# Patient Record
Sex: Female | Born: 1980 | Race: Black or African American | Hispanic: No | Marital: Single | Smoking: Former smoker
Health system: Southern US, Community
[De-identification: ages and names within clinical notes are randomized; demographics above are authoritative.]

## PROBLEM LIST (undated history)

## (undated) DIAGNOSIS — M545 Low back pain, unspecified: Secondary | ICD-10-CM

## (undated) DIAGNOSIS — R600 Localized edema: Secondary | ICD-10-CM

## (undated) DIAGNOSIS — F32A Depression, unspecified: Secondary | ICD-10-CM

## (undated) DIAGNOSIS — F1990 Other psychoactive substance use, unspecified, uncomplicated: Secondary | ICD-10-CM

## (undated) DIAGNOSIS — B019 Varicella without complication: Secondary | ICD-10-CM

## (undated) DIAGNOSIS — F329 Major depressive disorder, single episode, unspecified: Secondary | ICD-10-CM

## (undated) DIAGNOSIS — R0600 Dyspnea, unspecified: Secondary | ICD-10-CM

## (undated) DIAGNOSIS — M1990 Unspecified osteoarthritis, unspecified site: Secondary | ICD-10-CM

## (undated) DIAGNOSIS — I10 Essential (primary) hypertension: Secondary | ICD-10-CM

## (undated) DIAGNOSIS — K219 Gastro-esophageal reflux disease without esophagitis: Secondary | ICD-10-CM

## (undated) HISTORY — DX: Localized edema: R60.0

## (undated) HISTORY — DX: Depression, unspecified: F32.A

## (undated) HISTORY — DX: Essential (primary) hypertension: I10

## (undated) HISTORY — DX: Low back pain: M54.5

## (undated) HISTORY — DX: Low back pain, unspecified: M54.50

## (undated) HISTORY — DX: Unspecified osteoarthritis, unspecified site: M19.90

## (undated) HISTORY — DX: Dyspnea, unspecified: R06.00

## (undated) HISTORY — DX: Gastro-esophageal reflux disease without esophagitis: K21.9

## (undated) HISTORY — DX: Other psychoactive substance use, unspecified, uncomplicated: F19.90

## (undated) HISTORY — DX: Varicella without complication: B01.9

## (undated) HISTORY — DX: Major depressive disorder, single episode, unspecified: F32.9

---

## 2008-02-15 ENCOUNTER — Other Ambulatory Visit: Admission: RE | Admit: 2008-02-15 | Discharge: 2008-02-15 | Payer: Self-pay | Admitting: Family Medicine

## 2012-05-25 ENCOUNTER — Ambulatory Visit (INDEPENDENT_AMBULATORY_CARE_PROVIDER_SITE_OTHER): Payer: Medicare HMO | Admitting: Family Medicine

## 2012-05-25 ENCOUNTER — Encounter: Payer: Self-pay | Admitting: Family Medicine

## 2012-05-25 ENCOUNTER — Encounter: Payer: Self-pay | Admitting: *Deleted

## 2012-05-25 VITALS — BP 120/78 | HR 87 | Temp 98.4°F | Ht 66.0 in | Wt 221.0 lb

## 2012-05-25 DIAGNOSIS — Z0000 Encounter for general adult medical examination without abnormal findings: Secondary | ICD-10-CM | POA: Insufficient documentation

## 2012-05-25 DIAGNOSIS — G5710 Meralgia paresthetica, unspecified lower limb: Secondary | ICD-10-CM | POA: Insufficient documentation

## 2012-05-25 DIAGNOSIS — N920 Excessive and frequent menstruation with regular cycle: Secondary | ICD-10-CM | POA: Insufficient documentation

## 2012-05-25 DIAGNOSIS — G5712 Meralgia paresthetica, left lower limb: Secondary | ICD-10-CM

## 2012-05-25 DIAGNOSIS — Z309 Encounter for contraceptive management, unspecified: Secondary | ICD-10-CM

## 2012-05-25 DIAGNOSIS — B351 Tinea unguium: Secondary | ICD-10-CM

## 2012-05-25 DIAGNOSIS — IMO0001 Reserved for inherently not codable concepts without codable children: Secondary | ICD-10-CM

## 2012-05-25 LAB — CBC WITH DIFFERENTIAL/PLATELET
Basophils Relative: 0.4 % (ref 0.0–3.0)
Eosinophils Relative: 0.5 % (ref 0.0–5.0)
Hemoglobin: 14.2 g/dL (ref 12.0–15.0)
MCV: 90.2 fl (ref 78.0–100.0)
Monocytes Absolute: 0.6 10*3/uL (ref 0.1–1.0)
Neutro Abs: 6.5 10*3/uL (ref 1.4–7.7)
Neutrophils Relative %: 62.6 % (ref 43.0–77.0)
RBC: 4.62 Mil/uL (ref 3.87–5.11)
WBC: 10.4 10*3/uL (ref 4.5–10.5)

## 2012-05-25 LAB — HEPATIC FUNCTION PANEL
ALT: 17 U/L (ref 0–35)
AST: 21 U/L (ref 0–37)
Albumin: 4.2 g/dL (ref 3.5–5.2)

## 2012-05-25 LAB — BASIC METABOLIC PANEL
BUN: 13 mg/dL (ref 6–23)
Chloride: 105 mEq/L (ref 96–112)
Creatinine, Ser: 0.8 mg/dL (ref 0.4–1.2)
GFR: 84.7 mL/min (ref >60.00)
Glucose, Bld: 82 mg/dL (ref 70–99)
Potassium: 3.8 mEq/L (ref 3.5–5.1)

## 2012-05-25 LAB — POCT URINE PREGNANCY: Preg Test, Ur: NEGATIVE

## 2012-05-25 LAB — TSH: TSH: 0.83 u[IU]/mL (ref 0.35–5.50)

## 2012-05-25 MED ORDER — MEDROXYPROGESTERONE ACETATE 150 MG/ML IM SUSP
150.0000 mg | Freq: Once | INTRAMUSCULAR | Status: AC
  Administered 2012-05-25: 150 mg via INTRAMUSCULAR

## 2012-05-25 NOTE — Patient Instructions (Addendum)
Schedule your pap and breast exam at your convenience You're due for your next Depo 6/01-06-24 Take Calcium and Vit D supplement daily (Caltrate) We'll notify you of your lab results and make any changes if needed Restart prenatal vitamins for energy Start using FungiNail as directed on the bottle The numbness of your thigh is called Meralgia Paraesthetica and is due to compression of that nerve on the hip bone Welcome!  We're glad to have you!

## 2012-05-25 NOTE — Progress Notes (Signed)
  Subjective:    Patient ID: Jody Bryan, female    DOB: 06/22/1980, 32 y.o.   MRN: [id number]  HPI New to establish.  Previous MD- none  Here today for CPE.  Menorrhagia- new since giving birth.  Pt reports passing clots.  Denies increased frequency of menses, severe cramping.  Toenail fungus- new.  Pt reports R great toe nail is yellowed, splitting, very brittle.  No other nails involved.  Meralgia paraesthetica- new.  L lateral thigh.  No radiation of numbness.  Intermittent.  Started while pregnant.  Has not improved.     Review of Systems Patient reports no vision/ hearing changes, adenopathy,fever, weight change,  persistant/recurrent hoarseness , swallowing issues, chest pain, palpitations, edema, persistant/recurrent cough, hemoptysis, dyspnea (rest/exertional/paroxysmal nocturnal), gastrointestinal bleeding (melena, rectal bleeding), abdominal pain, significant heartburn, bowel changes, GU symptoms (dysuria, hematuria, incontinence), Gyn symptoms (abnormal  bleeding, pain),  syncope, focal weakness, memory loss, numbness & tingling, skin/hair/nail changes, abnormal bruising or bleeding, anxiety, or depression.     Objective:   Physical Exam General Appearance:    Alert, cooperative, no distress, appears stated age  Head:    Normocephalic, without obvious abnormality, atraumatic  Eyes:    PERRL, conjunctiva/corneas clear, EOM's intact, fundi    benign, both eyes  Ears:    Normal TM's and external ear canals, both ears  Nose:   Nares normal, septum midline, mucosa normal, no drainage    or sinus tenderness  Throat:   Lips, mucosa, and tongue normal; teeth and gums normal  Neck:   Supple, symmetrical, trachea midline, no adenopathy;    Thyroid: no enlargement/tenderness/nodules  Back:     Symmetric, no curvature, ROM normal, no CVA tenderness  Lungs:     Clear to auscultation bilaterally, respirations unlabored  Chest Wall:    No tenderness or deformity   Heart:    Regular  rate and rhythm, S1 and S2 normal, no murmur, rub   or gallop  Breast Exam:    Deferred to GYN  Abdomen:     Soft, non-tender, bowel sounds active all four quadrants,    no masses, no organomegaly  Genitalia:    Deferred to GYN  Rectal:    Extremities:   Extremities normal, atraumatic, no cyanosis or edema  Pulses:   2+ and symmetric all extremities  Skin:   Skin color, texture, turgor normal, no rashes or lesions  Lymph nodes:   Cervical, supraclavicular, and axillary nodes normal  Neurologic:   CNII-XII intact, normal strength, sensation and reflexes    throughout          Assessment & Plan:

## 2012-05-25 NOTE — Assessment & Plan Note (Signed)
New.  Reviewed dx.  Pt to start tx w/ OTC funginail.  Reviewed supportive care and red flags that should prompt return.  Pt expressed understanding and is in agreement w/ plan.

## 2012-05-25 NOTE — Assessment & Plan Note (Signed)
New.  Upreg negative.  Start Depo as pt requests.  Reviewed need for Ca+Vit D supplementation.

## 2012-05-25 NOTE — Assessment & Plan Note (Signed)
Pt's PE WNL w/ exception of obesity.  Check labs.  Anticipatory guidance provided.  
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
## 2012-05-25 NOTE — Assessment & Plan Note (Signed)
New.  Reviewed that this is not uncommon following pregnancy.  Will start Depo and this should improve pt's sxs.  Will follow.

## 2012-05-29 LAB — VITAMIN D 1,25 DIHYDROXY
Vitamin D 1, 25 (OH)2 Total: 76 pg/mL — ABNORMAL HIGH (ref 18–72)
Vitamin D3 1, 25 (OH)2: 76 pg/mL

## 2012-07-27 ENCOUNTER — Telehealth: Payer: Self-pay | Admitting: Family Medicine

## 2012-07-27 NOTE — Telephone Encounter (Signed)
Appointment Scheduled:  07/28/2012 16:00:00  Appointment Scheduled Provider:  Loreen Freud R.  (pt needed appt for 5/29 and this was the only open slot (that wasn't same day))

## 2012-07-27 NOTE — Telephone Encounter (Signed)
Patient Information:  Caller Name: Alexander  Phone: [phone]  Patient: Jody, Bryan  Gender: Female  DOB: 11/26/1980  Age: 32 Years  PCP: Sheliah Hatch  Pregnant: No  Office Follow Up:  Does the office need to follow up with this patient?: No  Instructions For The Office: N/A   Symptoms  Reason For Call & Symptoms: pt reports she has a varicose vein that has burst in her left leg.  pt has pain when it happened  Reviewed Health History In EMR: Yes  Reviewed Medications In EMR: Yes  Reviewed Allergies In EMR: Yes  Reviewed Surgeries / Procedures: Yes  Date of Onset of Symptoms: 07/25/2012 OB / GYN:  LMP: Unknown  Guideline(s) Used:  Leg Pain  Disposition Per Guideline:   See Today or Tomorrow in Office  Reason For Disposition Reached:   Localized pain, redness or hard lump along vein  Advice Given:  Call Back If:  You become worse.  Patient Will Follow Care Advice:  YES  Appointment Scheduled:  07/28/2012 16:00:00 Appointment Scheduled Provider:  Loreen Freud R.  (pt needed appt for 5/29 and this was the only open slot (that wasn't same day))

## 2012-07-28 ENCOUNTER — Ambulatory Visit (HOSPITAL_BASED_OUTPATIENT_CLINIC_OR_DEPARTMENT_OTHER)
Admission: RE | Admit: 2012-07-28 | Discharge: 2012-07-28 | Disposition: A | Discharge status: Home / Self Care | Payer: Managed Care, Other (non HMO) | Source: Ambulatory Visit | Attending: Family Medicine | Admitting: Family Medicine

## 2012-07-28 ENCOUNTER — Encounter: Payer: Self-pay | Admitting: Family Medicine

## 2012-07-28 ENCOUNTER — Ambulatory Visit (INDEPENDENT_AMBULATORY_CARE_PROVIDER_SITE_OTHER): Payer: Managed Care, Other (non HMO) | Admitting: Family Medicine

## 2012-07-28 VITALS — BP 120/80 | HR 87 | Temp 98.9°F | Wt 228.8 lb

## 2012-07-28 DIAGNOSIS — M79609 Pain in unspecified limb: Secondary | ICD-10-CM | POA: Insufficient documentation

## 2012-07-28 DIAGNOSIS — M79662 Pain in left lower leg: Secondary | ICD-10-CM

## 2012-07-28 DIAGNOSIS — I8390 Asymptomatic varicose veins of unspecified lower extremity: Secondary | ICD-10-CM | POA: Insufficient documentation

## 2012-07-28 NOTE — Patient Instructions (Signed)
Varicose Veins  Varicose veins are veins that have become enlarged and twisted.  CAUSES  This condition is the result of valves in the veins not working properly. Valves in the veins help return blood from the leg to the heart. If these valves are damaged, blood flows backwards and backs up into the veins in the leg near the skin. This causes the veins to become larger. People who are on their feet a lot, who are pregnant, or who are overweight are more likely to develop varicose veins.  SYMPTOMS   · Bulging, twisted-appearing, bluish veins, most commonly found on the legs.  · Leg pain or a feeling of heaviness. These symptoms may be worse at the end of the day.  · Leg swelling.  · Skin color changes.  DIAGNOSIS   Varicose veins can usually be diagnosed with an exam of your legs by your caregiver. He or she may recommend an ultrasound of your leg veins.  TREATMENT   Most varicose veins can be treated at home. However, other treatments are available for people who have persistent symptoms or who want to treat the cosmetic appearance of the varicose veins. These include:  · Laser treatment of very small varicose veins.  · Medicine that is shot (injected) into the vein. This medicine hardens the walls of the vein and closes off the vein. This treatment is called sclerotherapy. Afterwards, you may need to wear clothing or bandages that apply pressure.  · Surgery.  HOME CARE INSTRUCTIONS   · Do not stand or sit in one position for long periods of time. Do not sit with your legs crossed. Rest with your legs raised during the day.  · Wear elastic stockings or support hose. Do not wear other tight, encircling garments around the legs, pelvis, or waist.  · Walk as much as possible to increase blood flow.  · Raise the foot of your bed at night with 2-inch blocks.  · If you get a cut in the skin over the vein and the vein bleeds, lie down with your leg raised and press on it with a clean cloth until the bleeding stops. Then  place a bandage (dressing) on the cut. See your caregiver if it continues to bleed or needs stitches.  SEEK MEDICAL CARE IF:   · The skin around your ankle starts to break down.  · You have pain, redness, tenderness, or hard swelling developing in your leg over a vein.  · You are uncomfortable due to leg pain.  Document Released: 11/26/2004 Document Revised: 05/11/2011 Document Reviewed: 04/14/2010  [employer]® Patient Information ©2014 [employer].

## 2012-07-28 NOTE — Assessment & Plan Note (Signed)
Check doppler Elevate leg Warm compress

## 2012-07-28 NOTE — Progress Notes (Signed)
  Subjective:    Patient ID: Jody Bryan, female    DOB: 1980/08/15, 32 y.o.   MRN: [id number]  HPI Pt here c/o pain in L calf and ruptured blood vesssel.  No cp, no sob.    Review of Systems    as above  Objective:   Physical Exam  BP 120/80  Pulse 87  Temp(Src) 98.9 F (37.2 C) (Oral)  Wt 228 lb 12.8 oz (103.783 kg)  BMI 36.95 kg/m2  SpO2 98% General appearance: alert, cooperative, appears stated age and no distress Lungs: clear to auscultation bilaterally Heart: S1, S2 normal Extremities: no edema,  + echymosis l calf with some tenderness, no errythema       Assessment & Plan:

## 2012-08-01 ENCOUNTER — Telehealth: Payer: Self-pay | Admitting: Family Medicine

## 2012-08-01 NOTE — Telephone Encounter (Signed)
Patient is calling because she states she is returning a call from someone in our office on Friday. She says that it could have been in regards to her [name] that we sent her for but she is unsure.

## 2012-08-02 NOTE — Telephone Encounter (Signed)
Spoke with the pt and informed her of recent US results.  Pt understood.//AB/CMA

## 2012-08-15 ENCOUNTER — Ambulatory Visit: Payer: Managed Care, Other (non HMO) | Admitting: Family Medicine

## 2012-08-15 DIAGNOSIS — Z0289 Encounter for other administrative examinations: Secondary | ICD-10-CM

## 2012-08-23 ENCOUNTER — Ambulatory Visit (INDEPENDENT_AMBULATORY_CARE_PROVIDER_SITE_OTHER): Payer: Managed Care, Other (non HMO)

## 2012-08-23 DIAGNOSIS — Z309 Encounter for contraceptive management, unspecified: Secondary | ICD-10-CM

## 2012-08-23 MED ORDER — METHYLPREDNISOLONE ACETATE 80 MG/ML IJ SUSP: 80.0000 mg | Freq: Once | INTRAMUSCULAR | Status: DC

## 2012-08-24 MED ORDER — MEDROXYPROGESTERONE ACETATE 150 MG/ML IM SUSP
150.0000 mg | Freq: Once | INTRAMUSCULAR | Status: AC
  Administered 2012-08-23: 150 mg via INTRAMUSCULAR

## 2012-08-24 NOTE — Addendum Note (Signed)
Addended by: Arnette Norris on: 08/24/2012 08:06 AM   Modules accepted: Orders

## 2012-09-05 ENCOUNTER — Other Ambulatory Visit (HOSPITAL_COMMUNITY)
Admission: RE | Admit: 2012-09-05 | Discharge: 2012-09-05 | Disposition: A | Discharge status: Home / Self Care | Payer: Managed Care, Other (non HMO) | Source: Ambulatory Visit | Attending: Family Medicine | Admitting: Family Medicine

## 2012-09-05 ENCOUNTER — Ambulatory Visit (INDEPENDENT_AMBULATORY_CARE_PROVIDER_SITE_OTHER): Payer: Managed Care, Other (non HMO) | Admitting: Family Medicine

## 2012-09-05 ENCOUNTER — Encounter: Payer: Self-pay | Admitting: Family Medicine

## 2012-09-05 VITALS — BP 110/80 | HR 88 | Temp 98.6°F | Ht 66.0 in | Wt 234.0 lb

## 2012-09-05 DIAGNOSIS — M25531 Pain in right wrist: Secondary | ICD-10-CM | POA: Insufficient documentation

## 2012-09-05 DIAGNOSIS — Z124 Encounter for screening for malignant neoplasm of cervix: Secondary | ICD-10-CM

## 2012-09-05 DIAGNOSIS — Z01419 Encounter for gynecological examination (general) (routine) without abnormal findings: Secondary | ICD-10-CM | POA: Insufficient documentation

## 2012-09-05 DIAGNOSIS — Z1151 Encounter for screening for human papillomavirus (HPV): Secondary | ICD-10-CM | POA: Insufficient documentation

## 2012-09-05 DIAGNOSIS — M25539 Pain in unspecified wrist: Secondary | ICD-10-CM

## 2012-09-05 NOTE — Assessment & Plan Note (Signed)
New.  Suspect overuse injury.  Recommended wrist brace during computer use, ice, NSAIDs.  Reviewed supportive care and red flags that should prompt return.  Pt expressed understanding and is in agreement w/ plan.

## 2012-09-05 NOTE — Patient Instructions (Addendum)
Schedule your complete physical in 1 year Start ibuprofen or aleve for your wrist pain Get a wrist brace over the counter to keep it in a neutral position while doing computer work ICE if needed Call with any questions or concerns Have a great summer!

## 2012-09-05 NOTE — Progress Notes (Signed)
  Subjective:    Patient ID: Jody Bryan, female    DOB: 27-Apr-1980, 32 y.o.   MRN: [id number]  HPI GYN exam- due today for breast exam and pap.  No concerns for sexually transmitted infections.  No breast pain, drainage, no lumps, skin changes.  No vaginal discharge.  R wrist pain- pt does a lot of computer work and uses the mouse w/ her R hand.  Now having pain along extensor surface of wrist, particularly w/ wrist flexion.  No swelling, no known injury.  Intermittent pain x1-2 months.  Improves w/ rest, immobilization.  Review of Systems For ROS see HPI     Objective:   Physical Exam  Vitals reviewed. Constitutional: She appears well-developed and well-nourished. No distress.  Pulmonary/Chest: Right breast exhibits no inverted nipple, no mass, no nipple discharge, no skin change and no tenderness. Left breast exhibits no inverted nipple, no mass, no nipple discharge, no skin change and no tenderness.  Genitourinary: There is no rash, tenderness or lesion on the right labia. There is no rash, tenderness or lesion on the left labia. Uterus is not deviated, not enlarged, not fixed and not tender. Cervix exhibits no motion tenderness, no discharge and no friability. Right adnexum displays no mass, no tenderness and no fullness. Left adnexum displays no mass, no tenderness and no fullness. No erythema, tenderness or bleeding around the vagina. No foreign body around the vagina. No signs of injury around the vagina. No vaginal discharge found.          Assessment & Plan:

## 2012-09-05 NOTE — Assessment & Plan Note (Signed)
Pt's GYN exam WNL.  Pap collected.

## 2012-09-08 ENCOUNTER — Telehealth: Payer: Self-pay

## 2012-09-08 NOTE — Telephone Encounter (Signed)
Normal PAP letter sent. RF/RN

## 2012-11-21 ENCOUNTER — Ambulatory Visit (INDEPENDENT_AMBULATORY_CARE_PROVIDER_SITE_OTHER): Payer: Managed Care, Other (non HMO)

## 2012-11-21 DIAGNOSIS — Z309 Encounter for contraceptive management, unspecified: Secondary | ICD-10-CM

## 2012-11-21 MED ORDER — MEDROXYPROGESTERONE ACETATE 150 MG/ML IM SUSP
150.0000 mg | Freq: Once | INTRAMUSCULAR | Status: AC
  Administered 2012-11-21: 150 mg via INTRAMUSCULAR

## 2013-02-15 ENCOUNTER — Ambulatory Visit (INDEPENDENT_AMBULATORY_CARE_PROVIDER_SITE_OTHER): Payer: Managed Care, Other (non HMO) | Admitting: *Deleted

## 2013-02-15 DIAGNOSIS — Z3009 Encounter for other general counseling and advice on contraception: Secondary | ICD-10-CM

## 2013-02-15 MED ORDER — MEDROXYPROGESTERONE ACETATE 150 MG/ML IM SUSP
150.0000 mg | Freq: Once | INTRAMUSCULAR | Status: AC
  Administered 2013-02-15: 150 mg via INTRAMUSCULAR

## 2013-05-03 ENCOUNTER — Ambulatory Visit (INDEPENDENT_AMBULATORY_CARE_PROVIDER_SITE_OTHER): Payer: Managed Care, Other (non HMO)

## 2013-05-03 DIAGNOSIS — Z309 Encounter for contraceptive management, unspecified: Secondary | ICD-10-CM

## 2013-05-03 MED ORDER — MEDROXYPROGESTERONE ACETATE 150 MG/ML IM SUSP
150.0000 mg | Freq: Once | INTRAMUSCULAR | Status: AC
  Administered 2013-05-03: 150 mg via INTRAMUSCULAR

## 2013-07-07 ENCOUNTER — Encounter (HOSPITAL_COMMUNITY): Payer: Self-pay | Admitting: Emergency Medicine

## 2013-07-07 ENCOUNTER — Emergency Department (HOSPITAL_COMMUNITY): Payer: Managed Care, Other (non HMO)

## 2013-07-07 ENCOUNTER — Emergency Department (HOSPITAL_COMMUNITY)
Admission: EM | Admit: 2013-07-07 | Discharge: 2013-07-07 | Disposition: A | Discharge status: Home / Self Care | Payer: Managed Care, Other (non HMO) | Attending: Emergency Medicine | Admitting: Emergency Medicine

## 2013-07-07 ENCOUNTER — Telehealth: Payer: Self-pay

## 2013-07-07 ENCOUNTER — Telehealth: Payer: Self-pay | Admitting: Family Medicine

## 2013-07-07 DIAGNOSIS — R519 Headache, unspecified: Secondary | ICD-10-CM

## 2013-07-07 DIAGNOSIS — Y9389 Activity, other specified: Secondary | ICD-10-CM | POA: Insufficient documentation

## 2013-07-07 DIAGNOSIS — S79929A Unspecified injury of unspecified thigh, initial encounter: Secondary | ICD-10-CM | POA: Diagnosis not present

## 2013-07-07 DIAGNOSIS — Z8619 Personal history of other infectious and parasitic diseases: Secondary | ICD-10-CM | POA: Diagnosis not present

## 2013-07-07 DIAGNOSIS — IMO0002 Reserved for concepts with insufficient information to code with codable children: Secondary | ICD-10-CM | POA: Diagnosis not present

## 2013-07-07 DIAGNOSIS — S298XXA Other specified injuries of thorax, initial encounter: Secondary | ICD-10-CM | POA: Insufficient documentation

## 2013-07-07 DIAGNOSIS — S79919A Unspecified injury of unspecified hip, initial encounter: Secondary | ICD-10-CM | POA: Insufficient documentation

## 2013-07-07 DIAGNOSIS — Y92410 Unspecified street and highway as the place of occurrence of the external cause: Secondary | ICD-10-CM | POA: Diagnosis not present

## 2013-07-07 DIAGNOSIS — R51 Headache: Secondary | ICD-10-CM

## 2013-07-07 DIAGNOSIS — S0990XA Unspecified injury of head, initial encounter: Secondary | ICD-10-CM | POA: Diagnosis present

## 2013-07-07 DIAGNOSIS — M542 Cervicalgia: Secondary | ICD-10-CM

## 2013-07-07 MED ORDER — OXYCODONE-ACETAMINOPHEN 5-325 MG PO TABS: 1.0000 | ORAL_TABLET | ORAL | Status: DC | PRN
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
## 2013-07-07 NOTE — Discharge Instructions (Signed)
Take the prescribed medication as directed.  You will likely continue to be sore for the next several days which is normal. Follow-up with your primary care physician if problems occur. Return to the ED for new or worsening symptoms-- trouble breathing, chest pain, racing heart, etc.
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
## 2013-07-07 NOTE — ED Provider Notes (Signed)
Medical screening examination/treatment/procedure(s) were performed by non-physician practitioner and as supervising physician I was immediately available for consultation/collaboration.   EKG Interpretation None        David H Yao, MD 07/07/13 1500 
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
## 2013-07-07 NOTE — Telephone Encounter (Signed)
Agree w/ advice to go to ER immediately!  Pt could have brain bleed causing nausea and headache
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
## 2013-07-17 ENCOUNTER — Encounter: Payer: Self-pay | Admitting: Family Medicine

## 2013-07-17 ENCOUNTER — Ambulatory Visit (INDEPENDENT_AMBULATORY_CARE_PROVIDER_SITE_OTHER): Payer: Managed Care, Other (non HMO) | Admitting: Family Medicine

## 2013-07-17 VITALS — BP 122/80 | HR 89 | Temp 98.2°F | Resp 16 | Wt 236.1 lb

## 2013-07-17 DIAGNOSIS — M62838 Other muscle spasm: Secondary | ICD-10-CM | POA: Insufficient documentation

## 2013-07-17 DIAGNOSIS — IMO0001 Reserved for inherently not codable concepts without codable children: Secondary | ICD-10-CM

## 2013-07-17 DIAGNOSIS — Z309 Encounter for contraceptive management, unspecified: Secondary | ICD-10-CM

## 2013-07-17 MED ORDER — CYCLOBENZAPRINE HCL 10 MG PO TABS: 10.0000 mg | ORAL_TABLET | Freq: Three times a day (TID) | ORAL | Status: DC | PRN

## 2013-07-17 MED ORDER — MEDROXYPROGESTERONE ACETATE 150 MG/ML IM SUSP
150.0000 mg | Freq: Once | INTRAMUSCULAR | Status: AC
  Administered 2013-07-17: 150 mg via INTRAMUSCULAR

## 2013-07-17 MED ORDER — MELOXICAM 15 MG PO TABS
15.0000 mg | ORAL_TABLET | Freq: Every day | ORAL | Status: DC
Start: 2013-07-17 — End: 2014-06-08

## 2013-07-17 NOTE — Assessment & Plan Note (Signed)
New to provider.  Reviewed xray and report from ER.  Pt has completed pain meds but did not take NSAIDs or muscle relaxers.  Start both.  Reviewed supportive care and red flags that should prompt return.  Pt expressed understanding and is in agreement w/ plan.

## 2013-07-17 NOTE — Assessment & Plan Note (Signed)
New.  NSAIDs and flexeril prn.  Heat.  Reviewed supportive care and red flags that should prompt return.  Pt expressed understanding and is in agreement w/ plan.

## 2013-07-17 NOTE — Progress Notes (Signed)
Pre visit review using our clinic review tool, if applicable. No additional management support is needed unless otherwise documented below in the visit note. 

## 2013-07-17 NOTE — Assessment & Plan Note (Signed)
Pt counseled on possibility of breakthrough bleeding w/ Depo dose 2 days early.  Pt aware of this and willing to accept this.  injxn given.
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
## 2013-07-17 NOTE — Patient Instructions (Signed)
Schedule nurse visit for your next Depo between 8/3-8/17 Start the Mobic daily- take w/ food- for inflammation Use the Flexeril at night prior to bed- will cause drowsiness (this is for muscle spasm) HEAT! Do some gentle stretching to avoid stiffness Call with any questions or concerns Hang in there!

## 2013-07-18 ENCOUNTER — Telehealth: Payer: Self-pay | Admitting: Family Medicine
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
## 2013-07-18 NOTE — Telephone Encounter (Signed)
Letter written and pt notified it was available at front desk for pick up.

## 2013-10-04 ENCOUNTER — Ambulatory Visit (INDEPENDENT_AMBULATORY_CARE_PROVIDER_SITE_OTHER): Payer: Managed Care, Other (non HMO)

## 2013-10-04 DIAGNOSIS — Z309 Encounter for contraceptive management, unspecified: Secondary | ICD-10-CM

## 2013-10-04 MED ORDER — MEDROXYPROGESTERONE ACETATE 150 MG/ML IM SUSP
150.0000 mg | Freq: Once | INTRAMUSCULAR | Status: AC
  Administered 2013-10-04: 150 mg via INTRAMUSCULAR

## 2014-01-10 ENCOUNTER — Ambulatory Visit (INDEPENDENT_AMBULATORY_CARE_PROVIDER_SITE_OTHER): Payer: Managed Care, Other (non HMO)

## 2014-01-10 ENCOUNTER — Ambulatory Visit: Payer: Managed Care, Other (non HMO)

## 2014-01-10 DIAGNOSIS — Z3042 Encounter for surveillance of injectable contraceptive: Secondary | ICD-10-CM

## 2014-01-10 DIAGNOSIS — Z3049 Encounter for surveillance of other contraceptives: Secondary | ICD-10-CM

## 2014-01-10 LAB — POCT URINE PREGNANCY: PREG TEST UR: NEGATIVE

## 2014-01-10 MED ORDER — MEDROXYPROGESTERONE ACETATE 150 MG/ML IM SUSP
150.0000 mg | Freq: Once | INTRAMUSCULAR | Status: AC
  Administered 2014-01-10: 150 mg via INTRAMUSCULAR

## 2014-04-18 ENCOUNTER — Ambulatory Visit (INDEPENDENT_AMBULATORY_CARE_PROVIDER_SITE_OTHER): Payer: Managed Care, Other (non HMO) | Admitting: *Deleted

## 2014-04-18 DIAGNOSIS — Z3042 Encounter for surveillance of injectable contraceptive: Secondary | ICD-10-CM

## 2014-04-18 LAB — POCT URINE PREGNANCY: PREG TEST UR: NEGATIVE

## 2014-04-18 MED ORDER — MEDROXYPROGESTERONE ACETATE 150 MG/ML IM SUSP
150.0000 mg | Freq: Once | INTRAMUSCULAR | Status: AC
Start: 2014-04-18 — End: 2014-04-18
  Administered 2014-04-18: 150 mg via INTRAMUSCULAR

## 2014-04-18 NOTE — Progress Notes (Signed)
Pre visit review using our clinic review tool, if applicable. No additional management support is needed unless otherwise documented below in the visit note.  Patient tolerated injection well.  

## 2014-05-17 ENCOUNTER — Ambulatory Visit: Payer: Managed Care, Other (non HMO)

## 2014-05-24 ENCOUNTER — Ambulatory Visit: Payer: Managed Care, Other (non HMO)

## 2014-05-31 ENCOUNTER — Ambulatory Visit: Payer: Managed Care, Other (non HMO)

## 2014-06-01 ENCOUNTER — Telehealth: Payer: Self-pay | Admitting: Family Medicine

## 2014-06-01 NOTE — Telephone Encounter (Signed)
Caller name: Tomika Relation to pt: self Call back number:[phone] Pharmacy:  Reason for call:   Patient is upset that she has to wait until early august to get in for a cpe. We have had to reschedule her June cpe. She wants to know if we can get her in any sooner. She also states that she would like to transfer over to Dr. Laury AxonLowne.

## 2014-06-01 NOTE — Telephone Encounter (Signed)
Switching is ok w/ me if approved by Dr Laury AxonLowne. If that's the case, she can cancel the physical w/ me and schedule w/ Dr Laury AxonLowne

## 2014-06-01 NOTE — Telephone Encounter (Signed)
Ok with me 

## 2014-06-04 NOTE — Telephone Encounter (Signed)
Left detailed message informing patient of this and to call and schedule °

## 2014-06-07 ENCOUNTER — Ambulatory Visit: Payer: Managed Care, Other (non HMO)

## 2014-06-08 ENCOUNTER — Other Ambulatory Visit (HOSPITAL_COMMUNITY)
Admission: RE | Admit: 2014-06-08 | Discharge: 2014-06-08 | Disposition: A | Discharge status: Home / Self Care | Payer: Managed Care, Other (non HMO) | Source: Ambulatory Visit | Attending: Family Medicine | Admitting: Family Medicine

## 2014-06-08 ENCOUNTER — Encounter: Payer: Self-pay | Admitting: Family Medicine

## 2014-06-08 ENCOUNTER — Ambulatory Visit (INDEPENDENT_AMBULATORY_CARE_PROVIDER_SITE_OTHER): Payer: Managed Care, Other (non HMO) | Admitting: Family Medicine

## 2014-06-08 VITALS — BP 122/80 | HR 77 | Temp 98.5°F | Ht 66.0 in | Wt 230.0 lb

## 2014-06-08 DIAGNOSIS — B353 Tinea pedis: Secondary | ICD-10-CM | POA: Diagnosis not present

## 2014-06-08 DIAGNOSIS — Z0000 Encounter for general adult medical examination without abnormal findings: Secondary | ICD-10-CM | POA: Diagnosis not present

## 2014-06-08 DIAGNOSIS — Z01419 Encounter for gynecological examination (general) (routine) without abnormal findings: Secondary | ICD-10-CM | POA: Diagnosis present

## 2014-06-08 LAB — HEPATIC FUNCTION PANEL
ALK PHOS: 69 U/L (ref 39–117)
ALT: 18 U/L (ref 0–35)
AST: 16 U/L (ref 0–37)
Albumin: 4.1 g/dL (ref 3.5–5.2)
BILIRUBIN DIRECT: 0.2 mg/dL (ref 0.0–0.3)
BILIRUBIN TOTAL: 0.8 mg/dL (ref 0.2–1.2)
Indirect Bilirubin: 0.6 mg/dL (ref 0.2–1.2)
Total Protein: 6.7 g/dL (ref 6.0–8.3)

## 2014-06-08 LAB — LIPID PANEL
CHOL/HDL RATIO: 3.5 ratio
Cholesterol: 150 mg/dL (ref 0–200)
HDL: 43 mg/dL — ABNORMAL LOW (ref >=46)
LDL Cholesterol: 97 mg/dL (ref 0–99)
Triglycerides: 49 mg/dL (ref <150)
VLDL: 10 mg/dL (ref 0–40)

## 2014-06-08 LAB — CBC WITH DIFFERENTIAL/PLATELET
BASOS PCT: 0 % (ref 0–1)
Basophils Absolute: 0 10*3/uL (ref 0.0–0.1)
EOS PCT: 1 % (ref 0–5)
Eosinophils Absolute: 0.1 10*3/uL (ref 0.0–0.7)
HCT: 40.4 % (ref 36.0–46.0)
Hemoglobin: 13.7 g/dL (ref 12.0–15.0)
Lymphocytes Relative: 35 % (ref 12–46)
Lymphs Abs: 4 10*3/uL (ref 0.7–4.0)
MCH: 30.1 pg (ref 26.0–34.0)
MCHC: 33.9 g/dL (ref 30.0–36.0)
MCV: 88.8 fL (ref 78.0–100.0)
MONO ABS: 0.7 10*3/uL (ref 0.1–1.0)
MONOS PCT: 6 % (ref 3–12)
MPV: 9 fL (ref 8.6–12.4)
Neutro Abs: 6.6 10*3/uL (ref 1.7–7.7)
Neutrophils Relative %: 58 % (ref 43–77)
PLATELETS: 317 10*3/uL (ref 150–400)
RBC: 4.55 MIL/uL (ref 3.87–5.11)
RDW: 14.6 % (ref 11.5–15.5)
WBC: 11.3 10*3/uL — ABNORMAL HIGH (ref 4.0–10.5)

## 2014-06-08 LAB — BASIC METABOLIC PANEL
BUN: 15 mg/dL (ref 6–23)
CO2: 21 meq/L (ref 19–32)
Calcium: 9.3 mg/dL (ref 8.4–10.5)
Chloride: 109 mEq/L (ref 96–112)
Creat: 0.82 mg/dL (ref 0.50–1.10)
Glucose, Bld: 74 mg/dL (ref 70–99)
Potassium: 4.1 mEq/L (ref 3.5–5.3)
SODIUM: 141 meq/L (ref 135–145)

## 2014-06-08 LAB — TSH: TSH: 1.68 u[IU]/mL (ref 0.350–4.500)

## 2014-06-08 MED ORDER — MELOXICAM 15 MG PO TABS: 15.0000 mg | ORAL_TABLET | Freq: Every day | ORAL | Status: DC

## 2014-06-08 MED ORDER — CLOTRIMAZOLE-BETAMETHASONE 1-0.05 % EX CREA: 1.0000 "application " | TOPICAL_CREAM | Freq: Two times a day (BID) | CUTANEOUS | Status: DC

## 2014-06-08 NOTE — Patient Instructions (Addendum)
Preventive Care for Adults A healthy lifestyle and preventive care can promote health and wellness. Preventive health guidelines for women include the following key practices.  A routine yearly physical is a good way to check with your health care provider about your health and preventive screening. It is a chance to share any concerns and updates on your health and to receive a thorough exam.  Visit your dentist for a routine exam and preventive care every 6 months. Brush your teeth twice a day and floss once a day. Good oral hygiene prevents tooth decay and gum disease.  The frequency of eye exams is based on your age, health, family medical history, use of contact lenses, and other factors. Follow your health care provider's recommendations for frequency of eye exams.  Eat a healthy diet. Foods like vegetables, fruits, whole grains, low-fat dairy products, and lean protein foods contain the nutrients you need without too many calories. Decrease your intake of foods high in solid fats, added sugars, and salt. Eat the right amount of calories for you.Get information about a proper diet from your health care provider, if necessary.  Regular physical exercise is one of the most important things you can do for your health. Most adults should get at least 150 minutes of moderate-intensity exercise (any activity that increases your heart rate and causes you to sweat) each week. In addition, most adults need muscle-strengthening exercises on 2 or more days a week.  Maintain a healthy weight. The body mass index (BMI) is a screening tool to identify possible weight problems. It provides an estimate of body fat based on height and weight. Your health care provider can find your BMI and can help you achieve or maintain a healthy weight.For adults 20 years and older:  A BMI below 18.5 is considered underweight.  A BMI of 18.5 to 24.9 is normal.  A BMI of 25 to 29.9 is considered overweight.  A BMI of  30 and above is considered obese.  Maintain normal blood lipids and cholesterol levels by exercising and minimizing your intake of saturated fat. Eat a balanced diet with plenty of fruit and vegetables. Blood tests for lipids and cholesterol should begin at age 76 and be repeated every 5 years. If your lipid or cholesterol levels are high, you are over 50, or you are at high risk for heart disease, you may need your cholesterol levels checked more frequently.Ongoing high lipid and cholesterol levels should be treated with medicines if diet and exercise are not working.  If you smoke, find out from your health care provider how to quit. If you do not use tobacco, do not start.  Lung cancer screening is recommended for adults aged 22-80 years who are at high risk for developing lung cancer because of a history of smoking. A yearly low-dose CT scan of the lungs is recommended for people who have at least a 30-pack-year history of smoking and are a current smoker or have quit within the past 15 years. A pack year of smoking is smoking an average of 1 pack of cigarettes a day for 1 year (for example: 1 pack a day for 30 years or 2 packs a day for 15 years). Yearly screening should continue until the smoker has stopped smoking for at least 15 years. Yearly screening should be stopped for people who develop a health problem that would prevent them from having lung cancer treatment.  If you are pregnant, do not drink alcohol. If you are breastfeeding,  be very cautious about drinking alcohol. If you are not pregnant and choose to drink alcohol, do not have more than 1 drink per day. One drink is considered to be 12 ounces (355 mL) of beer, 5 ounces (148 mL) of wine, or 1.5 ounces (44 mL) of liquor.  Avoid use of street drugs. Do not share needles with anyone. Ask for help if you need support or instructions about stopping the use of drugs.  High blood pressure causes heart disease and increases the risk of  stroke. Your blood pressure should be checked at least every 1 to 2 years. Ongoing high blood pressure should be treated with medicines if weight loss and exercise do not work.  If you are 75-52 years old, ask your health care provider if you should take aspirin to prevent strokes.  Diabetes screening involves taking a blood sample to check your fasting blood sugar level. This should be done once every 3 years, after age 15, if you are within normal weight and without risk factors for diabetes. Testing should be considered at a younger age or be carried out more frequently if you are overweight and have at least 1 risk factor for diabetes.  Breast cancer screening is essential preventive care for women. You should practice "breast self-awareness." This means understanding the normal appearance and feel of your breasts and may include breast self-examination. Any changes detected, no matter how small, should be reported to a health care provider. Women in their 58s and 30s should have a clinical breast exam (CBE) by a health care provider as part of a regular health exam every 1 to 3 years. After age 16, women should have a CBE every year. Starting at age 53, women should consider having a mammogram (breast X-ray test) every year. Women who have a family history of breast cancer should talk to their health care provider about genetic screening. Women at a high risk of breast cancer should talk to their health care providers about having an MRI and a mammogram every year.  Breast cancer gene (BRCA)-related cancer risk assessment is recommended for women who have family members with BRCA-related cancers. BRCA-related cancers include breast, ovarian, tubal, and peritoneal cancers. Having family members with these cancers may be associated with an increased risk for harmful changes (mutations) in the breast cancer genes BRCA1 and BRCA2. Results of the assessment will determine the need for genetic counseling and  BRCA1 and BRCA2 testing.  Routine pelvic exams to screen for cancer are no longer recommended for nonpregnant women who are considered low risk for cancer of the pelvic organs (ovaries, uterus, and vagina) and who do not have symptoms. Ask your health care provider if a screening pelvic exam is right for you.  If you have had past treatment for cervical cancer or a condition that could lead to cancer, you need Pap tests and screening for cancer for at least 20 years after your treatment. If Pap tests have been discontinued, your risk factors (such as having a new sexual partner) need to be reassessed to determine if screening should be resumed. Some women have medical problems that increase the chance of getting cervical cancer. In these cases, your health care provider may recommend more frequent screening and Pap tests.  The HPV test is an additional test that may be used for cervical cancer screening. The HPV test looks for the virus that can cause the cell changes on the cervix. The cells collected during the Pap test can be  tested for HPV. The HPV test could be used to screen women aged 30 years and older, and should be used in women of any age who have unclear Pap test results. After the age of 30, women should have HPV testing at the same frequency as a Pap test.  Colorectal cancer can be detected and often prevented. Most routine colorectal cancer screening begins at the age of 50 years and continues through age 75 years. However, your health care provider may recommend screening at an earlier age if you have risk factors for colon cancer. On a yearly basis, your health care provider may provide home test kits to check for hidden blood in the stool. Use of a small camera at the end of a tube, to directly examine the colon (sigmoidoscopy or colonoscopy), can detect the earliest forms of colorectal cancer. Talk to your health care provider about this at age 50, when routine screening begins. Direct  exam of the colon should be repeated every 5-10 years through age 75 years, unless early forms of pre-cancerous polyps or small growths are found.  People who are at an increased risk for hepatitis B should be screened for this virus. You are considered at high risk for hepatitis B if:  You were born in a country where hepatitis B occurs often. Talk with your health care provider about which countries are considered high risk.  Your parents were born in a high-risk country and you have not received a shot to protect against hepatitis B (hepatitis B vaccine).  You have HIV or AIDS.  You use needles to inject street drugs.  You live with, or have sex with, someone who has hepatitis B.  You get hemodialysis treatment.  You take certain medicines for conditions like cancer, organ transplantation, and autoimmune conditions.  Hepatitis C blood testing is recommended for all people born from 1945 through 1965 and any individual with known risks for hepatitis C.  Practice safe sex. Use condoms and avoid high-risk sexual practices to reduce the spread of sexually transmitted infections (STIs). STIs include gonorrhea, chlamydia, syphilis, trichomonas, herpes, HPV, and human immunodeficiency virus (HIV). Herpes, HIV, and HPV are viral illnesses that have no cure. They can result in disability, cancer, and death.  You should be screened for sexually transmitted illnesses (STIs) including gonorrhea and chlamydia if:  You are sexually active and are younger than 24 years.  You are older than 24 years and your health care provider tells you that you are at risk for this type of infection.  Your sexual activity has changed since you were last screened and you are at an increased risk for chlamydia or gonorrhea. Ask your health care provider if you are at risk.  If you are at risk of being infected with HIV, it is recommended that you take a prescription medicine daily to prevent HIV infection. This is  called preexposure prophylaxis (PrEP). You are considered at risk if:  You are a heterosexual woman, are sexually active, and are at increased risk for HIV infection.  You take drugs by injection.  You are sexually active with a partner who has HIV.  Talk with your health care provider about whether you are at high risk of being infected with HIV. If you choose to begin PrEP, you should first be tested for HIV. You should then be tested every 3 months for as long as you are taking PrEP.  Osteoporosis is a disease in which the bones lose minerals and strength   with aging. This can result in serious bone fractures or breaks. The risk of osteoporosis can be identified using a bone density scan. Women ages 65 years and over and women at risk for fractures or osteoporosis should discuss screening with their health care providers. Ask your health care provider whether you should take a calcium supplement or vitamin D to reduce the rate of osteoporosis.  Menopause can be associated with physical symptoms and risks. Hormone replacement therapy is available to decrease symptoms and risks. You should talk to your health care provider about whether hormone replacement therapy is right for you.  Use sunscreen. Apply sunscreen liberally and repeatedly throughout the day. You should seek shade when your shadow is shorter than you. Protect yourself by wearing long sleeves, pants, a wide-brimmed hat, and sunglasses year round, whenever you are outdoors.  Once a month, do a whole body skin exam, using a mirror to look at the skin on your back. Tell your health care provider of new moles, moles that have irregular borders, moles that are larger than a pencil eraser, or moles that have changed in shape or color.  Stay current with required vaccines (immunizations).  Influenza vaccine. All adults should be immunized every year.  Tetanus, diphtheria, and acellular pertussis (Td, Tdap) vaccine. Pregnant women should  receive 1 dose of Tdap vaccine during each pregnancy. The dose should be obtained regardless of the length of time since the last dose. Immunization is preferred during the 27th-36th week of gestation. An adult who has not previously received Tdap or who does not know her vaccine status should receive 1 dose of Tdap. This initial dose should be followed by tetanus and diphtheria toxoids (Td) booster doses every 10 years. Adults with an unknown or incomplete history of completing a 3-dose immunization series with Td-containing vaccines should begin or complete a primary immunization series including a Tdap dose. Adults should receive a Td booster every 10 years.  Varicella vaccine. An adult without evidence of immunity to varicella should receive 2 doses or a second dose if she has previously received 1 dose. Pregnant females who do not have evidence of immunity should receive the first dose after pregnancy. This first dose should be obtained before leaving the health care facility. The second dose should be obtained 4-8 weeks after the first dose.  Human papillomavirus (HPV) vaccine. Females aged 13-26 years who have not received the vaccine previously should obtain the 3-dose series. The vaccine is not recommended for use in pregnant females. However, pregnancy testing is not needed before receiving a dose. If a female is found to be pregnant after receiving a dose, no treatment is needed. In that case, the remaining doses should be delayed until after the pregnancy. Immunization is recommended for any person with an immunocompromised condition through the age of 26 years if she did not get any or all doses earlier. During the 3-dose series, the second dose should be obtained 4-8 weeks after the first dose. The third dose should be obtained 24 weeks after the first dose and 16 weeks after the second dose.  Zoster vaccine. One dose is recommended for adults aged 60 years or older unless certain conditions are  present.  Measles, mumps, and rubella (MMR) vaccine. Adults born before 1957 generally are considered immune to measles and mumps. Adults born in 1957 or later should have 1 or more doses of MMR vaccine unless there is a contraindication to the vaccine or there is laboratory evidence of immunity to   each of the three diseases. A routine second dose of MMR vaccine should be obtained at least 28 days after the first dose for students attending postsecondary schools, health care workers, or international travelers. People who received inactivated measles vaccine or an unknown type of measles vaccine during 1963-1967 should receive 2 doses of MMR vaccine. People who received inactivated mumps vaccine or an unknown type of mumps vaccine before 1979 and are at high risk for mumps infection should consider immunization with 2 doses of MMR vaccine. For females of childbearing age, rubella immunity should be determined. If there is no evidence of immunity, females who are not pregnant should be vaccinated. If there is no evidence of immunity, females who are pregnant should delay immunization until after pregnancy. Unvaccinated health care workers born before 1957 who lack laboratory evidence of measles, mumps, or rubella immunity or laboratory confirmation of disease should consider measles and mumps immunization with 2 doses of MMR vaccine or rubella immunization with 1 dose of MMR vaccine.  Pneumococcal 13-valent conjugate (PCV13) vaccine. When indicated, a person who is uncertain of her immunization history and has no record of immunization should receive the PCV13 vaccine. An adult aged 19 years or older who has certain medical conditions and has not been previously immunized should receive 1 dose of PCV13 vaccine. This PCV13 should be followed with a dose of pneumococcal polysaccharide (PPSV23) vaccine. The PPSV23 vaccine dose should be obtained at least 8 weeks after the dose of PCV13 vaccine. An adult aged 19  years or older who has certain medical conditions and previously received 1 or more doses of PPSV23 vaccine should receive 1 dose of PCV13. The PCV13 vaccine dose should be obtained 1 or more years after the last PPSV23 vaccine dose.  Pneumococcal polysaccharide (PPSV23) vaccine. When PCV13 is also indicated, PCV13 should be obtained first. All adults aged 65 years and older should be immunized. An adult younger than age 65 years who has certain medical conditions should be immunized. Any person who resides in a nursing home or long-term care facility should be immunized. An adult smoker should be immunized. People with an immunocompromised condition and certain other conditions should receive both PCV13 and PPSV23 vaccines. People with human immunodeficiency virus (HIV) infection should be immunized as soon as possible after diagnosis. Immunization during chemotherapy or radiation therapy should be avoided. Routine use of PPSV23 vaccine is not recommended for American Indians, [place] Natives, or people younger than 65 years unless there are medical conditions that require PPSV23 vaccine. When indicated, people who have unknown immunization and have no record of immunization should receive PPSV23 vaccine. One-time revaccination 5 years after the first dose of PPSV23 is recommended for people aged 19-64 years who have chronic kidney failure, nephrotic syndrome, asplenia, or immunocompromised conditions. People who received 1-2 doses of PPSV23 before age 65 years should receive another dose of PPSV23 vaccine at age 65 years or later if at least 5 years have passed since the previous dose. Doses of PPSV23 are not needed for people immunized with PPSV23 at or after age 65 years.  Meningococcal vaccine. Adults with asplenia or persistent complement component deficiencies should receive 2 doses of quadrivalent meningococcal conjugate (MenACWY-D) vaccine. The doses should be obtained at least 2 months apart.  Microbiologists working with certain meningococcal bacteria, military recruits, people at risk during an outbreak, and people who travel to or live in countries with a high rate of meningitis should be immunized. A first-year college student up through age   21 years who is living in a residence hall should receive a dose if she did not receive a dose on or after her 16th birthday. Adults who have certain high-risk conditions should receive one or more doses of vaccine.  Hepatitis A vaccine. Adults who wish to be protected from this disease, have certain high-risk conditions, work with hepatitis A-infected animals, work in hepatitis A research labs, or travel to or work in countries with a high rate of hepatitis A should be immunized. Adults who were previously unvaccinated and who anticipate close contact with an international adoptee during the first 60 days after arrival in the [place] from a country with a high rate of hepatitis A should be immunized.  Hepatitis B vaccine. Adults who wish to be protected from this disease, have certain high-risk conditions, may be exposed to blood or other infectious body fluids, are household contacts or sex partners of hepatitis B positive people, are clients or workers in certain care facilities, or travel to or work in countries with a high rate of hepatitis B should be immunized.  Haemophilus influenzae type b (Hib) vaccine. A previously unvaccinated person with asplenia or sickle cell disease or having a scheduled splenectomy should receive 1 dose of Hib vaccine. Regardless of previous immunization, a recipient of a hematopoietic stem cell transplant should receive a 3-dose series 6-12 months after her successful transplant. Hib vaccine is not recommended for adults with HIV infection. Preventive Services / Frequency Ages 64 to 68 years  Blood pressure check.** / Every 1 to 2 years.  Lipid and cholesterol check.** / Every 5 years beginning at age  22.  Clinical breast exam.** / Every 3 years for women in their 88s and 53s.  BRCA-related cancer risk assessment.** / For women who have family members with a BRCA-related cancer (breast, ovarian, tubal, or peritoneal cancers).  Pap test.** / Every 2 years from ages 90 through 51. Every 3 years starting at age 21 through age 56 or 3 with a history of 3 consecutive normal Pap tests.  HPV screening.** / Every 3 years from ages 24 through ages 1 to 46 with a history of 3 consecutive normal Pap tests.  Hepatitis C blood test.** / For any individual with known risks for hepatitis C.  Skin self-exam. / Monthly.  Influenza vaccine. / Every year.  Tetanus, diphtheria, and acellular pertussis (Tdap, Td) vaccine.** / Consult your health care provider. Pregnant women should receive 1 dose of Tdap vaccine during each pregnancy. 1 dose of Td every 10 years.  Varicella vaccine.** / Consult your health care provider. Pregnant females who do not have evidence of immunity should receive the first dose after pregnancy.  HPV vaccine. / 3 doses over 6 months, if 72 and younger. The vaccine is not recommended for use in pregnant females. However, pregnancy testing is not needed before receiving a dose.  Measles, mumps, rubella (MMR) vaccine.** / You need at least 1 dose of MMR if you were born in 1957 or later. You may also need a 2nd dose. For females of childbearing age, rubella immunity should be determined. If there is no evidence of immunity, females who are not pregnant should be vaccinated. If there is no evidence of immunity, females who are pregnant should delay immunization until after pregnancy.  Pneumococcal 13-valent conjugate (PCV13) vaccine.** / Consult your health care provider.  Pneumococcal polysaccharide (PPSV23) vaccine.** / 1 to 2 doses if you smoke cigarettes or if you have certain conditions.  Meningococcal vaccine.** /  1 dose if you are age 19 to 21 years and a first-year college  student living in a residence hall, or have one of several medical conditions, you need to get vaccinated against meningococcal disease. You may also need additional booster doses.  Hepatitis A vaccine.** / Consult your health care provider.  Hepatitis B vaccine.** / Consult your health care provider.  Haemophilus influenzae type b (Hib) vaccine.** / Consult your health care provider. Ages 40 to 64 years  Blood pressure check.** / Every 1 to 2 years.  Lipid and cholesterol check.** / Every 5 years beginning at age 20 years.  Lung cancer screening. / Every year if you are aged 55-80 years and have a 30-pack-year history of smoking and currently smoke or have quit within the past 15 years. Yearly screening is stopped once you have quit smoking for at least 15 years or develop a health problem that would prevent you from having lung cancer treatment.  Clinical breast exam.** / Every year after age 40 years.  BRCA-related cancer risk assessment.** / For women who have family members with a BRCA-related cancer (breast, ovarian, tubal, or peritoneal cancers).  Mammogram.** / Every year beginning at age 40 years and continuing for as long as you are in good health. Consult with your health care provider.  Pap test.** / Every 3 years starting at age 30 years through age 65 or 70 years with a history of 3 consecutive normal Pap tests.  HPV screening.** / Every 3 years from ages 30 years through ages 65 to 70 years with a history of 3 consecutive normal Pap tests.  Fecal occult blood test (FOBT) of stool. / Every year beginning at age 50 years and continuing until age 75 years. You may not need to do this test if you get a colonoscopy every 10 years.  Flexible sigmoidoscopy or colonoscopy.** / Every 5 years for a flexible sigmoidoscopy or every 10 years for a colonoscopy beginning at age 50 years and continuing until age 75 years.  Hepatitis C blood test.** / For all people born from 1945 through  1965 and any individual with known risks for hepatitis C.  Skin self-exam. / Monthly.  Influenza vaccine. / Every year.  Tetanus, diphtheria, and acellular pertussis (Tdap/Td) vaccine.** / Consult your health care provider. Pregnant women should receive 1 dose of Tdap vaccine during each pregnancy. 1 dose of Td every 10 years.  Varicella vaccine.** / Consult your health care provider. Pregnant females who do not have evidence of immunity should receive the first dose after pregnancy.  Zoster vaccine.** / 1 dose for adults aged 60 years or older.  Measles, mumps, rubella (MMR) vaccine.** / You need at least 1 dose of MMR if you were born in 1957 or later. You may also need a 2nd dose. For females of childbearing age, rubella immunity should be determined. If there is no evidence of immunity, females who are not pregnant should be vaccinated. If there is no evidence of immunity, females who are pregnant should delay immunization until after pregnancy.  Pneumococcal 13-valent conjugate (PCV13) vaccine.** / Consult your health care provider.  Pneumococcal polysaccharide (PPSV23) vaccine.** / 1 to 2 doses if you smoke cigarettes or if you have certain conditions.  Meningococcal vaccine.** / Consult your health care provider.  Hepatitis A vaccine.** / Consult your health care provider.  Hepatitis B vaccine.** / Consult your health care provider.  Haemophilus influenzae type b (Hib) vaccine.** / Consult your health care provider. Ages 65   years and over  Blood pressure check.** / Every 1 to 2 years.  Lipid and cholesterol check.** / Every 5 years beginning at age 22 years.  Lung cancer screening. / Every year if you are aged 73-80 years and have a 30-pack-year history of smoking and currently smoke or have quit within the past 15 years. Yearly screening is stopped once you have quit smoking for at least 15 years or develop a health problem that would prevent you from having lung cancer  treatment.  Clinical breast exam.** / Every year after age 4 years.  BRCA-related cancer risk assessment.** / For women who have family members with a BRCA-related cancer (breast, ovarian, tubal, or peritoneal cancers).  Mammogram.** / Every year beginning at age 40 years and continuing for as long as you are in good health. Consult with your health care provider.  Pap test.** / Every 3 years starting at age 9 years through age 34 or 91 years with 3 consecutive normal Pap tests. Testing can be stopped between 65 and 70 years with 3 consecutive normal Pap tests and no abnormal Pap or HPV tests in the past 10 years.  HPV screening.** / Every 3 years from ages 57 years through ages 64 or 45 years with a history of 3 consecutive normal Pap tests. Testing can be stopped between 65 and 70 years with 3 consecutive normal Pap tests and no abnormal Pap or HPV tests in the past 10 years.  Fecal occult blood test (FOBT) of stool. / Every year beginning at age 15 years and continuing until age 17 years. You may not need to do this test if you get a colonoscopy every 10 years.  Flexible sigmoidoscopy or colonoscopy.** / Every 5 years for a flexible sigmoidoscopy or every 10 years for a colonoscopy beginning at age 86 years and continuing until age 71 years.  Hepatitis C blood test.** / For all people born from [birth year] through 1967 and any individual with known risks for hepatitis C.  Osteoporosis screening.** / A one-time screening for women ages 83 years and over and women at risk for fractures or osteoporosis.  Skin self-exam. / Monthly.  Influenza vaccine. / Every year.  Tetanus, diphtheria, and acellular pertussis (Tdap/Td) vaccine.** / 1 dose of Td every 10 years.  Varicella vaccine.** / Consult your health care provider.  Zoster vaccine.** / 1 dose for adults aged 61 years or older.  Pneumococcal 13-valent conjugate (PCV13) vaccine.** / Consult your health care provider.  Pneumococcal  polysaccharide (PPSV23) vaccine.** / 1 dose for all adults aged 28 years and older.  Meningococcal vaccine.** / Consult your health care provider.  Hepatitis A vaccine.** / Consult your health care provider.  Hepatitis B vaccine.** / Consult your health care provider.  Haemophilus influenzae type b (Hib) vaccine.** / Consult your health care provider. ** Family history and personal history of risk and conditions may change your health care provider's recommendations. Document Released: 04/14/2001 Document Revised: 07/03/2013 Document Reviewed: 07/14/2010 [facility] Patient Information 2015 [place], [place]. This information is not intended to replace advice given to you by your health care provider. Make sure you discuss any questions you have with your health care provider.

## 2014-06-08 NOTE — Progress Notes (Signed)
Patient ID: Jody Bryan, female    DOB: 04/27/1980  Age: 34 y.o. MRN: [id number]    Subjective:   HPI   Review of Systems  History    Objective:   Physical Exam     Assessment & Plan:  Plan           Subjective:     Jody Bryan is a 34 y.o. female and is here for a comprehensive physical exam. The patient reports problems - itchy feet.  History   Social History  . Marital Status: Single    Spouse Name: N/A  . Number of Children: N/A  . Years of Education: N/A   Occupational History  . Not on file.   Social History Main Topics  . Smoking status: Never Smoker   . Smokeless tobacco: Not on file  . Alcohol Use: Yes     Comment: social  . Drug Use: No  . Sexual Activity: Not on file   Other Topics Concern  . Not on file   Social History Narrative   Health Maintenance  Topic Date Due  . HIV Screening  03/03/1993  . INFLUENZA VACCINE  10/01/2014  . PAP SMEAR  09/06/2015  . TETANUS/TDAP  07/31/2020    The following portions of the patient's history were reviewed and updated as appropriate:  She  has a past medical history of Chicken pox. She  does not have any pertinent problems on file. She  has no past surgical history on file. Her family history includes Alcohol abuse in her maternal grandfather and sister; Arthritis in her paternal grandmother; Cancer in her paternal grandmother; Heart disease in her maternal grandmother; Hypertension in her father and mother; Stroke in her sister. She  reports that she has never smoked. She does not have any smokeless tobacco history on file. She reports that she drinks alcohol. She reports that she does not use illicit drugs. She has a current medication list which includes the following prescription(s): medroxyprogesterone, meloxicam, and clotrimazole-betamethasone, and the following Facility-Administered Medications: methylprednisolone acetate. No current outpatient prescriptions on file prior to visit.    Current Facility-Administered Medications on File Prior to Visit  Medication Dose Route Frequency Provider Last Rate Last Dose  . methylPREDNISolone acetate (DEPO-MEDROL) injection 80 mg  80 mg Intramuscular Once Sheliah HatchKatherine E Tabori, MD   80 mg at 08/23/12 1510   She has No Known Allergies..  Review of Systems Review of Systems  Constitutional: Negative for activity change, appetite change and fatigue.  HENT: Negative for hearing loss, congestion, tinnitus and ear discharge.  dentist [id number] Eyes: Negative for visual disturbance (see optho q1y -- vision corrected to 20/20 with glasses).  Respiratory: Negative for cough, chest tightness and shortness of breath.   Cardiovascular: Negative for chest pain, palpitations and leg swelling.  Gastrointestinal: Negative for abdominal pain, diarrhea, constipation and abdominal distention.  Genitourinary: Negative for urgency, frequency, decreased urine volume and difficulty urinating.  Musculoskeletal: Negative for back pain, arthralgias and gait problem.  Skin: Negative for color change, pallor and rash.  Neurological: Negative for dizziness, light-headedness, numbness and headaches.  Hematological: Negative for adenopathy. Does not bruise/bleed easily.  Psychiatric/Behavioral: Negative for suicidal ideas, confusion, sleep disturbance, self-injury, dysphoric mood, decreased concentration and agitation.       Objective:    BP 122/80 mmHg  Pulse 77  Temp(Src) 98.5 F (36.9 C) (Oral)  Ht 5\' 6"  (1.676 m)  Wt 230 lb (104.327 kg)  BMI 37.14 kg/m2  SpO2 97% General appearance: alert,  cooperative, appears stated age and no distress Head: Normocephalic, without obvious abnormality, atraumatic Eyes: conjunctivae/corneas clear. PERRL, EOM's intact. Fundi benign. Ears: normal TM's and external ear canals both ears Nose: Nares normal. Septum midline. Mucosa normal. No drainage or sinus tenderness. Throat: lips, mucosa, and tongue normal; teeth and  gums normal Neck: no adenopathy, no carotid bruit, no JVD, supple, symmetrical, trachea midline and thyroid not enlarged, symmetric, no tenderness/mass/nodules Back: symmetric, no curvature. ROM normal. No CVA tenderness. Lungs: clear to auscultation bilaterally Breasts: normal appearance, no masses or tenderness Heart: S1, S2 normal Abdomen: soft, non-tender; bowel sounds normal; no masses,  no organomegaly Pelvic: cervix normal in appearance, external genitalia normal, no adnexal masses or tenderness, no cervical motion tenderness, rectovaginal septum normal, uterus normal size, shape, and consistency, vagina normal without discharge and pap done Extremities: extremities normal, atraumatic, no cyanosis or edema Pulses: 2+ and symmetric Skin: Skin color, texture, turgor normal. No rashes or lesions Lymph nodes: Cervical, supraclavicular, and axillary nodes normal. Neurologic: Alert and oriented X 3, normal strength and tone. Normal symmetric reflexes. Normal coordination and gait Psych-- no depression, no anxiety      Assessment:    Healthy female exam.     Plan:    ghm utd Check labs See After Visit Summary for Counseling Recommendations   1. Preventative health care  - HIV antibody (with reflex) - Lipid panel - Hepatic function panel - Basic Metabolic Panel (BMET) - TSH - meloxicam (MOBIC) 15 MG tablet; Take 1 tablet (15 mg total) by mouth daily.  Dispense: 30 tablet; Refill: 1 - CBC with Differential/Platelet - POCT urinalysis dipstick  2. Tinea pedis, unspecified laterality  - clotrimazole-betamethasone (LOTRISONE) cream; Apply 1 application topically 2 (two) times daily.  Dispense: 30 g; Refill: 0

## 2014-06-08 NOTE — Addendum Note (Signed)
Addended by: Silvio PateHOMPSON, Ordean Fouts D on: 06/08/2014 04:27 PM   Modules accepted: Orders

## 2014-06-08 NOTE — Addendum Note (Signed)
Addended by: Silvio PateHOMPSON, Zymier Rodgers D on: 06/08/2014 04:25 PM   Modules accepted: Orders

## 2014-06-08 NOTE — Progress Notes (Signed)
Pre visit review using our clinic review tool, if applicable. No additional management support is needed unless otherwise documented below in the visit note. 

## 2014-06-08 NOTE — Addendum Note (Signed)
Addended by: Baird KayUGLAS, Rosann Gorum M on: 06/08/2014 04:43 PM   Modules accepted: Orders

## 2014-06-09 LAB — HIV ANTIBODY (ROUTINE TESTING W REFLEX): HIV: NONREACTIVE

## 2014-06-11 NOTE — Progress Notes (Signed)
Quick Note:  Any signs of infection-? Wbc up----fever , chills, congestion Rest of labs are good ______

## 2014-06-12 LAB — CYTOLOGY - PAP

## 2014-07-18 ENCOUNTER — Ambulatory Visit (INDEPENDENT_AMBULATORY_CARE_PROVIDER_SITE_OTHER): Payer: Managed Care, Other (non HMO) | Admitting: *Deleted

## 2014-07-18 DIAGNOSIS — Z308 Encounter for other contraceptive management: Secondary | ICD-10-CM | POA: Diagnosis not present

## 2014-07-18 MED ORDER — MEDROXYPROGESTERONE ACETATE 150 MG/ML IM SUSP
150.0000 mg | Freq: Once | INTRAMUSCULAR | Status: AC
  Administered 2014-07-18: 150 mg via INTRAMUSCULAR

## 2014-07-18 NOTE — Progress Notes (Signed)
Pre visit review using our clinic review tool, if applicable. No additional management support is needed unless otherwise documented below in the visit note.  Patient tolerated injection well.  Next injection scheduled 10/04/14.  

## 2014-07-20 ENCOUNTER — Encounter: Payer: Managed Care, Other (non HMO) | Admitting: Family Medicine

## 2014-07-23 ENCOUNTER — Ambulatory Visit (HOSPITAL_BASED_OUTPATIENT_CLINIC_OR_DEPARTMENT_OTHER)
Admission: RE | Admit: 2014-07-23 | Discharge: 2014-07-23 | Disposition: A | Discharge status: Home / Self Care | Payer: Managed Care, Other (non HMO) | Source: Ambulatory Visit | Attending: Medical | Admitting: Medical

## 2014-07-23 ENCOUNTER — Ambulatory Visit (INDEPENDENT_AMBULATORY_CARE_PROVIDER_SITE_OTHER): Payer: Managed Care, Other (non HMO) | Admitting: Medical

## 2014-07-23 ENCOUNTER — Encounter: Payer: Self-pay | Admitting: Medical

## 2014-07-23 VITALS — BP 121/90 | HR 64 | Temp 98.5°F | Wt 228.6 lb

## 2014-07-23 DIAGNOSIS — M545 Low back pain, unspecified: Secondary | ICD-10-CM

## 2014-07-23 MED ORDER — TIZANIDINE HCL 6 MG PO CAPS: ORAL_CAPSULE | ORAL | Status: DC

## 2014-07-23 MED ORDER — HYDROCODONE-ACETAMINOPHEN 5-325 MG PO TABS: 1.0000 | ORAL_TABLET | Freq: Four times a day (QID) | ORAL | Status: DC | PRN

## 2014-07-23 MED ORDER — DICLOFENAC SODIUM 75 MG PO TBEC: 75.0000 mg | DELAYED_RELEASE_TABLET | Freq: Two times a day (BID) | ORAL | Status: DC
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
## 2014-07-23 NOTE — Patient Instructions (Addendum)
Back pain Lumbar xray. Back stretching exercises.   Rx diclofenac, zanaflex, and norco.    Don't use any more oxycodone while on the above.  Follow up in 10 days or as needed. Pain persists may refer to PT.    Advise back stretching exercises.

## 2014-07-23 NOTE — Assessment & Plan Note (Signed)
Lumbar xray. Back stretching exercises.   Rx diclofenac, zanaflex, and norco.    Don't use any more oxycodone while on the above.  Follow up in 10 days or as needed. Pain persists may refer to PT.

## 2014-07-23 NOTE — Progress Notes (Signed)
Pre visit review using our clinic review tool, if applicable. No additional management support is needed unless otherwise documented below in the visit note. 

## 2014-07-24 ENCOUNTER — Telehealth: Payer: Self-pay | Admitting: Family Medicine

## 2014-07-24 NOTE — Telephone Encounter (Signed)
Letter printed and left on Jody Bryan's desk.       KP

## 2014-07-24 NOTE — Telephone Encounter (Signed)
Pt still having pain. She wants to know if she can have some days off. Advised will have work note to return on this coming Friday.

## 2014-07-24 NOTE — Telephone Encounter (Signed)
Please advise      KP 

## 2014-07-24 NOTE — Telephone Encounter (Signed)
Caller name: Johanne Relation to pt: self Call back number: [phone] Pharmacy:  Reason for call:   Is wanting to know when she can return to work

## 2014-08-01 ENCOUNTER — Telehealth: Payer: Self-pay | Admitting: *Deleted

## 2014-08-01 NOTE — Telephone Encounter (Signed)
Received FMLA paperwork via fax from [employer]. Filled out as much as possible and [facility]. JG//CMA

## 2014-08-06 NOTE — Telephone Encounter (Signed)
[facility]. Sent for scanning. JG//CMA

## 2014-08-08 ENCOUNTER — Telehealth: Payer: Self-pay | Admitting: *Deleted

## 2014-08-08 NOTE — Telephone Encounter (Signed)
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 Forwarded to [employer]. JG//CMA

## 2014-08-15 ENCOUNTER — Encounter: Payer: Managed Care, Other (non HMO) | Admitting: Family Medicine

## 2014-08-15 NOTE — Telephone Encounter (Signed)
Completed form faxed successfully. Sent for scanning. JG//CMA

## 2014-08-20 ENCOUNTER — Ambulatory Visit: Payer: Managed Care, Other (non HMO) | Admitting: Family Medicine

## 2014-08-22 ENCOUNTER — Encounter: Payer: Self-pay | Admitting: Medical

## 2014-08-22 ENCOUNTER — Ambulatory Visit (INDEPENDENT_AMBULATORY_CARE_PROVIDER_SITE_OTHER): Payer: Managed Care, Other (non HMO) | Admitting: Medical

## 2014-08-22 VITALS — BP 144/85 | HR 114 | Temp 99.6°F | Ht 66.0 in | Wt 223.4 lb

## 2014-08-22 DIAGNOSIS — M545 Low back pain, unspecified: Secondary | ICD-10-CM

## 2014-08-22 MED ORDER — DICLOFENAC SODIUM 75 MG PO TBEC: 75.0000 mg | DELAYED_RELEASE_TABLET | Freq: Two times a day (BID) | ORAL | Status: DC

## 2014-08-22 NOTE — Patient Instructions (Addendum)
Back pain Improved but still present. Refill diclofenac.  Will refer to PT. Educate on more stretching exercise/therapy if indicated.    Follow up in 3 wks or as needed.

## 2014-08-22 NOTE — Progress Notes (Signed)
Pre visit review using our clinic review tool, if applicable. No additional management support is needed unless otherwise documented below in the visit note. 

## 2014-08-22 NOTE — Assessment & Plan Note (Signed)
Improved but still present. Refill diclofenac.  Will refer to PT. Educate on more stretching exercise/therapy if indicated.
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
## 2014-09-12 ENCOUNTER — Ambulatory Visit: Payer: Managed Care, Other (non HMO) | Admitting: Physical Therapy

## 2014-09-24 ENCOUNTER — Ambulatory Visit: Payer: 59 | Attending: Medical | Admitting: Physical Therapy

## 2014-09-24 DIAGNOSIS — M545 Low back pain, unspecified: Secondary | ICD-10-CM

## 2014-09-24 DIAGNOSIS — M2560 Stiffness of unspecified joint, not elsewhere classified: Secondary | ICD-10-CM | POA: Diagnosis present

## 2014-09-24 DIAGNOSIS — M2569 Stiffness of other specified joint, not elsewhere classified: Secondary | ICD-10-CM
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
## 2014-10-02 ENCOUNTER — Ambulatory Visit: Payer: 59 | Attending: Medical | Admitting: Physical Therapy

## 2014-10-02 DIAGNOSIS — M2569 Stiffness of other specified joint, not elsewhere classified: Secondary | ICD-10-CM

## 2014-10-02 DIAGNOSIS — M545 Low back pain, unspecified: Secondary | ICD-10-CM

## 2014-10-02 DIAGNOSIS — M2560 Stiffness of unspecified joint, not elsewhere classified: Secondary | ICD-10-CM | POA: Diagnosis present
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
## 2014-10-04 ENCOUNTER — Ambulatory Visit: Payer: 59 | Admitting: Rehabilitation

## 2014-10-04 DIAGNOSIS — M545 Low back pain, unspecified: Secondary | ICD-10-CM

## 2014-10-04 DIAGNOSIS — M2569 Stiffness of other specified joint, not elsewhere classified: Secondary | ICD-10-CM

## 2014-10-04 DIAGNOSIS — M2560 Stiffness of unspecified joint, not elsewhere classified: Secondary | ICD-10-CM
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
## 2014-10-15 ENCOUNTER — Ambulatory Visit: Payer: 59 | Admitting: Rehabilitation

## 2014-10-18 ENCOUNTER — Telehealth: Payer: Self-pay | Admitting: Family Medicine

## 2014-10-18 NOTE — Telephone Encounter (Signed)
Spoke with the patient and she stated that she is taking something and she is not sure what it is, she told me she spoke with someone from the office but could not remember her name. I made the patient aware there is no documentation in her chart and I am mot sure who advised her but I do not want her to take any medication and I can not advise her unless I know what she is taking. She said all that she knows is that she is taking a pill with pink spots. I advised I would need her to have her pill bottles to discuss. She verbalized understanding and said she would call me tomorrow.       KP

## 2014-10-18 NOTE — Telephone Encounter (Signed)
Caller name:Jody Bryan Relationship to patient: Self  Can be reached: [phone] Pharmacy:  Reason for call: pt is calling because she has a question about her medications.

## 2014-10-22 ENCOUNTER — Ambulatory Visit: Payer: 59 | Admitting: Physical Therapy

## 2014-10-22 DIAGNOSIS — M545 Low back pain, unspecified: Secondary | ICD-10-CM

## 2014-10-22 DIAGNOSIS — M2560 Stiffness of unspecified joint, not elsewhere classified: Secondary | ICD-10-CM

## 2014-10-22 DIAGNOSIS — M2569 Stiffness of other specified joint, not elsewhere classified: Secondary | ICD-10-CM
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
## 2014-10-30 ENCOUNTER — Ambulatory Visit: Payer: 59 | Admitting: Physical Therapy

## 2014-10-30 DIAGNOSIS — M545 Low back pain, unspecified: Secondary | ICD-10-CM

## 2014-10-30 DIAGNOSIS — M2560 Stiffness of unspecified joint, not elsewhere classified: Secondary | ICD-10-CM

## 2014-10-30 DIAGNOSIS — M2569 Stiffness of other specified joint, not elsewhere classified: Secondary | ICD-10-CM
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
## 2014-11-07 ENCOUNTER — Ambulatory Visit: Payer: 59 | Admitting: Rehabilitation

## 2014-11-07 DIAGNOSIS — M545 Low back pain: Secondary | ICD-10-CM | POA: Insufficient documentation

## 2014-11-07 DIAGNOSIS — M2560 Stiffness of unspecified joint, not elsewhere classified: Secondary | ICD-10-CM | POA: Insufficient documentation
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
## 2014-11-12 ENCOUNTER — Ambulatory Visit: Payer: 59 | Attending: Medical | Admitting: Physical Therapy

## 2014-11-21 ENCOUNTER — Ambulatory Visit: Payer: 59 | Admitting: Rehabilitation

## 2014-11-29 ENCOUNTER — Ambulatory Visit: Payer: 59 | Admitting: Physical Therapy

## 2014-12-24 ENCOUNTER — Telehealth: Payer: Self-pay | Admitting: Family Medicine

## 2014-12-24 NOTE — Telephone Encounter (Signed)
Please add pregnancy test to the notes for her visit. (preg test and depo)    KP

## 2014-12-24 NOTE — Telephone Encounter (Signed)
Pt called in to schedule depo shot stating it is past due. Scheduled for 12/27/14.

## 2014-12-24 NOTE — Telephone Encounter (Signed)
No, she just needs a pregnancy test before she gets the injection, they should know the protocol, but I wanted the note in there just in case. She has had her CPE this year.

## 2014-12-24 NOTE — Telephone Encounter (Signed)
I scheduled with nurse. Does she need appt with Dr. Laury AxonLowne instead?

## 2014-12-27 ENCOUNTER — Ambulatory Visit (INDEPENDENT_AMBULATORY_CARE_PROVIDER_SITE_OTHER): Payer: Managed Care, Other (non HMO) | Admitting: Behavioral Health

## 2014-12-27 DIAGNOSIS — Z308 Encounter for other contraceptive management: Secondary | ICD-10-CM

## 2014-12-27 LAB — POCT URINE PREGNANCY: Preg Test, Ur: NEGATIVE

## 2014-12-27 MED ORDER — MEDROXYPROGESTERONE ACETATE 150 MG/ML IM SUSP
150.0000 mg | Freq: Once | INTRAMUSCULAR | Status: AC
  Administered 2014-12-27: 150 mg via INTRAMUSCULAR

## 2014-12-27 NOTE — Progress Notes (Signed)
Pre visit review using our clinic review tool, if applicable. No additional management support is needed unless otherwise documented below in the visit note.  Patient tolerated injection well.  Next injection scheduled for 03/19/15.

## 2015-03-19 ENCOUNTER — Ambulatory Visit (INDEPENDENT_AMBULATORY_CARE_PROVIDER_SITE_OTHER): Payer: Managed Care, Other (non HMO) | Admitting: Behavioral Health

## 2015-03-19 DIAGNOSIS — Z308 Encounter for other contraceptive management: Secondary | ICD-10-CM | POA: Diagnosis not present

## 2015-03-19 MED ORDER — MEDROXYPROGESTERONE ACETATE 150 MG/ML IM SUSP
150.0000 mg | Freq: Once | INTRAMUSCULAR | Status: AC
  Administered 2015-03-19: 150 mg via INTRAMUSCULAR

## 2015-03-19 NOTE — Progress Notes (Signed)
Pre visit review using our clinic review tool, if applicable. No additional management support is needed unless otherwise documented below in the visit note.  Patient tolerated injection well.  Next injection scheduled for 06/11/15.

## 2015-04-30 ENCOUNTER — Telehealth: Payer: Self-pay | Admitting: Family Medicine

## 2015-04-30 NOTE — Telephone Encounter (Signed)
LM for pt to call and schedule flu shot or update records. °

## 2015-04-30 NOTE — Telephone Encounter (Signed)
Declined Flu Shot °

## 2015-04-30 NOTE — Telephone Encounter (Signed)
Updated.      KP 

## 2015-06-10 ENCOUNTER — Telehealth: Payer: Self-pay

## 2015-06-10 NOTE — Telephone Encounter (Signed)
Pre Visit Call made to patient. Will call back on her break.

## 2015-06-10 NOTE — Telephone Encounter (Signed)
Patient returned call regarding .Pre-Visit information.

## 2015-06-10 NOTE — Telephone Encounter (Signed)
Pre Visit call completed with patient.  

## 2015-06-11 ENCOUNTER — Encounter: Payer: Self-pay | Admitting: Family Medicine

## 2015-06-11 ENCOUNTER — Ambulatory Visit (INDEPENDENT_AMBULATORY_CARE_PROVIDER_SITE_OTHER): Payer: Managed Care, Other (non HMO) | Admitting: Family Medicine

## 2015-06-11 ENCOUNTER — Ambulatory Visit: Payer: Managed Care, Other (non HMO)

## 2015-06-11 VITALS — BP 98/60 | HR 90 | Temp 98.9°F | Ht 65.5 in | Wt 207.8 lb

## 2015-06-11 DIAGNOSIS — Z833 Family history of diabetes mellitus: Secondary | ICD-10-CM

## 2015-06-11 DIAGNOSIS — Z0000 Encounter for general adult medical examination without abnormal findings: Secondary | ICD-10-CM

## 2015-06-11 DIAGNOSIS — B353 Tinea pedis: Secondary | ICD-10-CM

## 2015-06-11 DIAGNOSIS — Z30013 Encounter for initial prescription of injectable contraceptive: Secondary | ICD-10-CM

## 2015-06-11 LAB — CBC WITH DIFFERENTIAL/PLATELET
BASOS PCT: 0.6 % (ref 0.0–3.0)
Basophils Absolute: 0 10*3/uL (ref 0.0–0.1)
Eosinophils Absolute: 0 10*3/uL (ref 0.0–0.7)
Eosinophils Relative: 0.2 % (ref 0.0–5.0)
HEMATOCRIT: 42.6 % (ref 36.0–46.0)
HEMOGLOBIN: 14.2 g/dL (ref 12.0–15.0)
LYMPHS PCT: 33.8 % (ref 12.0–46.0)
Lymphs Abs: 2.3 10*3/uL (ref 0.7–4.0)
MCHC: 33.5 g/dL (ref 30.0–36.0)
MCV: 89.7 fl (ref 78.0–100.0)
MONOS PCT: 9.2 % (ref 3.0–12.0)
Monocytes Absolute: 0.6 10*3/uL (ref 0.1–1.0)
Neutro Abs: 3.9 10*3/uL (ref 1.4–7.7)
Neutrophils Relative %: 56.2 % (ref 43.0–77.0)
Platelets: 289 10*3/uL (ref 150.0–400.0)
RBC: 4.74 Mil/uL (ref 3.87–5.11)
RDW: 13.5 % (ref 11.5–15.5)
WBC: 6.9 10*3/uL (ref 4.0–10.5)

## 2015-06-11 LAB — LIPID PANEL
CHOLESTEROL: 140 mg/dL (ref 0–200)
HDL: 39 mg/dL — ABNORMAL LOW (ref >39.00)
LDL Cholesterol: 90 mg/dL (ref 0–99)
NonHDL: 100.77
Total CHOL/HDL Ratio: 4
Triglycerides: 52 mg/dL (ref 0.0–149.0)
VLDL: 10.4 mg/dL (ref 0.0–40.0)

## 2015-06-11 LAB — HEMOGLOBIN A1C: HEMOGLOBIN A1C: 5.7 % (ref 4.6–6.5)

## 2015-06-11 LAB — HEPATIC FUNCTION PANEL
ALT: 32 U/L (ref 0–35)
AST: 20 U/L (ref 0–37)
Albumin: 4.4 g/dL (ref 3.5–5.2)
Alkaline Phosphatase: 64 U/L (ref 39–117)
Bilirubin, Direct: 0.2 mg/dL (ref 0.0–0.3)
TOTAL PROTEIN: 7.6 g/dL (ref 6.0–8.3)
Total Bilirubin: 1 mg/dL (ref 0.2–1.2)

## 2015-06-11 LAB — BASIC METABOLIC PANEL
BUN: 9 mg/dL (ref 6–23)
CHLORIDE: 107 meq/L (ref 96–112)
CO2: 25 meq/L (ref 19–32)
Calcium: 10.1 mg/dL (ref 8.4–10.5)
Creatinine, Ser: 0.97 mg/dL (ref 0.40–1.20)
GFR: 82.98 mL/min (ref >60.00)
GLUCOSE: 84 mg/dL (ref 70–99)
POTASSIUM: 4.6 meq/L (ref 3.5–5.1)
SODIUM: 140 meq/L (ref 135–145)

## 2015-06-11 LAB — TSH: TSH: 1.28 u[IU]/mL (ref 0.35–4.50)

## 2015-06-11 MED ORDER — MEDROXYPROGESTERONE ACETATE 150 MG/ML IM SUSP
150.0000 mg | Freq: Once | INTRAMUSCULAR | Status: AC
  Administered 2015-06-11: 150 mg via INTRAMUSCULAR

## 2015-06-11 MED ORDER — CLOTRIMAZOLE-BETAMETHASONE 1-0.05 % EX CREA: 1.0000 "application " | TOPICAL_CREAM | Freq: Two times a day (BID) | CUTANEOUS | Status: DC

## 2015-06-11 NOTE — Progress Notes (Signed)
Subjective:    Patient ID: Jody Bryan, female    DOB: 07-30-1980, 35 y.o.   MRN: [id number]  Chief Complaint  Patient presents with  . Annual Exam    fasting    HPI Patient is in today for Annual Physical Exam.    Past Medical History  Diagnosis Date  . Chicken pox     No past surgical history on file.  Family History  Problem Relation Age of Onset  . Hypertension Mother   . Hypertension Father   . Alcohol abuse Sister   . Stroke Sister   . Heart disease Maternal Grandmother   . Alcohol abuse Maternal Grandfather   . Arthritis Paternal Grandmother   . Cancer Paternal Grandmother     breast    Social History   Social History  . Marital Status: Single    Spouse Name: N/A  . Number of Children: N/A  . Years of Education: N/A   Occupational History  . Not on file.   Social History Main Topics  . Smoking status: Current Every Day Smoker  . Smokeless tobacco: Never Used  . Alcohol Use: 0.0 oz/week    0 Standard drinks or equivalent per week     Comment: social  . Drug Use: No  . Sexual Activity: Not on file   Other Topics Concern  . Not on file   Social History Narrative    Outpatient Prescriptions Prior to Visit  Medication Sig Dispense Refill  . medroxyPROGESTERone (DEPO-PROVERA) 150 MG/ML injection Inject 150 mg into the muscle every 3 (three) months.    . diclofenac (VOLTAREN) 75 MG EC tablet Take 1 tablet (75 mg total) by mouth 2 (two) times daily. (Patient not taking: Reported on 06/11/2015) 30 tablet 0   No facility-administered medications prior to visit.    No Known Allergies  Review of Systems  Constitutional: Negative for fever and malaise/fatigue.  HENT: Negative for congestion.   Eyes: Negative for blurred vision.  Respiratory: Negative for shortness of breath.   Cardiovascular: Negative for chest pain, palpitations and leg swelling.  Gastrointestinal: Negative for nausea, abdominal pain and blood in stool.  Genitourinary:  Negative for dysuria and frequency.  Musculoskeletal: Negative for falls.  Skin: Negative for rash.  Neurological: Negative for dizziness, loss of consciousness and headaches.  Endo/Heme/Allergies: Negative for environmental allergies.  Psychiatric/Behavioral: Negative for depression. The patient is not nervous/anxious.        Objective:    Physical Exam  Constitutional: She is oriented to person, place, and time. She appears well-developed and well-nourished. No distress.  HENT:  Head: Normocephalic and atraumatic.  Eyes: Conjunctivae are normal.  Neck: Neck supple. No thyromegaly present.  Cardiovascular: Normal rate, regular rhythm and normal heart sounds.   No murmur heard. Pulmonary/Chest: Effort normal and breath sounds normal. No respiratory distress.  Abdominal: Soft. Bowel sounds are normal. She exhibits no distension and no mass. There is no tenderness.  Musculoskeletal: She exhibits no edema.  Lymphadenopathy:    She has no cervical adenopathy.  Neurological: She is alert and oriented to person, place, and time.  Skin: Skin is warm and dry.  Psychiatric: She has a normal mood and affect. Her behavior is normal.    BP 98/60 mmHg  Pulse 90  Temp(Src) 98.9 F (37.2 C) (Oral)  Ht 5' 5.5" (1.664 m)  Wt 207 lb 12.8 oz (94.257 kg)  BMI 34.04 kg/m2  SpO2 98%  LMP 06/09/2015 Wt Readings from Last 3 Encounters:  06/11/15  207 lb 12.8 oz (94.257 kg)  08/22/14 223 lb 6.4 oz (101.334 kg)  07/23/14 228 lb 9.6 oz (103.692 kg)     Lab Results  Component Value Date   WBC 6.9 06/11/2015   HGB 14.2 06/11/2015   HCT 42.6 06/11/2015   PLT 289.0 06/11/2015   GLUCOSE 84 06/11/2015   CHOL 140 06/11/2015   TRIG 52.0 06/11/2015   HDL 39.00* 06/11/2015   LDLCALC 90 06/11/2015   ALT 32 06/11/2015   AST 20 06/11/2015   NA 140 06/11/2015   K 4.6 06/11/2015   CL 107 06/11/2015   CREATININE 0.97 06/11/2015   BUN 9 06/11/2015   CO2 25 06/11/2015   TSH 1.28 06/11/2015    HGBA1C 5.7 06/11/2015    Lab Results  Component Value Date   TSH 1.28 06/11/2015   Lab Results  Component Value Date   WBC 6.9 06/11/2015   HGB 14.2 06/11/2015   HCT 42.6 06/11/2015   MCV 89.7 06/11/2015   PLT 289.0 06/11/2015   Lab Results  Component Value Date   NA 140 06/11/2015   K 4.6 06/11/2015   CO2 25 06/11/2015   GLUCOSE 84 06/11/2015   BUN 9 06/11/2015   CREATININE 0.97 06/11/2015   BILITOT 1.0 06/11/2015   ALKPHOS 64 06/11/2015   AST 20 06/11/2015   ALT 32 06/11/2015   PROT 7.6 06/11/2015   ALBUMIN 4.4 06/11/2015   CALCIUM 10.1 06/11/2015   GFR 82.98 06/11/2015   Lab Results  Component Value Date   CHOL 140 06/11/2015   Lab Results  Component Value Date   HDL 39.00* 06/11/2015   Lab Results  Component Value Date   LDLCALC 90 06/11/2015   Lab Results  Component Value Date   TRIG 52.0 06/11/2015   Lab Results  Component Value Date   CHOLHDL 4 06/11/2015   Lab Results  Component Value Date   HGBA1C 5.7 06/11/2015       Assessment & Plan:   Problem List Items Addressed This Visit      Unprioritized   Birth control   Relevant Medications   medroxyPROGESTERone (DEPO-PROVERA) injection 150 mg (Completed)    Other Visit Diagnoses    Preventative health care    -  Primary    Relevant Orders    Lipid panel (Completed)    Basic metabolic panel (Completed)    TSH (Completed)    Hemoglobin A1c (Completed)    CBC w/Diff (Completed)    Hepatic function panel (Completed)    Family history of diabetes mellitus (DM)        Relevant Orders    Lipid panel (Completed)    Basic metabolic panel (Completed)    TSH (Completed)    Hemoglobin A1c (Completed)    CBC w/Diff (Completed)    Hepatic function panel (Completed)    Tinea pedis of both feet        Relevant Medications    clotrimazole-betamethasone (LOTRISONE) cream       I am having Ms. Zeitlin start on clotrimazole-betamethasone. I am also having her maintain her medroxyPROGESTERone  and diclofenac. We administered medroxyPROGESTERone.  Meds ordered this encounter  Medications  . medroxyPROGESTERone (DEPO-PROVERA) injection 150 mg    Sig:   . clotrimazole-betamethasone (LOTRISONE) cream    Sig: Apply 1 application topically 2 (two) times daily.    Dispense:  30 g    Refill:  0     Donato SchultzYvonne R Lowne Chase, DO

## 2015-06-11 NOTE — Patient Instructions (Signed)
Preventive Care for Adults, Female A healthy lifestyle and preventive care can promote health and wellness. Preventive health guidelines for women include the following key practices.  A routine yearly physical is a good way to check with your health care provider about your health and preventive screening. It is a chance to share any concerns and updates on your health and to receive a thorough exam.  Visit your dentist for a routine exam and preventive care every 6 months. Brush your teeth twice a day and floss once a day. Good oral hygiene prevents tooth decay and gum disease.  The frequency of eye exams is based on your age, health, family medical history, use of contact lenses, and other factors. Follow your health care provider's recommendations for frequency of eye exams.  Eat a healthy diet. Foods like vegetables, fruits, whole grains, low-fat dairy products, and lean protein foods contain the nutrients you need without too many calories. Decrease your intake of foods high in solid fats, added sugars, and salt. Eat the right amount of calories for you.Get information about a proper diet from your health care provider, if necessary.  Regular physical exercise is one of the most important things you can do for your health. Most adults should get at least 150 minutes of moderate-intensity exercise (any activity that increases your heart rate and causes you to sweat) each week. In addition, most adults need muscle-strengthening exercises on 2 or more days a week.  Maintain a healthy weight. The body mass index (BMI) is a screening tool to identify possible weight problems. It provides an estimate of body fat based on height and weight. Your health care provider can find your BMI and can help you achieve or maintain a healthy weight.For adults 20 years and older:  A BMI below 18.5 is considered underweight.  A BMI of 18.5 to 24.9 is normal.  A BMI of 25 to 29.9 is considered overweight.  A  BMI of 30 and above is considered obese.  Maintain normal blood lipids and cholesterol levels by exercising and minimizing your intake of saturated fat. Eat a balanced diet with plenty of fruit and vegetables. Blood tests for lipids and cholesterol should begin at age 45 and be repeated every 5 years. If your lipid or cholesterol levels are high, you are over 50, or you are at high risk for heart disease, you may need your cholesterol levels checked more frequently.Ongoing high lipid and cholesterol levels should be treated with medicines if diet and exercise are not working.  If you smoke, find out from your health care provider how to quit. If you do not use tobacco, do not start.  Lung cancer screening is recommended for adults aged 45-80 years who are at high risk for developing lung cancer because of a history of smoking. A yearly low-dose CT scan of the lungs is recommended for people who have at least a 30-pack-year history of smoking and are a current smoker or have quit within the past 15 years. A pack year of smoking is smoking an average of 1 pack of cigarettes a day for 1 year (for example: 1 pack a day for 30 years or 2 packs a day for 15 years). Yearly screening should continue until the smoker has stopped smoking for at least 15 years. Yearly screening should be stopped for people who develop a health problem that would prevent them from having lung cancer treatment.  If you are pregnant, do not drink alcohol. If you are  breastfeeding, be very cautious about drinking alcohol. If you are not pregnant and choose to drink alcohol, do not have more than 1 drink per day. One drink is considered to be 12 ounces (355 mL) of beer, 5 ounces (148 mL) of wine, or 1.5 ounces (44 mL) of liquor.  Avoid use of street drugs. Do not share needles with anyone. Ask for help if you need support or instructions about stopping the use of drugs.  High blood pressure causes heart disease and increases the risk  of stroke. Your blood pressure should be checked at least every 1 to 2 years. Ongoing high blood pressure should be treated with medicines if weight loss and exercise do not work.  If you are 55-79 years old, ask your health care provider if you should take aspirin to prevent strokes.  Diabetes screening is done by taking a blood sample to check your blood glucose level after you have not eaten for a certain period of time (fasting). If you are not overweight and you do not have risk factors for diabetes, you should be screened once every 3 years starting at age 45. If you are overweight or obese and you are 40-70 years of age, you should be screened for diabetes every year as part of your cardiovascular risk assessment.  Breast cancer screening is essential preventive care for women. You should practice "breast self-awareness." This means understanding the normal appearance and feel of your breasts and may include breast self-examination. Any changes detected, no matter how small, should be reported to a health care provider. Women in their 20s and 30s should have a clinical breast exam (CBE) by a health care provider as part of a regular health exam every 1 to 3 years. After age 40, women should have a CBE every year. Starting at age 40, women should consider having a mammogram (breast X-ray test) every year. Women who have a family history of breast cancer should talk to their health care provider about genetic screening. Women at a high risk of breast cancer should talk to their health care providers about having an MRI and a mammogram every year.  Breast cancer gene (BRCA)-related cancer risk assessment is recommended for women who have family members with BRCA-related cancers. BRCA-related cancers include breast, ovarian, tubal, and peritoneal cancers. Having family members with these cancers may be associated with an increased risk for harmful changes (mutations) in the breast cancer genes BRCA1 and  BRCA2. Results of the assessment will determine the need for genetic counseling and BRCA1 and BRCA2 testing.  Your health care provider may recommend that you be screened regularly for cancer of the pelvic organs (ovaries, uterus, and vagina). This screening involves a pelvic examination, including checking for microscopic changes to the surface of your cervix (Pap test). You may be encouraged to have this screening done every 3 years, beginning at age 21.  For women ages 30-65, health care providers may recommend pelvic exams and Pap testing every 3 years, or they may recommend the Pap and pelvic exam, combined with testing for human papilloma virus (HPV), every 5 years. Some types of HPV increase your risk of cervical cancer. Testing for HPV may also be done on women of any age with unclear Pap test results.  Other health care providers may not recommend any screening for nonpregnant women who are considered low risk for pelvic cancer and who do not have symptoms. Ask your health care provider if a screening pelvic exam is right for   you.  If you have had past treatment for cervical cancer or a condition that could lead to cancer, you need Pap tests and screening for cancer for at least 20 years after your treatment. If Pap tests have been discontinued, your risk factors (such as having a new sexual partner) need to be reassessed to determine if screening should resume. Some women have medical problems that increase the chance of getting cervical cancer. In these cases, your health care provider may recommend more frequent screening and Pap tests.  Colorectal cancer can be detected and often prevented. Most routine colorectal cancer screening begins at the age of 50 years and continues through age 75 years. However, your health care provider may recommend screening at an earlier age if you have risk factors for colon cancer. On a yearly basis, your health care provider may provide home test kits to check  for hidden blood in the stool. Use of a small camera at the end of a tube, to directly examine the colon (sigmoidoscopy or colonoscopy), can detect the earliest forms of colorectal cancer. Talk to your health care provider about this at age 50, when routine screening begins. Direct exam of the colon should be repeated every 5-10 years through age 75 years, unless early forms of precancerous polyps or small growths are found.  People who are at an increased risk for hepatitis B should be screened for this virus. You are considered at high risk for hepatitis B if:  You were born in a country where hepatitis B occurs often. Talk with your health care provider about which countries are considered high risk.  Your parents were born in a high-risk country and you have not received a shot to protect against hepatitis B (hepatitis B vaccine).  You have HIV or AIDS.  You use needles to inject street drugs.  You live with, or have sex with, someone who has hepatitis B.  You get hemodialysis treatment.  You take certain medicines for conditions like cancer, organ transplantation, and autoimmune conditions.  Hepatitis C blood testing is recommended for all people born from 1945 through 1965 and any individual with known risks for hepatitis C.  Practice safe sex. Use condoms and avoid high-risk sexual practices to reduce the spread of sexually transmitted infections (STIs). STIs include gonorrhea, chlamydia, syphilis, trichomonas, herpes, HPV, and human immunodeficiency virus (HIV). Herpes, HIV, and HPV are viral illnesses that have no cure. They can result in disability, cancer, and death.  You should be screened for sexually transmitted illnesses (STIs) including gonorrhea and chlamydia if:  You are sexually active and are younger than 24 years.  You are older than 24 years and your health care provider tells you that you are at risk for this type of infection.  Your sexual activity has changed  since you were last screened and you are at an increased risk for chlamydia or gonorrhea. Ask your health care provider if you are at risk.  If you are at risk of being infected with HIV, it is recommended that you take a prescription medicine daily to prevent HIV infection. This is called preexposure prophylaxis (PrEP). You are considered at risk if:  You are sexually active and do not regularly use condoms or know the HIV status of your partner(s).  You take drugs by injection.  You are sexually active with a partner who has HIV.  Talk with your health care provider about whether you are at high risk of being infected with HIV. If   you choose to begin PrEP, you should first be tested for HIV. You should then be tested every 3 months for as long as you are taking PrEP.  Osteoporosis is a disease in which the bones lose minerals and strength with aging. This can result in serious bone fractures or breaks. The risk of osteoporosis can be identified using a bone density scan. Women ages 67 years and over and women at risk for fractures or osteoporosis should discuss screening with their health care providers. Ask your health care provider whether you should take a calcium supplement or vitamin D to reduce the rate of osteoporosis.  Menopause can be associated with physical symptoms and risks. Hormone replacement therapy is available to decrease symptoms and risks. You should talk to your health care provider about whether hormone replacement therapy is right for you.  Use sunscreen. Apply sunscreen liberally and repeatedly throughout the day. You should seek shade when your shadow is shorter than you. Protect yourself by wearing long sleeves, pants, a wide-brimmed hat, and sunglasses year round, whenever you are outdoors.  Once a month, do a whole body skin exam, using a mirror to look at the skin on your back. Tell your health care provider of new moles, moles that have irregular borders, moles that  are larger than a pencil eraser, or moles that have changed in shape or color.  Stay current with required vaccines (immunizations).  Influenza vaccine. All adults should be immunized every year.  Tetanus, diphtheria, and acellular pertussis (Td, Tdap) vaccine. Pregnant women should receive 1 dose of Tdap vaccine during each pregnancy. The dose should be obtained regardless of the length of time since the last dose. Immunization is preferred during the 27th-36th week of gestation. An adult who has not previously received Tdap or who does not know her vaccine status should receive 1 dose of Tdap. This initial dose should be followed by tetanus and diphtheria toxoids (Td) booster doses every 10 years. Adults with an unknown or incomplete history of completing a 3-dose immunization series with Td-containing vaccines should begin or complete a primary immunization series including a Tdap dose. Adults should receive a Td booster every 10 years.  Varicella vaccine. An adult without evidence of immunity to varicella should receive 2 doses or a second dose if she has previously received 1 dose. Pregnant females who do not have evidence of immunity should receive the first dose after pregnancy. This first dose should be obtained before leaving the health care facility. The second dose should be obtained 4-8 weeks after the first dose.  Human papillomavirus (HPV) vaccine. Females aged 13-26 years who have not received the vaccine previously should obtain the 3-dose series. The vaccine is not recommended for use in pregnant females. However, pregnancy testing is not needed before receiving a dose. If a female is found to be pregnant after receiving a dose, no treatment is needed. In that case, the remaining doses should be delayed until after the pregnancy. Immunization is recommended for any person with an immunocompromised condition through the age of 61 years if she did not get any or all doses earlier. During the  3-dose series, the second dose should be obtained 4-8 weeks after the first dose. The third dose should be obtained 24 weeks after the first dose and 16 weeks after the second dose.  Zoster vaccine. One dose is recommended for adults aged 30 years or older unless certain conditions are present.  Measles, mumps, and rubella (MMR) vaccine. Adults born  before 1957 generally are considered immune to measles and mumps. Adults born in 1957 or later should have 1 or more doses of MMR vaccine unless there is a contraindication to the vaccine or there is laboratory evidence of immunity to each of the three diseases. A routine second dose of MMR vaccine should be obtained at least 28 days after the first dose for students attending postsecondary schools, health care workers, or international travelers. People who received inactivated measles vaccine or an unknown type of measles vaccine during 1963-1967 should receive 2 doses of MMR vaccine. People who received inactivated mumps vaccine or an unknown type of mumps vaccine before 1979 and are at high risk for mumps infection should consider immunization with 2 doses of MMR vaccine. For females of childbearing age, rubella immunity should be determined. If there is no evidence of immunity, females who are not pregnant should be vaccinated. If there is no evidence of immunity, females who are pregnant should delay immunization until after pregnancy. Unvaccinated health care workers born before 1957 who lack laboratory evidence of measles, mumps, or rubella immunity or laboratory confirmation of disease should consider measles and mumps immunization with 2 doses of MMR vaccine or rubella immunization with 1 dose of MMR vaccine.  Pneumococcal 13-valent conjugate (PCV13) vaccine. When indicated, a person who is uncertain of his immunization history and has no record of immunization should receive the PCV13 vaccine. All adults 65 years of age and older should receive this  vaccine. An adult aged 19 years or older who has certain medical conditions and has not been previously immunized should receive 1 dose of PCV13 vaccine. This PCV13 should be followed with a dose of pneumococcal polysaccharide (PPSV23) vaccine. Adults who are at high risk for pneumococcal disease should obtain the PPSV23 vaccine at least 8 weeks after the dose of PCV13 vaccine. Adults older than 37 years of age who have normal immune system function should obtain the PPSV23 vaccine dose at least 1 year after the dose of PCV13 vaccine.  Pneumococcal polysaccharide (PPSV23) vaccine. When PCV13 is also indicated, PCV13 should be obtained first. All adults aged 65 years and older should be immunized. An adult younger than age 65 years who has certain medical conditions should be immunized. Any person who resides in a nursing home or long-term care facility should be immunized. An adult smoker should be immunized. People with an immunocompromised condition and certain other conditions should receive both PCV13 and PPSV23 vaccines. People with human immunodeficiency virus (HIV) infection should be immunized as soon as possible after diagnosis. Immunization during chemotherapy or radiation therapy should be avoided. Routine use of PPSV23 vaccine is not recommended for American Indians, [place] Natives, or people younger than 65 years unless there are medical conditions that require PPSV23 vaccine. When indicated, people who have unknown immunization and have no record of immunization should receive PPSV23 vaccine. One-time revaccination 5 years after the first dose of PPSV23 is recommended for people aged 19-64 years who have chronic kidney failure, nephrotic syndrome, asplenia, or immunocompromised conditions. People who received 1-2 doses of PPSV23 before age 65 years should receive another dose of PPSV23 vaccine at age 65 years or later if at least 5 years have passed since the previous dose. Doses of PPSV23 are not  needed for people immunized with PPSV23 at or after age 65 years.  Meningococcal vaccine. Adults with asplenia or persistent complement component deficiencies should receive 2 doses of quadrivalent meningococcal conjugate (MenACWY-D) vaccine. The doses should be obtained   at least 2 months apart. Microbiologists working with certain meningococcal bacteria, [place] recruits, people at risk during an outbreak, and people who travel to or live in countries with a high rate of meningitis should be immunized. A first-year college student up through age 34 years who is living in a residence hall should receive a dose if she did not receive a dose on or after her 16th birthday. Adults who have certain high-risk conditions should receive one or more doses of vaccine.  Hepatitis A vaccine. Adults who wish to be protected from this disease, have certain high-risk conditions, work with hepatitis A-infected animals, work in hepatitis A research labs, or travel to or work in countries with a high rate of hepatitis A should be immunized. Adults who were previously unvaccinated and who anticipate close contact with an international adoptee during the first 60 days after arrival in the [place] from a country with a high rate of hepatitis A should be immunized.  Hepatitis B vaccine. Adults who wish to be protected from this disease, have certain high-risk conditions, may be exposed to blood or other infectious body fluids, are household contacts or sex partners of hepatitis B positive people, are clients or workers in certain care facilities, or travel to or work in countries with a high rate of hepatitis B should be immunized.  Haemophilus influenzae type b (Hib) vaccine. A previously unvaccinated person with asplenia or sickle cell disease or having a scheduled splenectomy should receive 1 dose of Hib vaccine. Regardless of previous immunization, a recipient of a hematopoietic stem cell transplant should receive a  3-dose series 6-12 months after her successful transplant. Hib vaccine is not recommended for adults with HIV infection. Preventive Services / Frequency Ages 35 to 4 years  Blood pressure check.** / Every 3-5 years.  Lipid and cholesterol check.** / Every 5 years beginning at age 60.  Clinical breast exam.** / Every 3 years for women in their 71s and 10s.  BRCA-related cancer risk assessment.** / For women who have family members with a BRCA-related cancer (breast, ovarian, tubal, or peritoneal cancers).  Pap test.** / Every 2 years from ages 76 through 26. Every 3 years starting at age 61 through age 76 or 93 with a history of 3 consecutive normal Pap tests.  HPV screening.** / Every 3 years from ages 37 through ages 60 to 51 with a history of 3 consecutive normal Pap tests.  Hepatitis C blood test.** / For any individual with known risks for hepatitis C.  Skin self-exam. / Monthly.  Influenza vaccine. / Every year.  Tetanus, diphtheria, and acellular pertussis (Tdap, Td) vaccine.** / Consult your health care provider. Pregnant women should receive 1 dose of Tdap vaccine during each pregnancy. 1 dose of Td every 10 years.  Varicella vaccine.** / Consult your health care provider. Pregnant females who do not have evidence of immunity should receive the first dose after pregnancy.  HPV vaccine. / 3 doses over 6 months, if 93 and younger. The vaccine is not recommended for use in pregnant females. However, pregnancy testing is not needed before receiving a dose.  Measles, mumps, rubella (MMR) vaccine.** / You need at least 1 dose of MMR if you were born in 1957 or later. You may also need a 2nd dose. For females of childbearing age, rubella immunity should be determined. If there is no evidence of immunity, females who are not pregnant should be vaccinated. If there is no evidence of immunity, females who are  pregnant should delay immunization until after pregnancy.  Pneumococcal  13-valent conjugate (PCV13) vaccine.** / Consult your health care provider.  Pneumococcal polysaccharide (PPSV23) vaccine.** / 1 to 2 doses if you smoke cigarettes or if you have certain conditions.  Meningococcal vaccine.** / 1 dose if you are age 68 to 8 years and a Market researcher living in a residence hall, or have one of several medical conditions, you need to get vaccinated against meningococcal disease. You may also need additional booster doses.  Hepatitis A vaccine.** / Consult your health care provider.  Hepatitis B vaccine.** / Consult your health care provider.  Haemophilus influenzae type b (Hib) vaccine.** / Consult your health care provider. Ages 7 to 53 years  Blood pressure check.** / Every year.  Lipid and cholesterol check.** / Every 5 years beginning at age 25 years.  Lung cancer screening. / Every year if you are aged 11-80 years and have a 30-pack-year history of smoking and currently smoke or have quit within the past 15 years. Yearly screening is stopped once you have quit smoking for at least 15 years or develop a health problem that would prevent you from having lung cancer treatment.  Clinical breast exam.** / Every year after age 48 years.  BRCA-related cancer risk assessment.** / For women who have family members with a BRCA-related cancer (breast, ovarian, tubal, or peritoneal cancers).  Mammogram.** / Every year beginning at age 41 years and continuing for as long as you are in good health. Consult with your health care provider.  Pap test.** / Every 3 years starting at age 65 years through age 37 or 70 years with a history of 3 consecutive normal Pap tests.  HPV screening.** / Every 3 years from ages 72 years through ages 60 to 40 years with a history of 3 consecutive normal Pap tests.  Fecal occult blood test (FOBT) of stool. / Every year beginning at age 21 years and continuing until age 5 years. You may not need to do this test if you get  a colonoscopy every 10 years.  Flexible sigmoidoscopy or colonoscopy.** / Every 5 years for a flexible sigmoidoscopy or every 10 years for a colonoscopy beginning at age 35 years and continuing until age 48 years.  Hepatitis C blood test.** / For all people born from [birth year] through 1965 and any individual with known risks for hepatitis C.  Skin self-exam. / Monthly.  Influenza vaccine. / Every year.  Tetanus, diphtheria, and acellular pertussis (Tdap/Td) vaccine.** / Consult your health care provider. Pregnant women should receive 1 dose of Tdap vaccine during each pregnancy. 1 dose of Td every 10 years.  Varicella vaccine.** / Consult your health care provider. Pregnant females who do not have evidence of immunity should receive the first dose after pregnancy.  Zoster vaccine.** / 1 dose for adults aged 30 years or older.  Measles, mumps, rubella (MMR) vaccine.** / You need at least 1 dose of MMR if you were born in 1957 or later. You may also need a second dose. For females of childbearing age, rubella immunity should be determined. If there is no evidence of immunity, females who are not pregnant should be vaccinated. If there is no evidence of immunity, females who are pregnant should delay immunization until after pregnancy.  Pneumococcal 13-valent conjugate (PCV13) vaccine.** / Consult your health care provider.  Pneumococcal polysaccharide (PPSV23) vaccine.** / 1 to 2 doses if you smoke cigarettes or if you have certain conditions.  Meningococcal vaccine.** /  Consult your health care provider.  Hepatitis A vaccine.** / Consult your health care provider.  Hepatitis B vaccine.** / Consult your health care provider.  Haemophilus influenzae type b (Hib) vaccine.** / Consult your health care provider. Ages 64 years and over  Blood pressure check.** / Every year.  Lipid and cholesterol check.** / Every 5 years beginning at age 23 years.  Lung cancer screening. / Every year if you  are aged 16-80 years and have a 30-pack-year history of smoking and currently smoke or have quit within the past 15 years. Yearly screening is stopped once you have quit smoking for at least 15 years or develop a health problem that would prevent you from having lung cancer treatment.  Clinical breast exam.** / Every year after age 74 years.  BRCA-related cancer risk assessment.** / For women who have family members with a BRCA-related cancer (breast, ovarian, tubal, or peritoneal cancers).  Mammogram.** / Every year beginning at age 44 years and continuing for as long as you are in good health. Consult with your health care provider.  Pap test.** / Every 3 years starting at age 58 years through age 22 or 39 years with 3 consecutive normal Pap tests. Testing can be stopped between 65 and 70 years with 3 consecutive normal Pap tests and no abnormal Pap or HPV tests in the past 10 years.  HPV screening.** / Every 3 years from ages 64 years through ages 70 or 61 years with a history of 3 consecutive normal Pap tests. Testing can be stopped between 65 and 70 years with 3 consecutive normal Pap tests and no abnormal Pap or HPV tests in the past 10 years.  Fecal occult blood test (FOBT) of stool. / Every year beginning at age 40 years and continuing until age 27 years. You may not need to do this test if you get a colonoscopy every 10 years.  Flexible sigmoidoscopy or colonoscopy.** / Every 5 years for a flexible sigmoidoscopy or every 10 years for a colonoscopy beginning at age 7 years and continuing until age 32 years.  Hepatitis C blood test.** / For all people born from [birth year] through 1965 and any individual with known risks for hepatitis C.  Osteoporosis screening.** / A one-time screening for women ages 30 years and over and women at risk for fractures or osteoporosis.  Skin self-exam. / Monthly.  Influenza vaccine. / Every year.  Tetanus, diphtheria, and acellular pertussis (Tdap/Td)  vaccine.** / 1 dose of Td every 10 years.  Varicella vaccine.** / Consult your health care provider.  Zoster vaccine.** / 1 dose for adults aged 35 years or older.  Pneumococcal 13-valent conjugate (PCV13) vaccine.** / Consult your health care provider.  Pneumococcal polysaccharide (PPSV23) vaccine.** / 1 dose for all adults aged 46 years and older.  Meningococcal vaccine.** / Consult your health care provider.  Hepatitis A vaccine.** / Consult your health care provider.  Hepatitis B vaccine.** / Consult your health care provider.  Haemophilus influenzae type b (Hib) vaccine.** / Consult your health care provider. ** Family history and personal history of risk and conditions may change your health care provider's recommendations.   This information is not intended to replace advice given to you by your health care provider. Make sure you discuss any questions you have with your health care provider.   Document Released: 04/14/2001 Document Revised: 03/09/2014 Document Reviewed: 07/14/2010 Elsevier Interactive Patient Education [employer].

## 2015-06-11 NOTE — Progress Notes (Signed)
Pre visit review using our clinic review tool, if applicable. No additional management support is needed unless otherwise documented below in the visit note. 

## 2015-06-13 ENCOUNTER — Encounter: Payer: Self-pay | Admitting: Family Medicine

## 2015-06-13 NOTE — Assessment & Plan Note (Signed)
See avs Check labs ghm utd 

## 2015-09-12 ENCOUNTER — Encounter: Payer: Self-pay | Admitting: Family

## 2015-09-12 ENCOUNTER — Ambulatory Visit (INDEPENDENT_AMBULATORY_CARE_PROVIDER_SITE_OTHER): Payer: Managed Care, Other (non HMO) | Admitting: Family

## 2015-09-12 VITALS — BP 118/60 | HR 75 | Temp 98.3°F | Ht 65.5 in | Wt 226.4 lb

## 2015-09-12 DIAGNOSIS — H9391 Unspecified disorder of right ear: Secondary | ICD-10-CM | POA: Diagnosis not present

## 2015-09-12 NOTE — Progress Notes (Signed)
   Subjective:    Patient ID: Jody Bryan, female    DOB: 04/07/1980, 35 y.o.   MRN: [id number]  HPI  [name] reports that a bug flew into her ear.  She was able to remove the insect but still has sensation that something is in her ear and would like her ear checked.   Review of Systems See HPI  Past Medical History  Diagnosis Date  . Chicken pox      Social History   Social History  . Marital Status: Single    Spouse Name: N/A  . Number of Children: N/A  . Years of Education: N/A   Occupational History  . Not on file.   Social History Main Topics  . Smoking status: Current Every Day Smoker  . Smokeless tobacco: Never Used  . Alcohol Use: 0.0 oz/week    0 Standard drinks or equivalent per week     Comment: social  . Drug Use: No  . Sexual Activity: Not on file   Other Topics Concern  . Not on file   Social History Narrative    No past surgical history on file.  Family History  Problem Relation Age of Onset  . Hypertension Mother   . Hypertension Father   . Alcohol abuse Sister   . Stroke Sister   . Heart disease Maternal Grandmother   . Alcohol abuse Maternal Grandfather   . Arthritis Paternal Grandmother   . Cancer Paternal Grandmother     breast    No Known Allergies  Current Outpatient Prescriptions on File Prior to Visit  Medication Sig Dispense Refill  . medroxyPROGESTERone (DEPO-PROVERA) 150 MG/ML injection Inject 150 mg into the muscle every 3 (three) months.    . clotrimazole-betamethasone (LOTRISONE) cream Apply 1 application topically 2 (two) times daily. (Patient not taking: Reported on 09/12/2015) 30 g 0  . diclofenac (VOLTAREN) 75 MG EC tablet Take 1 tablet (75 mg total) by mouth 2 (two) times daily. (Patient not taking: Reported on 06/11/2015) 30 tablet 0   No current facility-administered medications on file prior to visit.    BP 118/60 mmHg  Pulse 75  Temp(Src) 98.3 F (36.8 C) (Oral)  Ht 5' 5.5" (1.664 m)  Wt 226 lb 6.4 oz  (102.694 kg)  BMI 37.09 kg/m2  SpO2 99%       Objective:   Physical Exam  Constitutional: She is oriented to person, place, and time. She appears well-developed and well-nourished.  HENT:  Right Ear: Tympanic membrane and ear canal normal.  Left Ear: Tympanic membrane and ear canal normal.  Neurological: She is alert and oriented to person, place, and time.  Psychiatric: She has a normal mood and affect. Her behavior is normal. Judgment and thought content normal.          Assessment & Plan:  Right ear problem- normal on exam, no insect or foreign body. Reassurance provided.

## 2015-10-04 ENCOUNTER — Ambulatory Visit: Payer: Managed Care, Other (non HMO)

## 2015-10-09 ENCOUNTER — Ambulatory Visit (INDEPENDENT_AMBULATORY_CARE_PROVIDER_SITE_OTHER): Payer: Managed Care, Other (non HMO)

## 2015-10-09 ENCOUNTER — Ambulatory Visit: Payer: Managed Care, Other (non HMO)

## 2015-10-09 DIAGNOSIS — Z308 Encounter for other contraceptive management: Secondary | ICD-10-CM

## 2015-10-09 DIAGNOSIS — Z30019 Encounter for initial prescription of contraceptives, unspecified: Secondary | ICD-10-CM

## 2015-10-09 MED ORDER — MEDROXYPROGESTERONE ACETATE 150 MG/ML IM SUSP
150.0000 mg | Freq: Once | INTRAMUSCULAR | Status: AC
  Administered 2015-10-09: 150 mg via INTRAMUSCULAR

## 2015-10-09 NOTE — Progress Notes (Signed)
Pre visit review using our clinic tool,if applicable. No additional management support is needed unless otherwise documented below in the visit note.   Patirent in for Depo-Medrol injection. Given IM Left Deltoid.

## 2015-12-31 ENCOUNTER — Ambulatory Visit: Payer: Managed Care, Other (non HMO)

## 2016-01-07 ENCOUNTER — Telehealth: Payer: Self-pay | Admitting: Family Medicine

## 2016-01-07 NOTE — Telephone Encounter (Signed)
Patient would like to know when her next depo shot is due. Please advise.  Patient phone: [phone]

## 2016-01-08 ENCOUNTER — Ambulatory Visit (INDEPENDENT_AMBULATORY_CARE_PROVIDER_SITE_OTHER): Payer: Managed Care, Other (non HMO)

## 2016-01-08 ENCOUNTER — Ambulatory Visit: Payer: Managed Care, Other (non HMO)

## 2016-01-08 DIAGNOSIS — Z309 Encounter for contraceptive management, unspecified: Secondary | ICD-10-CM

## 2016-01-08 MED ORDER — MEDROXYPROGESTERONE ACETATE 150 MG/ML IM SUSP
150.0000 mg | Freq: Once | INTRAMUSCULAR | Status: AC
  Administered 2016-01-08: 150 mg via INTRAMUSCULAR

## 2016-01-08 NOTE — Progress Notes (Signed)
Pre visit review using our clinic tool,if applicable. No additional management support is needed unless otherwise documented below in the visit note.   Patient in fr Depo-Provera injection. Given IM Right hip. Patient tolerated well

## 2016-01-09 NOTE — Telephone Encounter (Signed)
Last Depo injection 01/08/16.  Next injection due: 03/25/2016 - 04/08/2016.  Pt is aware.

## 2016-03-31 ENCOUNTER — Ambulatory Visit: Payer: Managed Care, Other (non HMO)

## 2016-04-01 ENCOUNTER — Ambulatory Visit (INDEPENDENT_AMBULATORY_CARE_PROVIDER_SITE_OTHER): Payer: Managed Care, Other (non HMO) | Admitting: Behavioral Health

## 2016-04-01 DIAGNOSIS — Z308 Encounter for other contraceptive management: Secondary | ICD-10-CM

## 2016-04-01 MED ORDER — MEDROXYPROGESTERONE ACETATE 150 MG/ML IM SUSP
150.0000 mg | Freq: Once | INTRAMUSCULAR | Status: AC
Start: 2016-04-01 — End: 2016-04-01
  Administered 2016-04-01: 150 mg via INTRAMUSCULAR

## 2016-04-01 NOTE — Progress Notes (Addendum)
Pre visit review using our clinic review tool, if applicable. No additional management support is needed unless otherwise documented below in the visit note.  Patient presents in office today for Depo-Provera injection. Patient tolerated injection well. Next appointment 06/17/16 at 9:00 AM.  I reviewed RN note. Ok that she received Depo provera in office today.  Saguier, Ramon DredgeEdward, PA-C

## 2016-06-05 ENCOUNTER — Other Ambulatory Visit: Payer: Self-pay | Admitting: Family Medicine

## 2016-06-05 DIAGNOSIS — B353 Tinea pedis: Secondary | ICD-10-CM

## 2016-06-05 MED ORDER — CLOTRIMAZOLE-BETAMETHASONE 1-0.05 % EX CREA: 1.0000 "application " | TOPICAL_CREAM | Freq: Two times a day (BID) | CUTANEOUS | 0 refills | Status: DC

## 2016-06-05 NOTE — Telephone Encounter (Signed)
Cream sent in and patient informed

## 2016-06-05 NOTE — Telephone Encounter (Signed)
Caller name: Relationship to patient: Self  Can be reached: [phone]  Pharmacy:  [id number] - [place],  - [address] [phone] (Phone) [phone] (Fax)     Reason for call: Refill clotrimazole-betamethasone (LOTRISONE) cream [[id number]]

## 2016-06-17 ENCOUNTER — Ambulatory Visit (INDEPENDENT_AMBULATORY_CARE_PROVIDER_SITE_OTHER): Payer: 59

## 2016-06-17 DIAGNOSIS — Z308 Encounter for other contraceptive management: Secondary | ICD-10-CM | POA: Diagnosis not present

## 2016-06-17 MED ORDER — MEDROXYPROGESTERONE ACETATE 150 MG/ML IM SUSP
150.0000 mg | Freq: Once | INTRAMUSCULAR | Status: AC
  Administered 2016-06-17: 150 mg via INTRAMUSCULAR

## 2016-06-17 NOTE — Progress Notes (Signed)
Pre visit review using our clinic tool,if applicable. No additional management support is needed unless otherwise documented below in the visit note  Patient in for Depo Provera injection today ordered by Dr. Seabron Spates   No complaints voiced during this visit.   Given 150 mg  IM Left deltoid per patient.  Patient tolerated well. Return appointment given.

## 2016-08-24 ENCOUNTER — Ambulatory Visit (INDEPENDENT_AMBULATORY_CARE_PROVIDER_SITE_OTHER): Payer: 59 | Admitting: Family Medicine

## 2016-08-24 ENCOUNTER — Telehealth: Payer: Self-pay | Admitting: Family Medicine

## 2016-08-24 ENCOUNTER — Encounter: Payer: Self-pay | Admitting: Family Medicine

## 2016-08-24 VITALS — BP 120/86 | HR 87 | Temp 98.3°F | Resp 16 | Ht 65.5 in | Wt 250.0 lb

## 2016-08-24 DIAGNOSIS — M5442 Lumbago with sciatica, left side: Secondary | ICD-10-CM

## 2016-08-24 MED ORDER — PREDNISONE 10 MG PO TABS: ORAL_TABLET | ORAL | 0 refills | Status: DC

## 2016-08-24 MED ORDER — TRAMADOL HCL 50 MG PO TABS: 50.0000 mg | ORAL_TABLET | Freq: Four times a day (QID) | ORAL | 0 refills | Status: DC | PRN

## 2016-08-24 MED ORDER — CYCLOBENZAPRINE HCL 10 MG PO TABS: 10.0000 mg | ORAL_TABLET | Freq: Every evening | ORAL | 0 refills | Status: DC | PRN

## 2016-08-24 NOTE — Progress Notes (Signed)
Patient ID: Jody Bryan, female   DOB: 1980-10-31, 36 y.o.   MRN: [id number]     Subjective:  I acted as a Neurosurgeon for Dr. Zola Bryan.  Jody Bryan, CMA   Patient ID: Jody Bryan, female    DOB: March 22, 1980, 36 y.o.   MRN: [id number]  Chief Complaint  Patient presents with  . Back Pain    Back Pain  This is a new problem. The pain is present in the lumbar spine. The pain radiates to the right thigh and left thigh. The pain is at a severity of 9/10. The symptoms are aggravated by bending, lying down, sitting, standing, twisting and position. Associated symptoms include numbness, tingling and weakness. Pertinent negatives include no abdominal pain, bladder incontinence, bowel incontinence, chest pain, dysuria, fever, headaches or leg pain. She has tried NSAIDs for the symptoms. The treatment provided no relief.    Patient is in today for back pain.  Tried to catch a box yesterday.she felt a pop at that time.  Then she slept on her stomach last night and woke up this am -- pain 9/10 This has happened before but usually goes away on its own.  Pain radiates down L leg to knee----+ numbness and tingling   She feels like her leg is weak.    Patient Care Team: Jody Bryan, Jody Congress, DO as PCP - General (Family Medicine)   Past Medical History:  Diagnosis Date  . Chicken pox     No past surgical history on file.  Family History  Problem Relation Age of Onset  . Hypertension Mother   . Hypertension Father   . Alcohol abuse Sister   . Stroke Sister   . Heart disease Maternal Grandmother   . Alcohol abuse Maternal Grandfather   . Arthritis Paternal Grandmother   . Cancer Paternal Grandmother        breast    Social History   Social History  . Marital status: Single    Spouse name: N/A  . Number of children: N/A  . Years of education: N/A   Occupational History  . Not on file.   Social History Main Topics  . Smoking status: Current Every Day Smoker  . Smokeless tobacco:  Never Used  . Alcohol use 0.0 oz/week     Comment: social  . Drug use: No  . Sexual activity: Not on file   Other Topics Concern  . Not on file   Social History Narrative  . No narrative on file    Outpatient Medications Prior to Visit  Medication Sig Dispense Refill  . clotrimazole-betamethasone (LOTRISONE) cream Apply 1 application topically 2 (two) times daily. 30 g 0  . medroxyPROGESTERone (DEPO-PROVERA) 150 MG/ML injection Inject 150 mg into the muscle every 3 (three) months.    . diclofenac (VOLTAREN) 75 MG EC tablet Take 1 tablet (75 mg total) by mouth 2 (two) times daily. 30 tablet 0   No facility-administered medications prior to visit.     No Known Allergies  Review of Systems  Constitutional: Negative for fever and malaise/fatigue.  HENT: Negative for congestion.   Eyes: Negative for blurred vision.  Respiratory: Negative for cough and shortness of breath.   Cardiovascular: Negative for chest pain, palpitations and leg swelling.  Gastrointestinal: Negative for abdominal pain, bowel incontinence and vomiting.  Genitourinary: Negative for bladder incontinence and dysuria.  Musculoskeletal: Positive for back pain.  Skin: Negative for rash.  Neurological: Positive for tingling, weakness and numbness. Negative for loss of consciousness and  headaches.       Objective:    Physical Exam  Constitutional: She is oriented to person, place, and time. She appears well-developed and well-nourished.  HENT:  Head: Normocephalic and atraumatic.  Eyes: Conjunctivae and EOM are normal.  Neck: Normal range of motion. Neck supple. No JVD present. Carotid bruit is not present. No thyromegaly present.  Cardiovascular: Normal rate, regular rhythm and normal heart sounds.   No murmur heard. Pulmonary/Chest: Effort normal and breath sounds normal. No respiratory distress. She has no wheezes. She has no rales. She exhibits no tenderness.  Musculoskeletal: She exhibits tenderness. She  exhibits no edema.  + weakness in L Hip flexor --3/5 Weakness L foot dorsiflexion and toe   Neurological: She is alert and oriented to person, place, and time.  + slr L -<45 degrees +slr R  Slightly > 45 degrees   Psychiatric: She has a normal mood and affect.  Nursing note and vitals reviewed.   BP 120/86 (BP Location: Left Arm, Cuff Size: Large)   Pulse 87   Temp 98.3 F (36.8 C) (Oral)   Resp 16   Ht 5' 5.5" (1.664 m)   Wt 250 lb (113.4 kg)   SpO2 99%   BMI 40.97 kg/m  Wt Readings from Last 3 Encounters:  08/24/16 250 lb (113.4 kg)  09/12/15 226 lb 6.4 oz (102.7 kg)  06/11/15 207 lb 12.8 oz (94.3 kg)   BP Readings from Last 3 Encounters:  08/24/16 120/86  09/12/15 118/60  06/11/15 98/60     Immunization History  Administered Date(s) Administered  . Influenza-Unspecified 01/08/2016  . Tdap 08/01/2010    Health Maintenance  Topic Date Due  . INFLUENZA VACCINE  09/30/2016  . PAP SMEAR  06/07/2017  . TETANUS/TDAP  07/31/2020  . HIV Screening  Completed    Lab Results  Component Value Date   WBC 6.9 06/11/2015   HGB 14.2 06/11/2015   HCT 42.6 06/11/2015   PLT 289.0 06/11/2015   GLUCOSE 84 06/11/2015   CHOL 140 06/11/2015   TRIG 52.0 06/11/2015   HDL 39.00 (L) 06/11/2015   LDLCALC 90 06/11/2015   ALT 32 06/11/2015   AST 20 06/11/2015   NA 140 06/11/2015   K 4.6 06/11/2015   CL 107 06/11/2015   CREATININE 0.97 06/11/2015   BUN 9 06/11/2015   CO2 25 06/11/2015   TSH 1.28 06/11/2015   HGBA1C 5.7 06/11/2015    Lab Results  Component Value Date   TSH 1.28 06/11/2015   Lab Results  Component Value Date   WBC 6.9 06/11/2015   HGB 14.2 06/11/2015   HCT 42.6 06/11/2015   MCV 89.7 06/11/2015   PLT 289.0 06/11/2015   Lab Results  Component Value Date   NA 140 06/11/2015   K 4.6 06/11/2015   CO2 25 06/11/2015   GLUCOSE 84 06/11/2015   BUN 9 06/11/2015   CREATININE 0.97 06/11/2015   BILITOT 1.0 06/11/2015   ALKPHOS 64 06/11/2015   AST 20  06/11/2015   ALT 32 06/11/2015   PROT 7.6 06/11/2015   ALBUMIN 4.4 06/11/2015   CALCIUM 10.1 06/11/2015   GFR 82.98 06/11/2015   Lab Results  Component Value Date   CHOL 140 06/11/2015   Lab Results  Component Value Date   HDL 39.00 (L) 06/11/2015   Lab Results  Component Value Date   LDLCALC 90 06/11/2015   Lab Results  Component Value Date   TRIG 52.0 06/11/2015   Lab Results  Component  Value Date   CHOLHDL 4 06/11/2015   Lab Results  Component Value Date   HGBA1C 5.7 06/11/2015         Assessment & Plan:   Problem List Items Addressed This Visit      Unprioritized   Back pain - Primary   Relevant Medications   predniSONE (DELTASONE) 10 MG tablet   cyclobenzaprine (FLEXERIL) 10 MG tablet   traMADol (ULTRAM) 50 MG tablet   Other Relevant Orders   DG Lumbar Spine 2-3 Views (Completed)    alt ice and heat Rest If symptoms do not improve or worsen consider PT/ MRI / referral  I have discontinued Ms. Fincher's diclofenac. I am also having her start on predniSONE, cyclobenzaprine, and traMADol. Additionally, I am having her maintain her medroxyPROGESTERone and clotrimazole-betamethasone.  Meds ordered this encounter  Medications  . predniSONE (DELTASONE) 10 MG tablet    Sig: TAKE 3 TABLETS PO QD FOR 3 DAYS THEN TAKE 2 TABLETS PO QD FOR 3 DAYS THEN TAKE 1 TABLET PO QD FOR 3 DAYS THEN TAKE 1/2 TAB PO QD FOR 3 DAYS    Dispense:  20 tablet    Refill:  0  . cyclobenzaprine (FLEXERIL) 10 MG tablet    Sig: Take 1 tablet (10 mg total) by mouth at bedtime as needed for muscle spasms.    Dispense:  30 tablet    Refill:  0  . traMADol (ULTRAM) 50 MG tablet    Sig: Take 1 tablet (50 mg total) by mouth every 6 (six) hours as needed.    Dispense:  20 tablet    Refill:  0    CMA served as scribe during this visit. History, Physical and Plan performed by medical provider. Documentation and orders reviewed and attested to.  Donato SchultzYvonne R Lowne Chase, DO

## 2016-08-24 NOTE — Telephone Encounter (Signed)
Caller name:Janin Norgren Relationship to patient: Can be reached:[phone] Pharmacy:  Reason for call:[employer] will be faxing over paperwork for her FMLA, she does want to do the 10 days that was suggested at today's appointment

## 2016-08-24 NOTE — Patient Instructions (Signed)

## 2016-08-25 ENCOUNTER — Ambulatory Visit (HOSPITAL_COMMUNITY)
Admission: RE | Admit: 2016-08-25 | Discharge: 2016-08-25 | Disposition: A | Discharge status: Home / Self Care | Payer: 59 | Source: Ambulatory Visit | Attending: Family Medicine | Admitting: Family Medicine

## 2016-08-25 DIAGNOSIS — M5442 Lumbago with sciatica, left side: Secondary | ICD-10-CM | POA: Insufficient documentation

## 2016-08-26 ENCOUNTER — Telehealth: Payer: Self-pay | Admitting: *Deleted

## 2016-08-26 NOTE — Telephone Encounter (Signed)
Received [employer] paperwork with Attending Provider Statement to be completed; attached copy of 08/24/16 OV master report, forwarded to provider/SLS 06/27

## 2016-08-27 ENCOUNTER — Telehealth: Payer: Self-pay | Admitting: Family Medicine

## 2016-08-27 DIAGNOSIS — M5136 Other intervertebral disc degeneration, lumbar region: Secondary | ICD-10-CM

## 2016-08-27 NOTE — Telephone Encounter (Signed)
Requesting Xray results.

## 2016-08-27 NOTE — Telephone Encounter (Signed)
MRI ordered

## 2016-08-27 NOTE — Telephone Encounter (Signed)
Notes recorded by Conrad Burlingtonanter, Bahja Bence, CMA on 08/27/2016 at 1:06 PM EDT [facility] results and recommendations. ------  Notes recorded by Donato SchultzLowne Chase, Yvonne R, DO on 08/25/2016 at 1:57 PM EDT Disc space narrowing----- need MRI no contrast LS spine

## 2016-08-28 NOTE — Telephone Encounter (Signed)
See phone note dated 08/26/16.

## 2016-09-01 NOTE — Telephone Encounter (Signed)
Pt called in to follow up on paperwork. Please call back at: [phone].

## 2016-09-01 NOTE — Telephone Encounter (Signed)
Paperwork was faxed on 08/27/16; Patient informed, understood & agreed to p/u copy on Thursday after we reopen/SLS 07/03

## 2016-09-03 ENCOUNTER — Encounter: Payer: Self-pay | Admitting: Family Medicine

## 2016-09-03 NOTE — Telephone Encounter (Signed)
error:[id number] ° °

## 2016-09-04 ENCOUNTER — Telehealth: Payer: Self-pay | Admitting: Family Medicine

## 2016-09-04 NOTE — Telephone Encounter (Signed)
Pt called in to be advised. She would like to know if she can return to work on Monday after having MRI on this Saturday? I advised pt that based on note in chart from PCP she is okay to rtn to work as of 08/27/16.   She would like for someone to check with the provider to advise her for sure.    Please assist further.

## 2016-09-05 ENCOUNTER — Ambulatory Visit (HOSPITAL_BASED_OUTPATIENT_CLINIC_OR_DEPARTMENT_OTHER): Payer: 59

## 2016-09-07 NOTE — Telephone Encounter (Signed)
I have not received her results yet

## 2016-09-07 NOTE — Telephone Encounter (Signed)
She did not have an MRI on Saturday---it was canceled due to cost [facility]) [facility]. She stated it is scheduled for next Wednesday 09/16/16

## 2016-09-09 NOTE — Telephone Encounter (Signed)
Patient called stating her job still has not received the paper work for her leave. I  Informed the patient I seen it was faxed back to her office however it is not scanned into her chart yet, she is going to request they send it again

## 2016-09-09 NOTE — Telephone Encounter (Signed)
Patient called back to check if we have received the request for records from her employer, they state they faxed the request over this morning at 9am. They have received the completed for they just need the supporting OV notes which is what the request is.   Patient requesting call back when we have received the request and sent the OV notes over.

## 2016-09-10 NOTE — Telephone Encounter (Signed)
Patient checking on the status of forms mentioned below spoke with CMA and as per   * refaxed 09/10/16 at 3:01pm with office notes as per CMA

## 2016-09-10 NOTE — Telephone Encounter (Signed)
Spoke with [place] about this, as [employer] Claim [[id number]][[id number]] forms, along with 08/24/16 OV note, was faxed on 08/27/16; I faxed again with confirmation and [place] relayed this message to caller/SLS 07/12

## 2016-09-15 ENCOUNTER — Ambulatory Visit (INDEPENDENT_AMBULATORY_CARE_PROVIDER_SITE_OTHER): Payer: 59

## 2016-09-15 DIAGNOSIS — Z308 Encounter for other contraceptive management: Secondary | ICD-10-CM | POA: Diagnosis not present

## 2016-09-15 MED ORDER — MEDROXYPROGESTERONE ACETATE 150 MG/ML IM SUSP
150.0000 mg | Freq: Once | INTRAMUSCULAR | Status: AC
  Administered 2016-09-15: 150 mg via INTRAMUSCULAR

## 2016-09-15 NOTE — Progress Notes (Signed)
Reviewed  Jaryan Chicoine R Lowne Chase, DO  

## 2016-09-15 NOTE — Progress Notes (Signed)
Pre visit review using our clinic tool,if applicable. No additional management support is needed unless otherwise documented below in the visit note.   Patient in for Depo Provera 150 mg injection per order from Dr. Marya FossaY. Lowne-Chase.   Given IM Right deltoid per patient request. Patient tolerated well.   Return appointment scheduled for December 01, 2016 for next injection.

## 2016-09-18 ENCOUNTER — Telehealth: Payer: Self-pay | Admitting: Family Medicine

## 2016-09-18 DIAGNOSIS — R937 Abnormal findings on diagnostic imaging of other parts of musculoskeletal system: Secondary | ICD-10-CM

## 2016-09-18 DIAGNOSIS — M5136 Other intervertebral disc degeneration, lumbar region: Secondary | ICD-10-CM

## 2016-09-18 NOTE — Telephone Encounter (Signed)
Caller name: Relation to pt: Call back number: Pharmacy:  Reason for call: pt bought in the CD of her MRI , Dr. Laury AxonLowne will need to request the results are sent to her, because unable to see results on the CD, CD was given back to the pt. [facility] is where MRI was done.

## 2016-09-23 NOTE — Telephone Encounter (Signed)
MRI in chart, neurosurgeon referral placed. Is Pt aware of results?

## 2016-09-23 NOTE — Telephone Encounter (Signed)
Results in Care Everywhere, dated 09/16/2016.

## 2016-09-23 NOTE — Telephone Encounter (Signed)
She dropped off disc--- not sure if she knows results  Bulging discs and stenosis

## 2016-09-23 NOTE — Telephone Encounter (Signed)
Please print mri results and scan in chart Pt needs neuro surgeon referral

## 2016-09-23 NOTE — Telephone Encounter (Signed)
LMOM informing Pt of MRI results and referral. Instructed to call if questions/concerns.

## 2016-10-21 NOTE — Telephone Encounter (Signed)
Pt called stating still have not received fax, pt would like to have documents faxed again but to a new faxed number: [phone] Attention to claim number [id number]. Please advise ASAP.

## 2016-10-23 NOTE — Telephone Encounter (Signed)
Paperwork was originally faxed on 08/27/16 to [phone], then resent via fax to the requested number [[phone] with Claim number [id number]] on the fax cover sheet with the 08/24/16 Office note as requested by [employer] in paperwork [received confirmation on 2nd fax 09/09/16]. I have resent all paperwork via fax, same as before/SLS 08/24

## 2016-10-27 ENCOUNTER — Ambulatory Visit (INDEPENDENT_AMBULATORY_CARE_PROVIDER_SITE_OTHER): Payer: 59 | Admitting: Family Medicine

## 2016-10-27 ENCOUNTER — Encounter: Payer: Self-pay | Admitting: Family Medicine

## 2016-10-27 VITALS — BP 134/80 | HR 81 | Temp 98.1°F | Resp 14 | Ht 66.0 in | Wt 263.2 lb

## 2016-10-27 DIAGNOSIS — M5442 Lumbago with sciatica, left side: Secondary | ICD-10-CM | POA: Diagnosis not present

## 2016-10-27 DIAGNOSIS — R600 Localized edema: Secondary | ICD-10-CM

## 2016-10-27 MED ORDER — CYCLOBENZAPRINE HCL 10 MG PO TABS: 10.0000 mg | ORAL_TABLET | Freq: Every evening | ORAL | 0 refills | Status: DC | PRN

## 2016-10-27 MED ORDER — HYDROCHLOROTHIAZIDE 25 MG PO TABS: 25.0000 mg | ORAL_TABLET | Freq: Every day | ORAL | 3 refills | Status: DC

## 2016-10-27 MED ORDER — TRAMADOL HCL 50 MG PO TABS: 50.0000 mg | ORAL_TABLET | Freq: Four times a day (QID) | ORAL | 0 refills | Status: DC | PRN

## 2016-10-27 NOTE — Patient Instructions (Signed)
DASH Eating Plan DASH stands for "Dietary Approaches to Stop Hypertension." The DASH eating plan is a healthy eating plan that has been shown to reduce high blood pressure (hypertension). It may also reduce your risk for type 2 diabetes, heart disease, and stroke. The DASH eating plan may also help with weight loss. What are tips for following this plan? General guidelines  Avoid eating more than 2,300 mg (milligrams) of salt (sodium) a day. If you have hypertension, you may need to reduce your sodium intake to 1,500 mg a day.  Limit alcohol intake to no more than 1 drink a day for nonpregnant women and 2 drinks a day for men. One drink equals 12 oz of beer, 5 oz of wine, or 1 oz of hard liquor.  Work with your health care provider to maintain a healthy body weight or to lose weight. Ask what an ideal weight is for you.  Get at least 30 minutes of exercise that causes your heart to beat faster (aerobic exercise) most days of the week. Activities may include walking, swimming, or biking.  Work with your health care provider or diet and nutrition specialist (dietitian) to adjust your eating plan to your individual calorie needs. Reading food labels  Check food labels for the amount of sodium per serving. Choose foods with less than 5 percent of the Daily Value of sodium. Generally, foods with less than 300 mg of sodium per serving fit into this eating plan.  To find whole grains, look for the word "whole" as the first word in the ingredient list. Shopping  Buy products labeled as "low-sodium" or "no salt added."  Buy fresh foods. Avoid canned foods and premade or frozen meals. Cooking  Avoid adding salt when cooking. Use salt-free seasonings or herbs instead of table salt or sea salt. Check with your health care provider or pharmacist before using salt substitutes.  Do not fry foods. Cook foods using healthy methods such as baking, boiling, grilling, and broiling instead.  Cook with  heart-healthy oils, such as olive, canola, soybean, or sunflower oil. Meal planning   Eat a balanced diet that includes: ? 5 or more servings of fruits and vegetables each day. At each meal, try to fill half of your plate with fruits and vegetables. ? Up to 6-8 servings of whole grains each day. ? Less than 6 oz of lean meat, poultry, or fish each day. A 3-oz serving of meat is about the same size as a deck of cards. One egg equals 1 oz. ? 2 servings of low-fat dairy each day. ? A serving of nuts, seeds, or beans 5 times each week. ? Heart-healthy fats. Healthy fats called Omega-3 fatty acids are found in foods such as flaxseeds and coldwater fish, like sardines, salmon, and mackerel.  Limit how much you eat of the following: ? Canned or prepackaged foods. ? Food that is high in trans fat, such as fried foods. ? Food that is high in saturated fat, such as fatty meat. ? Sweets, desserts, sugary drinks, and other foods with added sugar. ? Full-fat dairy products.  Do not salt foods before eating.  Try to eat at least 2 vegetarian meals each week.  Eat more home-cooked food and less restaurant, buffet, and fast food.  When eating at a restaurant, ask that your food be prepared with less salt or no salt, if possible. What foods are recommended? The items listed may not be a complete list. Talk with your dietitian about what   dietary choices are best for you. Grains Whole-grain or whole-wheat bread. Whole-grain or whole-wheat pasta. Brown rice. Oatmeal. Quinoa. Bulgur. Whole-grain and low-sodium cereals. Pita bread. Low-fat, low-sodium crackers. Whole-wheat flour tortillas. Vegetables Fresh or frozen vegetables (raw, steamed, roasted, or grilled). Low-sodium or reduced-sodium tomato and vegetable juice. Low-sodium or reduced-sodium tomato sauce and tomato paste. Low-sodium or reduced-sodium canned vegetables. Fruits All fresh, dried, or frozen fruit. Canned fruit in natural juice (without  added sugar). Meat and other protein foods Skinless chicken or turkey. Ground chicken or turkey. Pork with fat trimmed off. Fish and seafood. Egg whites. Dried beans, peas, or lentils. Unsalted nuts, nut butters, and seeds. Unsalted canned beans. Lean cuts of beef with fat trimmed off. Low-sodium, lean deli meat. Dairy Low-fat (1%) or fat-free (skim) milk. Fat-free, low-fat, or reduced-fat cheeses. Nonfat, low-sodium ricotta or cottage cheese. Low-fat or nonfat yogurt. Low-fat, low-sodium cheese. Fats and oils Soft margarine without trans fats. Vegetable oil. Low-fat, reduced-fat, or light mayonnaise and salad dressings (reduced-sodium). Canola, safflower, olive, soybean, and sunflower oils. Avocado. Seasoning and other foods Herbs. Spices. Seasoning mixes without salt. Unsalted popcorn and pretzels. Fat-free sweets. What foods are not recommended? The items listed may not be a complete list. Talk with your dietitian about what dietary choices are best for you. Grains Baked goods made with fat, such as croissants, muffins, or some breads. Dry pasta or rice meal packs. Vegetables Creamed or fried vegetables. Vegetables in a cheese sauce. Regular canned vegetables (not low-sodium or reduced-sodium). Regular canned tomato sauce and paste (not low-sodium or reduced-sodium). Regular tomato and vegetable juice (not low-sodium or reduced-sodium). Pickles. Olives. Fruits Canned fruit in a light or heavy syrup. Fried fruit. Fruit in cream or butter sauce. Meat and other protein foods Fatty cuts of meat. Ribs. Fried meat. Bacon. Sausage. Bologna and other processed lunch meats. Salami. Fatback. Hotdogs. Bratwurst. Salted nuts and seeds. Canned beans with added salt. Canned or smoked fish. Whole eggs or egg yolks. Chicken or turkey with skin. Dairy Whole or 2% milk, cream, and half-and-half. Whole or full-fat cream cheese. Whole-fat or sweetened yogurt. Full-fat cheese. Nondairy creamers. Whipped toppings.  Processed cheese and cheese spreads. Fats and oils Butter. Stick margarine. Lard. Shortening. Ghee. Bacon fat. Tropical oils, such as coconut, palm kernel, or palm oil. Seasoning and other foods Salted popcorn and pretzels. Onion salt, garlic salt, seasoned salt, table salt, and sea salt. Worcestershire sauce. Tartar sauce. Barbecue sauce. Teriyaki sauce. Soy sauce, including reduced-sodium. Steak sauce. Canned and packaged gravies. Fish sauce. Oyster sauce. Cocktail sauce. Horseradish that you find on the shelf. Ketchup. Mustard. Meat flavorings and tenderizers. Bouillon cubes. Hot sauce and Tabasco sauce. Premade or packaged marinades. Premade or packaged taco seasonings. Relishes. Regular salad dressings. Where to find more information:  National Heart, Lung, and Blood Institute: www.nhlbi.nih.gov  American Heart Association: www.heart.org Summary  The DASH eating plan is a healthy eating plan that has been shown to reduce high blood pressure (hypertension). It may also reduce your risk for type 2 diabetes, heart disease, and stroke.  With the DASH eating plan, you should limit salt (sodium) intake to 2,300 mg a day. If you have hypertension, you may need to reduce your sodium intake to 1,500 mg a day.  When on the DASH eating plan, aim to eat more fresh fruits and vegetables, whole grains, lean proteins, low-fat dairy, and heart-healthy fats.  Work with your health care provider or diet and nutrition specialist (dietitian) to adjust your eating plan to your individual   calorie needs. This information is not intended to replace advice given to you by your health care provider. Make sure you discuss any questions you have with your health care provider. Document Released: 02/05/2011 Document Revised: 02/10/2016 Document Reviewed: 02/10/2016 Elsevier Interactive Patient Education  2017 Elsevier Inc.  
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
## 2016-10-27 NOTE — Progress Notes (Signed)
Pre visit review using our clinic review tool, if applicable. No additional management support is needed unless otherwise documented below in the visit note. 

## 2016-10-28 LAB — BASIC METABOLIC PANEL
BUN: 11 mg/dL (ref 6–23)
CALCIUM: 9.8 mg/dL (ref 8.4–10.5)
CO2: 26 meq/L (ref 19–32)
Chloride: 108 mEq/L (ref 96–112)
Creatinine, Ser: 0.9 mg/dL (ref 0.40–1.20)
GFR: 89.81 mL/min (ref >60.00)
GLUCOSE: 81 mg/dL (ref 70–99)
Potassium: 4.5 mEq/L (ref 3.5–5.1)
SODIUM: 140 meq/L (ref 135–145)

## 2016-11-10 ENCOUNTER — Ambulatory Visit (INDEPENDENT_AMBULATORY_CARE_PROVIDER_SITE_OTHER): Payer: 59 | Admitting: Family Medicine

## 2016-11-10 ENCOUNTER — Encounter: Payer: Self-pay | Admitting: Family Medicine

## 2016-11-10 VITALS — BP 110/72 | HR 84 | Temp 99.1°F | Ht 66.0 in | Wt 259.8 lb

## 2016-11-10 DIAGNOSIS — M5442 Lumbago with sciatica, left side: Secondary | ICD-10-CM

## 2016-11-10 DIAGNOSIS — M5441 Lumbago with sciatica, right side: Secondary | ICD-10-CM | POA: Diagnosis not present

## 2016-11-10 MED ORDER — CARISOPRODOL 350 MG PO TABS: 350.0000 mg | ORAL_TABLET | Freq: Four times a day (QID) | ORAL | 0 refills | Status: DC | PRN

## 2016-11-10 MED ORDER — MELOXICAM 15 MG PO TABS: 15.0000 mg | ORAL_TABLET | Freq: Every day | ORAL | 0 refills | Status: DC
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
## 2016-11-10 NOTE — Patient Instructions (Signed)

## 2016-11-18 ENCOUNTER — Telehealth: Payer: Self-pay | Admitting: Family Medicine

## 2016-11-18 NOTE — Telephone Encounter (Signed)
Relation to PI:RJJO Call back number:  [phone]  Reason for call:  Patient states medication prescribed for back pain is not working, patient states 2nd day back at work the medication is not releaving any pain, informed patient PCP is out of the office and will follow up tomorrow, please advise

## 2016-11-19 NOTE — Telephone Encounter (Signed)
Ultram 50 mg #30  1 po q6h prn -- try it at home first

## 2016-11-20 ENCOUNTER — Telehealth: Payer: Self-pay | Admitting: *Deleted

## 2016-11-20 MED ORDER — TRAMADOL HCL 50 MG PO TABS: 50.0000 mg | ORAL_TABLET | Freq: Four times a day (QID) | ORAL | 0 refills | Status: DC | PRN

## 2016-11-20 NOTE — Telephone Encounter (Signed)
Received Disability and Leave paperwork from [employer], will complete as much as possible; then forward to provider/SLS 09/21

## 2016-11-20 NOTE — Telephone Encounter (Signed)
Received successful confirmation. Please see other phone message about paper work

## 2016-11-20 NOTE — Telephone Encounter (Signed)
Rx signed and faxed, awaiting confirmation

## 2016-11-20 NOTE — Telephone Encounter (Signed)
I have not worked on paperwork since Tuesday, Testing all day on Wednesday and out yesterday and Lavenia Atlas mostly been finishing my overdue testing this morning. Kaylyn worked on paperwork yesterday, but there should be a note if she had received it.

## 2016-11-20 NOTE — Telephone Encounter (Signed)
Spoke with pt and she agreed to the medication. Rx printed and will be faxed to her pharmacy of choice once signed   Jody Bryan December- Pt states she faxed FMLA paper work yesterday she just wanted to know have you received them yet

## 2016-11-23 NOTE — Telephone Encounter (Signed)
Forwarded LOA paperwork, along with copies of last [FMLA] done in June '18 and last OV to provider/SLS 09/24

## 2016-11-27 NOTE — Telephone Encounter (Signed)
Pt called in to follow up on paper work for visit DOS 11/09/16. Pt says that she was suppose to have had it sent in by 11/26/16 so now her case is closed. Pt says that they said that they would still work with her if she go ahead and get forms sent in. She said at completion of form we have to put case ref # provided below.    Case# [id number]    Advised pt that PCP is out of the office until Thursday.

## 2016-11-27 NOTE — Telephone Encounter (Signed)
Spoke with patient and retrieved all information needed to complete this paperwork; she will contact her LOA specialist to inform provider out of office until Thursday for extension, will have ready for PCP to sign upon her return/SLS 09/28

## 2016-12-01 ENCOUNTER — Ambulatory Visit (INDEPENDENT_AMBULATORY_CARE_PROVIDER_SITE_OTHER): Payer: 59

## 2016-12-01 DIAGNOSIS — Z30019 Encounter for initial prescription of contraceptives, unspecified: Secondary | ICD-10-CM

## 2016-12-01 MED ORDER — MEDROXYPROGESTERONE ACETATE 150 MG/ML IM SUSP
150.0000 mg | Freq: Once | INTRAMUSCULAR | Status: AC
  Administered 2016-12-01: 150 mg via INTRAMUSCULAR

## 2016-12-03 NOTE — Telephone Encounter (Signed)
Paperwork faxed to [employer] at [phone]

## 2016-12-07 ENCOUNTER — Other Ambulatory Visit: Payer: Self-pay | Admitting: Family Medicine

## 2016-12-07 DIAGNOSIS — M5441 Lumbago with sciatica, right side: Secondary | ICD-10-CM

## 2016-12-07 DIAGNOSIS — M5442 Lumbago with sciatica, left side: Principal | ICD-10-CM

## 2016-12-30 ENCOUNTER — Telehealth: Payer: Self-pay | Admitting: Family Medicine

## 2016-12-30 NOTE — Telephone Encounter (Signed)
Pt wants confirmation FMLA papers were faxed before 11/30/16. Please call pt advise. Informed pt of previous notes that pt was informed to ask LOA dept at employer for extension on due date. Pt states it was other paperwork about original onset she is inquiring about. Call pt at [phone].

## 2016-12-30 NOTE — Telephone Encounter (Signed)
°  Relation to ZO:XWRUpt:self Call back number:[phone]   Reason for call:  Patient states [employer] will not cover  11/09/16 thru 11/13/16 due to paperwork being faxed after the fact. Informed patient office received paperwork 11/20/16, patient would like to discuss,please advise

## 2016-12-30 NOTE — Telephone Encounter (Signed)
Lauralee EvenerBracken, Carla D      12/30/16 9:40 AM  Note    Pt wants confirmation FMLA papers were faxed before 11/30/16. Please call pt advise. Informed pt of previous notes that pt was informed to ask LOA dept at employer for extension on due date. Pt states it was other paperwork about original onset she is inquiring about. Call pt at [phone].

## 2016-12-31 NOTE — Telephone Encounter (Signed)
Pt called & had LOA on the line. She states that they are not covering her due papers not sent before 11/26/16. Reading back in the encounters it looks like we advised patient to let LOA know that provider was out of the office & to extend the due date for documentation. Patient is looking for fax confirmation reflecting the date that the paperwork was faxed.

## 2016-12-31 NOTE — Telephone Encounter (Signed)
LMOM with contact name and number for return call RE: LOA paperwork and our phone conversation when Dr. Zola ButtonLowne-Chase was out of town and unavailable to sign off; therefore, we could not fax to [employer] until she returned. At that time, pt understood & agreed to call her LOA specialist and relay this message to have her deadline extended and we have not been told there was a problem with this matter until now. I will need to forward her request to Dr. Zola ButtonLowne-Chase, as provider will have the final say as to what she will do; I have pulled the paperwork and awaiting call back from patient/SLS 11/02  Conversation: Advice Only  (Newest Message First)  December 31, 2016    3:57 PM  Hollice Gongliphant, Kimberly D routed this conversation to Me   3:51 PM  Note    Pt called & had LOA on the line. She states that they are not covering her due papers not sent before 11/26/16. Reading back in the encounters it looks like we advised patient to let LOA know that provider was out of the office & to extend the due date for documentation. Patient is looking for fax confirmation reflecting the date that the paperwork was faxed.     December 30, 2016    9:42 AM  Lauralee EvenerBracken, Carla D routed this conversation to Me   9:40 AM  Note    Pt wants confirmation FMLA papers were faxed before 11/30/16. Please call pt advise. Informed pt of previous notes that pt was informed to ask LOA dept at employer for extension on due date. Pt states it was other paperwork about original onset she is inquiring about. Call pt at [phone].

## 2017-01-01 NOTE — Telephone Encounter (Signed)
Copy & pasted information to original 11/20/16 phone note so all information is tethered together/SLS 11/02

## 2017-02-15 DIAGNOSIS — Z30019 Encounter for initial prescription of contraceptives, unspecified: Secondary | ICD-10-CM | POA: Diagnosis not present

## 2017-02-15 MED ORDER — MEDROXYPROGESTERONE ACETATE 150 MG/ML IM SUSP
150.0000 mg | Freq: Once | INTRAMUSCULAR | Status: AC
  Administered 2017-02-15: 150 mg via INTRAMUSCULAR

## 2017-02-15 NOTE — Progress Notes (Signed)
Pre visit review using our clinic tool,if applicable. No additional management support is needed unless otherwise documented below in the visit note.   Patient in for Depo Provera injection per order from Dr. Zola ButtonLowne-Chase.   Given 150 mg IM left deltoid per patient request. Patietn tolerated well.   Return appointment scheduled for 3 months.

## 2017-02-16 ENCOUNTER — Ambulatory Visit (INDEPENDENT_AMBULATORY_CARE_PROVIDER_SITE_OTHER): Payer: 59

## 2017-02-16 DIAGNOSIS — Z30019 Encounter for initial prescription of contraceptives, unspecified: Secondary | ICD-10-CM

## 2017-03-04 ENCOUNTER — Other Ambulatory Visit: Payer: Self-pay | Admitting: Family Medicine

## 2017-03-04 ENCOUNTER — Encounter (INDEPENDENT_AMBULATORY_CARE_PROVIDER_SITE_OTHER): Payer: Self-pay

## 2017-03-05 ENCOUNTER — Encounter: Payer: Self-pay | Admitting: Family Medicine

## 2017-03-05 NOTE — Telephone Encounter (Signed)
She can take both in same day

## 2017-03-08 ENCOUNTER — Other Ambulatory Visit: Payer: Self-pay

## 2017-03-08 ENCOUNTER — Other Ambulatory Visit: Payer: Self-pay | Admitting: Family Medicine

## 2017-03-08 MED ORDER — TRAMADOL HCL 50 MG PO TABS: 50.0000 mg | ORAL_TABLET | Freq: Four times a day (QID) | ORAL | 0 refills | Status: DC | PRN

## 2017-03-08 MED ORDER — TRAMADOL HCL 50 MG PO TABS: 50.0000 mg | ORAL_TABLET | Freq: Four times a day (QID) | ORAL | 1 refills | Status: DC | PRN

## 2017-03-08 NOTE — Telephone Encounter (Signed)
Last filled per database: 11/20/2016 Last written: 11/20/2016  Last ov: 11/10/2016 Next ov: 05/05/2017 Contract: N/A UDS: N/A

## 2017-03-17 ENCOUNTER — Ambulatory Visit (INDEPENDENT_AMBULATORY_CARE_PROVIDER_SITE_OTHER): Payer: 59 | Admitting: Family Medicine

## 2017-03-17 ENCOUNTER — Encounter (INDEPENDENT_AMBULATORY_CARE_PROVIDER_SITE_OTHER): Payer: Self-pay | Admitting: Family Medicine

## 2017-03-17 VITALS — BP 138/83 | HR 75 | Temp 97.8°F | Ht 65.0 in | Wt 259.0 lb

## 2017-03-17 DIAGNOSIS — R030 Elevated blood-pressure reading, without diagnosis of hypertension: Secondary | ICD-10-CM | POA: Diagnosis not present

## 2017-03-17 DIAGNOSIS — R5383 Other fatigue: Secondary | ICD-10-CM | POA: Diagnosis not present

## 2017-03-17 DIAGNOSIS — Z6841 Body Mass Index (BMI) 40.0 and over, adult: Secondary | ICD-10-CM

## 2017-03-17 DIAGNOSIS — R0602 Shortness of breath: Secondary | ICD-10-CM | POA: Diagnosis not present

## 2017-03-17 DIAGNOSIS — O139 Gestational [pregnancy-induced] hypertension without significant proteinuria, unspecified trimester: Secondary | ICD-10-CM | POA: Insufficient documentation

## 2017-03-17 DIAGNOSIS — Z1331 Encounter for screening for depression: Secondary | ICD-10-CM | POA: Diagnosis not present

## 2017-03-17 DIAGNOSIS — Z0289 Encounter for other administrative examinations: Secondary | ICD-10-CM

## 2017-03-17 NOTE — Progress Notes (Signed)
Office: [phone]  /  Fax: [phone]   Dear Dr. Zola Button,   Thank you for referring Jody Bryan to our clinic. The following note includes my evaluation and treatment recommendations.  HPI:   Chief Complaint: OBESITY    Jody Bryan has been Bryan by Donato Schultz, DO,  for consultation regarding her obesity and obesity related comorbidities.    Jody Bryan (MR# [id number]) is a 37 y.o. female who presents on 03/17/2017 for obesity evaluation and treatment. Current BMI is Body mass index is 43.1 kg/m.Marland Kitchen Jody Bryan has been struggling with her weight for many years and has been unsuccessful in either losing weight, maintaining weight loss, or reaching her healthy weight goal.     Jody Bryan attended our information session and states Jody Bryan is currently in the action stage of change and ready to dedicate time achieving and maintaining a healthier weight. Jody Bryan is interested in becoming our patient and working on intensive lifestyle modifications including (but not limited to) diet, exercise and weight loss.    Jody Bryan states her family eats meals together Jody Bryan struggles with family and or coworkers weight loss sabotage her desired weight loss is 79 lbs Jody Bryan has been heavy most of  her life Jody Bryan started gaining weight 2 years ago her heaviest weight ever was 259 lbs. Jody Bryan has significant food cravings issues  Jody Bryan snacks frequently in the evenings Jody Bryan skips meals frequently Jody Bryan is frequently drinking liquids with calories Jody Bryan frequently makes poor food choices Jody Bryan frequently eats larger portions than normal  Jody Bryan struggles with emotional eating    Fatigue Jody Bryan feels her energy is lower than it should be. This has worsened with weight gain and has not worsened recently. Jody Bryan admits to daytime somnolence and  admits to waking up still tired. Patient is at risk for obstructive sleep apnea. Patent has a history of symptoms of daytime fatigue. Patient  generally gets 6 hours of sleep per night, and states they generally have nightime awakenings. Snoring is present. Apneic episodes are not present. Epworth Sleepiness Score is 5  Dyspnea on exertion Jody Bryan notes increasing shortness of breath with exercising and seems to be worsening over time with weight gain. Jody Bryan notes getting out of breath sooner with activity than Jody Bryan used to. This has not gotten worse recently. Jody Bryan denies orthopnea.  Pre-Hypertension Jody Bryan is a 37 y.o. female with pre-hypertension. Jody Bryan's blood pressure is borderline elevated, worse with weight gain. Jody Bryan is not on medications and Jody Bryan denies chest pain or headache. Jody Bryan is working weight loss to help control her blood pressure with the goal of decreasing her risk of heart attack and stroke. Jody Bryan's blood pressure is not currently controlled.  Depression Screen Jody Bryan's Food and Mood (modified PHQ-9) score was  Depression screen PHQ 2/9 03/17/2017  Decreased Interest 2  Down, Depressed, Hopeless 3  PHQ - 2 Score 5  Altered sleeping 3  Tired, decreased energy 3  Change in appetite 1  Feeling bad or failure about yourself  1  Trouble concentrating 0  Moving slowly or fidgety/restless 1  Suicidal thoughts 0  PHQ-9 Score 14  Difficult doing work/chores Extremely dIfficult    ALLERGIES: No Known Allergies  MEDICATIONS: Current Outpatient Medications on File Prior to Visit  Medication Sig Dispense Refill  . carisoprodol (SOMA) 350 MG tablet Take 1 tablet (350 mg total) by mouth 4 (four) times daily as needed for muscle spasms. 30 tablet 0  . clotrimazole-betamethasone (LOTRISONE) cream Apply 1 application topically 2 (two) times  daily. 30 g 0  . hydrochlorothiazide (HYDRODIURIL) 25 MG tablet Take 1 tablet (25 mg total) by mouth daily. 90 tablet 3  . medroxyPROGESTERone (DEPO-PROVERA) 150 MG/ML injection Inject 150 mg into the muscle every 3 (three) months.    . naproxen sodium (ALEVE) 220 MG  tablet Take 220 mg by mouth 2 (two) times daily as needed.    . Polyvinyl Alcohol-Povidone (CLEAR EYES ALL SEASONS) 5-6 MG/ML SOLN Apply 1 drop to eye daily as needed.    . traMADol (ULTRAM) 50 MG tablet Take 1 tablet (50 mg total) by mouth every 6 (six) hours as needed. 30 tablet 1  . meloxicam (MOBIC) 15 MG tablet TAKE 1 TABLET BY MOUTH EVERY DAY 30 tablet 0  . predniSONE (DELTASONE) 10 MG tablet TAKE 3 TABLETS PO QD FOR 3 DAYS THEN TAKE 2 TABLETS PO QD FOR 3 DAYS THEN TAKE 1 TABLET PO QD FOR 3 DAYS THEN TAKE 1/2 TAB PO QD FOR 3 DAYS 20 tablet 0   No current facility-administered medications on file prior to visit.     PAST MEDICAL HISTORY: Past Medical History:  Diagnosis Date  . Bilateral leg edema   . Chicken pox   . Depression   . Drug use   . Dyspnea   . GERD (gastroesophageal reflux disease)   . HTN (hypertension)   . Low back pain     PAST SURGICAL HISTORY: History reviewed. No pertinent surgical history.  SOCIAL HISTORY: Social History   Tobacco Use  . Smoking status: Current Some Day Smoker    Years: 2.00    Types: Cigarettes  . Smokeless tobacco: Never Used  Substance Use Topics  . Alcohol use: Yes    Alcohol/week: 0.0 oz    Comment: social  . Drug use: No    FAMILY HISTORY: Family History  Problem Relation Age of Onset  . Hypertension Mother   . Obesity Mother   . Hypertension Father   . Depression Father   . Sleep apnea Father   . Drug abuse Father   . Obesity Father   . Arthritis Paternal Grandmother   . Cancer Paternal Grandmother        breast  . Alcohol abuse Sister   . Stroke Sister   . Heart disease Maternal Grandmother   . Alcohol abuse Maternal Grandfather     ROS: Review of Systems  Constitutional: Positive for malaise/fatigue. Negative for weight loss.       Trouble sleeping  HENT:       Decreased hearing  Eyes:       Floaters   Respiratory: Positive for shortness of breath (with exertion).        Can hear herself breathing  heavy  Cardiovascular: Negative for chest pain and orthopnea.  Musculoskeletal: Positive for back pain.       Neck stiffness  Skin:       Hair or nail changes  Neurological: Negative for headaches.  Psychiatric/Behavioral: Positive for depression. Negative for suicidal ideas.       Stress     PHYSICAL EXAM: Blood pressure 138/83, pulse 75, temperature 97.8 F (36.6 C), temperature source Oral, height 5\' 5"  (1.651 m), weight 259 lb (117.5 kg), SpO2 100 %. Body mass index is 43.1 kg/m. Physical Exam  Constitutional: Jody Bryan is oriented to person, place, and time. Jody Bryan appears well-developed and well-nourished.  HENT:  Head: Normocephalic and atraumatic.  Nose: Nose normal.  Eyes: EOM are normal. No scleral icterus.  Neck: Normal range  of motion. Neck supple. No thyromegaly present.  Cardiovascular: Normal rate and regular rhythm.  Pulmonary/Chest: Effort normal. No respiratory distress.  Abdominal: Soft. There is no tenderness.  + obesity  Musculoskeletal: Normal range of motion.  Range of Motion normal in all 4 extremities Trace edema noted in bilateral lower extremities  Neurological: Jody Bryan is oriented to person, place, and time. Coordination normal.  Skin: Skin is warm and dry.  Psychiatric: Jody Bryan has a normal mood and affect. Her behavior is normal.  Vitals reviewed.   RECENT LABS AND TESTS: BMET    Component Value Date/Time   NA 140 10/27/2016 1804   K 4.5 10/27/2016 1804   CL 108 10/27/2016 1804   CO2 26 10/27/2016 1804   GLUCOSE 81 10/27/2016 1804   BUN 11 10/27/2016 1804   CREATININE 0.90 10/27/2016 1804   CREATININE 0.82 06/08/2014 1627   CALCIUM 9.8 10/27/2016 1804   Lab Results  Component Value Date   HGBA1C 5.7 06/11/2015   No results found for: INSULIN CBC    Component Value Date/Time   WBC 6.9 06/11/2015 1024   RBC 4.74 06/11/2015 1024   HGB 14.2 06/11/2015 1024   HCT 42.6 06/11/2015 1024   PLT 289.0 06/11/2015 1024   MCV 89.7 06/11/2015 1024   MCH  30.1 06/08/2014 1627   MCHC 33.5 06/11/2015 1024   RDW 13.5 06/11/2015 1024   LYMPHSABS 2.3 06/11/2015 1024   MONOABS 0.6 06/11/2015 1024   EOSABS 0.0 06/11/2015 1024   BASOSABS 0.0 06/11/2015 1024   Iron/TIBC/Ferritin/ %Sat No results found for: IRON, TIBC, FERRITIN, IRONPCTSAT Lipid Panel     Component Value Date/Time   CHOL 140 06/11/2015 1024   TRIG 52.0 06/11/2015 1024   HDL 39.00 (L) 06/11/2015 1024   CHOLHDL 4 06/11/2015 1024   VLDL 10.4 06/11/2015 1024   LDLCALC 90 06/11/2015 1024   Hepatic Function Panel     Component Value Date/Time   PROT 7.6 06/11/2015 1024   ALBUMIN 4.4 06/11/2015 1024   AST 20 06/11/2015 1024   ALT 32 06/11/2015 1024   ALKPHOS 64 06/11/2015 1024   BILITOT 1.0 06/11/2015 1024   BILIDIR 0.2 06/11/2015 1024   IBILI 0.6 06/08/2014 1627      Component Value Date/Time   TSH 1.28 06/11/2015 1024   TSH 1.680 06/08/2014 1627   TSH 0.83 05/25/2012 1106    ECG  shows NSR with a rate of 78 BPM INDIRECT CALORIMETER done today shows a VO2 of 184 and a REE of 1284.  Her calculated basal metabolic rate is 1610 thus her basal metabolic rate is worse than expected.    ASSESSMENT AND PLAN: Other fatigue - Plan: EKG 12-Lead, Vitamin B12, CBC With Differential, Folate, Hemoglobin A1c, Insulin, random, T3, T4, free, TSH, VITAMIN D 25 Hydroxy (Vit-D Deficiency, Fractures)  Shortness of breath on exertion - Plan: Lipid Panel With LDL/HDL Ratio  Pre-hypertension - Plan: Comprehensive metabolic panel  Depression screening  Class 3 severe obesity with serious comorbidity and body mass index (BMI) of 40.0 to 44.9 in adult, unspecified obesity type (HCC)  PLAN:  Fatigue Jody Bryan was informed that her fatigue may be related to obesity, depression or many other causes. Labs will be ordered, and in the meanwhile Jody Bryan has agreed to work on diet, exercise and weight loss to help with fatigue. Proper sleep hygiene was discussed including the need for 7-8  hours of quality sleep each night. A sleep study was not ordered based on symptoms and Epworth score.  Dyspnea on exertion Jody Bryan's shortness of breath appears to be obesity related and exercise induced. Jody Bryan has agreed to work on weight loss and gradually increase exercise to treat her exercise induced shortness of breath. If Jody Bryan follows our instructions and loses weight without improvement of her shortness of breath, we will plan to refer to pulmonology. We will monitor this condition regularly. Jody Bryan agrees to this plan.  Pre-Hypertension We discussed sodium restriction, working on healthy weight loss, diet, and a regular exercise program as the means to achieve improved blood pressure control. Jody Bryan agreed with this plan and agreed to follow up as directed. We will continue to monitor her blood pressure as well as her progress with the above lifestyle modifications. Jody Bryan will continue to watch for signs of hypotension as Jody Bryan continues her lifestyle modifications. Jody Bryan agrees to follow up with our clinic in 2 weeks and we will recheck blood pressure at that time.  Depression Screen Jody Bryan had a moderately positive depression screening. Depression is commonly associated with obesity and often results in emotional eating behaviors. We will monitor this closely and work on CBT to help improve the non-hunger eating patterns. Referral to Psychology may be required if no improvement is seen as Jody Bryan continues in our clinic.  Obesity Jody Bryan is currently in the action stage of change and her goal is to continue with weight loss efforts. I recommend Jody Bryan begin the structured treatment plan as follows:  Jody Bryan has agreed to follow the Category 2 plan Jody Bryan has been instructed to eventually work up to a goal of 150 minutes of combined cardio and strengthening exercise per week for weight loss and overall health benefits. We discussed the following Behavioral Modification Strategies  today: increasing lean protein intake, decreasing simple carbohydrates, work on meal planning and easy cooking plans and dealing with family or coworker sabotage   Jody Bryan was informed of the importance of frequent follow up visits to maximize her success with intensive lifestyle modifications for her multiple health conditions. Jody Bryan was informed we would discuss her lab results at her next visit unless there is a critical issue that needs to be addressed sooner. Lagena agreed to keep her next visit at the agreed upon time to discuss these results.    OBESITY BEHAVIORAL INTERVENTION VISIT  Today's visit was # 1 out of 22.  Starting weight: 259 lbs Starting date: 03/17/17 Today's weight : 259 lbs  Today's date: 03/17/2017 Total lbs lost to date: 0 (Patients must lose 7 lbs in the first 6 months to continue with counseling)   ASK: We discussed the diagnosis of obesity with Lesle Chrisarvitta Huss today and Ogechi agreed to give us permission to discuss obesity behavioral modification therapy today.  ASSESS: [name] has the diagnosis of obesity and her BMI today is 43.1 [place] is in the action stage of change   ADVISE: [name] was educated on the multiple health risks of obesity as well as the benefit of weight loss to improve her health. Jody Bryan was advised of the need for long term treatment and the importance of lifestyle modifications.  AGREE: Multiple dietary modification options and treatment options were discussed and  Ikia agreed to the above obesity treatment plan.   I, Burt KnackSharon Martin, am acting as transcriptionist for  Quillian Quincearen Shaunte Weissinger, MD  I have reviewed the above documentation for accuracy and completeness, and I agree with the above. -Quillian Quincearen Ender Rorke, MD

## 2017-03-18 LAB — T4, FREE: FREE T4: 1.14 ng/dL (ref 0.82–1.77)

## 2017-03-18 LAB — TSH: TSH: 1.21 u[IU]/mL (ref 0.450–4.500)

## 2017-03-18 LAB — VITAMIN D 25 HYDROXY (VIT D DEFICIENCY, FRACTURES): VIT D 25 HYDROXY: 11.9 ng/mL — AB (ref 30.0–100.0)

## 2017-03-18 LAB — VITAMIN B12: Vitamin B-12: 482 pg/mL (ref 232–1245)

## 2017-03-18 LAB — CBC WITH DIFFERENTIAL
BASOS ABS: 0 10*3/uL (ref 0.0–0.2)
BASOS: 0 %
EOS (ABSOLUTE): 0.2 10*3/uL (ref 0.0–0.4)
EOS: 1 %
HEMATOCRIT: 43.1 % (ref 34.0–46.6)
HEMOGLOBIN: 14.4 g/dL (ref 11.1–15.9)
IMMATURE GRANS (ABS): 0 10*3/uL (ref 0.0–0.1)
Immature Granulocytes: 0 %
LYMPHS ABS: 3.5 10*3/uL — AB (ref 0.7–3.1)
LYMPHS: 30 %
MCH: 29.8 pg (ref 26.6–33.0)
MCHC: 33.4 g/dL (ref 31.5–35.7)
MCV: 89 fL (ref 79–97)
Monocytes Absolute: 0.7 10*3/uL (ref 0.1–0.9)
Monocytes: 6 %
NEUTROS ABS: 7.1 10*3/uL — AB (ref 1.4–7.0)
Neutrophils: 63 %
RBC: 4.83 x10E6/uL (ref 3.77–5.28)
RDW: 13.8 % (ref 12.3–15.4)
WBC: 11.5 10*3/uL — ABNORMAL HIGH (ref 3.4–10.8)

## 2017-03-18 LAB — COMPREHENSIVE METABOLIC PANEL
ALBUMIN: 4.6 g/dL (ref 3.5–5.5)
ALT: 18 IU/L (ref 0–32)
AST: 16 IU/L (ref 0–40)
Albumin/Globulin Ratio: 1.7 (ref 1.2–2.2)
Alkaline Phosphatase: 89 IU/L (ref 39–117)
BILIRUBIN TOTAL: 0.7 mg/dL (ref 0.0–1.2)
BUN / CREAT RATIO: 16 (ref 9–23)
BUN: 12 mg/dL (ref 6–20)
CHLORIDE: 106 mmol/L (ref 96–106)
CO2: 19 mmol/L — ABNORMAL LOW (ref 20–29)
Calcium: 9.5 mg/dL (ref 8.7–10.2)
Creatinine, Ser: 0.76 mg/dL (ref 0.57–1.00)
GFR calc Af Amer: 114 mL/min/{1.73_m2} (ref >59)
GFR calc non Af Amer: 99 mL/min/{1.73_m2} (ref >59)
GLOBULIN, TOTAL: 2.7 g/dL (ref 1.5–4.5)
GLUCOSE: 91 mg/dL (ref 65–99)
Potassium: 5 mmol/L (ref 3.5–5.2)
Sodium: 142 mmol/L (ref 134–144)
Total Protein: 7.3 g/dL (ref 6.0–8.5)

## 2017-03-18 LAB — INSULIN, RANDOM: INSULIN: 25.7 u[IU]/mL — AB (ref 2.6–24.9)

## 2017-03-18 LAB — LIPID PANEL WITH LDL/HDL RATIO
Cholesterol, Total: 156 mg/dL (ref 100–199)
HDL: 47 mg/dL (ref >39)
LDL Calculated: 94 mg/dL (ref 0–99)
LDL/HDL RATIO: 2 ratio (ref 0.0–3.2)
Triglycerides: 74 mg/dL (ref 0–149)
VLDL Cholesterol Cal: 15 mg/dL (ref 5–40)

## 2017-03-18 LAB — HEMOGLOBIN A1C
Est. average glucose Bld gHb Est-mCnc: 114 mg/dL
Hgb A1c MFr Bld: 5.6 % (ref 4.8–5.6)

## 2017-03-18 LAB — FOLATE: Folate: 17.6 ng/mL (ref >3.0)

## 2017-03-18 LAB — T3: T3 TOTAL: 149 ng/dL (ref 71–180)

## 2017-03-30 ENCOUNTER — Ambulatory Visit (INDEPENDENT_AMBULATORY_CARE_PROVIDER_SITE_OTHER): Payer: 59 | Admitting: Family Medicine

## 2017-04-01 ENCOUNTER — Encounter (INDEPENDENT_AMBULATORY_CARE_PROVIDER_SITE_OTHER): Payer: Self-pay

## 2017-04-01 ENCOUNTER — Ambulatory Visit (INDEPENDENT_AMBULATORY_CARE_PROVIDER_SITE_OTHER): Payer: 59 | Admitting: Family Medicine

## 2017-04-01 VITALS — BP 148/83 | HR 68 | Temp 99.0°F | Ht 65.0 in | Wt 249.0 lb

## 2017-04-01 DIAGNOSIS — E559 Vitamin D deficiency, unspecified: Secondary | ICD-10-CM | POA: Insufficient documentation

## 2017-04-01 DIAGNOSIS — Z9189 Other specified personal risk factors, not elsewhere classified: Secondary | ICD-10-CM

## 2017-04-01 DIAGNOSIS — Z6841 Body Mass Index (BMI) 40.0 and over, adult: Secondary | ICD-10-CM | POA: Diagnosis not present

## 2017-04-01 DIAGNOSIS — R7303 Prediabetes: Secondary | ICD-10-CM | POA: Insufficient documentation

## 2017-04-01 MED ORDER — VITAMIN D (ERGOCALCIFEROL) 1.25 MG (50000 UNIT) PO CAPS: 50000.0000 [IU] | ORAL_CAPSULE | ORAL | 0 refills | Status: DC

## 2017-04-01 NOTE — Progress Notes (Signed)
Office: [phone]  /  Fax: [phone]   HPI:   Chief Complaint: OBESITY Jody Bryan is here to discuss her progress with her obesity treatment plan. She is on the Category 2 plan and is following her eating plan approximately 80 % of the time. She states she is walking for 15 minutes 7 times per week. Jody Bryan liked the diversity and portion sizes of the category 2 plan. She is getting tired of 2 eggs at breakfast. Jody Bryan ate a good dinner at night and liked the meat and vegetable portion. Her weight is 249 lb (112.9 kg) today and has had a weight loss of 10 pounds over a period of 2 weeks since her last visit. She has lost 10 lbs since starting treatment with [name].  Vitamin D deficiency Jody Bryan has a new diagnosis of vitamin D deficiency. Her level is at 11.9 and she is not currently taking vit D. Jody Bryan denies nausea, vomiting or muscle weakness.   Ref. Range 03/17/2017 10:42  Vitamin D, 25-Hydroxy Latest Ref Range: 30.0 - 100.0 ng/mL 11.9 (L)   Pre-Diabetes [name] has a diagnosis of pre-diabetes based on her elevated Hgb A1c of 5.6 and insulin of 25.7 and was informed this puts her at greater risk of developing diabetes. She is not taking metformin currently and continues to work on diet and exercise to decrease risk of diabetes. She denies hyperphagia and notes occasional hypoglycemic events prior to category 2 plan.   At risk for diabetes Jody Bryan is at higher than average risk for developing diabetes due to her obesity and pre-diabetes. She currently denies polyuria or polydipsia.  ALLERGIES: No Known Allergies  MEDICATIONS: Current Outpatient Medications on File Prior to Visit  Medication Sig Dispense Refill  . carisoprodol (SOMA) 350 MG tablet Take 1 tablet (350 mg total) by mouth 4 (four) times daily as needed for muscle spasms. 30 tablet 0  . clotrimazole-betamethasone (LOTRISONE) cream Apply 1 application topically 2 (two) times daily. 30 g 0  . hydrochlorothiazide  (HYDRODIURIL) 25 MG tablet Take 1 tablet (25 mg total) by mouth daily. 90 tablet 3  . medroxyPROGESTERone (DEPO-PROVERA) 150 MG/ML injection Inject 150 mg into the muscle every 3 (three) months.    . meloxicam (MOBIC) 15 MG tablet TAKE 1 TABLET BY MOUTH EVERY DAY 30 tablet 0  . naproxen sodium (ALEVE) 220 MG tablet Take 220 mg by mouth 2 (two) times daily as needed.    . Polyvinyl Alcohol-Povidone (CLEAR EYES ALL SEASONS) 5-6 MG/ML SOLN Apply 1 drop to eye daily as needed.    . predniSONE (DELTASONE) 10 MG tablet TAKE 3 TABLETS PO QD FOR 3 DAYS THEN TAKE 2 TABLETS PO QD FOR 3 DAYS THEN TAKE 1 TABLET PO QD FOR 3 DAYS THEN TAKE 1/2 TAB PO QD FOR 3 DAYS 20 tablet 0  . traMADol (ULTRAM) 50 MG tablet Take 1 tablet (50 mg total) by mouth every 6 (six) hours as needed. 30 tablet 1   No current facility-administered medications on file prior to visit.     PAST MEDICAL HISTORY: Past Medical History:  Diagnosis Date  . Bilateral leg edema   . Chicken pox   . Depression   . Drug use   . Dyspnea   . GERD (gastroesophageal reflux disease)   . HTN (hypertension)   . Low back pain     PAST SURGICAL HISTORY: No past surgical history on file.  SOCIAL HISTORY: Social History   Tobacco Use  . Smoking status: Current Some Day  Smoker    Years: 2.00    Types: Cigarettes  . Smokeless tobacco: Never Used  Substance Use Topics  . Alcohol use: Yes    Alcohol/week: 0.0 oz    Comment: social  . Drug use: No    FAMILY HISTORY: Family History  Problem Relation Age of Onset  . Hypertension Mother   . Obesity Mother   . Hypertension Father   . Depression Father   . Sleep apnea Father   . Drug abuse Father   . Obesity Father   . Arthritis Paternal Grandmother   . Cancer Paternal Grandmother        breast  . Alcohol abuse Sister   . Stroke Sister   . Heart disease Maternal Grandmother   . Alcohol abuse Maternal Grandfather     ROS: Review of Systems  Constitutional: Positive for weight  loss.  Gastrointestinal: Negative for nausea and vomiting.  Genitourinary: Negative for frequency.  Musculoskeletal:       Negative muscle weakness  Endo/Heme/Allergies: Negative for polydipsia.       Negative hyperphagia Negative hypoglycemia    PHYSICAL EXAM: Blood pressure (!) 148/83, pulse 68, temperature 99 F (37.2 C), temperature source Oral, height 5\' 5"  (1.651 m), weight 249 lb (112.9 kg), SpO2 98 %. Body mass index is 41.44 kg/m. Physical Exam  Constitutional: She is oriented to person, place, and time. She appears well-developed and well-nourished.  Cardiovascular: Normal rate.  Pulmonary/Chest: Effort normal.  Musculoskeletal: Normal range of motion.  Neurological: She is oriented to person, place, and time.  Skin: Skin is warm and dry.  Psychiatric: She has a normal mood and affect. Her behavior is normal.  Vitals reviewed.   RECENT LABS AND TESTS: BMET    Component Value Date/Time   NA 142 03/17/2017 1042   K 5.0 03/17/2017 1042   CL 106 03/17/2017 1042   CO2 19 (L) 03/17/2017 1042   GLUCOSE 91 03/17/2017 1042   GLUCOSE 81 10/27/2016 1804   BUN 12 03/17/2017 1042   CREATININE 0.76 03/17/2017 1042   CREATININE 0.82 06/08/2014 1627   CALCIUM 9.5 03/17/2017 1042   GFRNONAA 99 03/17/2017 1042   GFRAA 114 03/17/2017 1042   Lab Results  Component Value Date   HGBA1C 5.6 03/17/2017   HGBA1C 5.7 06/11/2015   Lab Results  Component Value Date   INSULIN 25.7 (H) 03/17/2017   CBC    Component Value Date/Time   WBC 11.5 (H) 03/17/2017 1042   WBC 6.9 06/11/2015 1024   RBC 4.83 03/17/2017 1042   RBC 4.74 06/11/2015 1024   HGB 14.4 03/17/2017 1042   HCT 43.1 03/17/2017 1042   PLT 289.0 06/11/2015 1024   MCV 89 03/17/2017 1042   MCH 29.8 03/17/2017 1042   MCH 30.1 06/08/2014 1627   MCHC 33.4 03/17/2017 1042   MCHC 33.5 06/11/2015 1024   RDW 13.8 03/17/2017 1042   LYMPHSABS 3.5 (H) 03/17/2017 1042   MONOABS 0.6 06/11/2015 1024   EOSABS 0.2 03/17/2017  1042   BASOSABS 0.0 03/17/2017 1042   Iron/TIBC/Ferritin/ %Sat No results found for: IRON, TIBC, FERRITIN, IRONPCTSAT Lipid Panel     Component Value Date/Time   CHOL 156 03/17/2017 1042   TRIG 74 03/17/2017 1042   HDL 47 03/17/2017 1042   CHOLHDL 4 06/11/2015 1024   VLDL 10.4 06/11/2015 1024   LDLCALC 94 03/17/2017 1042   Hepatic Function Panel     Component Value Date/Time   PROT 7.3 03/17/2017 1042   ALBUMIN  4.6 03/17/2017 1042   AST 16 03/17/2017 1042   ALT 18 03/17/2017 1042   ALKPHOS 89 03/17/2017 1042   BILITOT 0.7 03/17/2017 1042   BILIDIR 0.2 06/11/2015 1024   IBILI 0.6 06/08/2014 1627      Component Value Date/Time   TSH 1.210 03/17/2017 1042   TSH 1.28 06/11/2015 1024   TSH 1.680 06/08/2014 1627     Ref. Range 03/17/2017 10:42  Vitamin D, 25-Hydroxy Latest Ref Range: 30.0 - 100.0 ng/mL 11.9 (L)   ASSESSMENT AND PLAN: Vitamin D deficiency - Plan: Vitamin D, Ergocalciferol, (DRISDOL) 50000 units CAPS capsule  Prediabetes  At risk for diabetes mellitus  Class 3 severe obesity with serious comorbidity and body mass index (BMI) of 40.0 to 44.9 in adult, unspecified obesity type (HCC)  PLAN:  Vitamin D Deficiency Alayha was informed that low vitamin D levels contributes to fatigue and are associated with obesity, breast, and colon cancer. She agrees to start to take prescription Vit D @50 ,000 IU every week #4 with no refills. We will recheck labs in 3 months and will follow up for routine testing of vitamin D, at least 2-3 times per year. She was informed of the risk of over-replacement of vitamin D and agrees to not increase her dose unless she discusses this with [name] first. Grenda agrees to follow up with our clinic in 2 weeks.  Pre-Diabetes Felix will continue with the category 2 plan and will continue to work on weight loss, exercise, and decreasing simple carbohydrates in her diet to help decrease the risk of diabetes. She was informed that eating  too many simple carbohydrates or too many calories at one sitting increases the likelihood of GI side effects. We will recheck labs in 3 months and Adin agreed to follow up with [name] as directed to monitor her progress.  Diabetes risk counseling Zoei was given extended (15 minutes) diabetes prevention counseling today. She is 37 y.o. female and has risk factors for diabetes including obesity and pre-diabetes. We discussed intensive lifestyle modifications today with an emphasis on weight loss as well as increasing exercise and decreasing simple carbohydrates in her diet.  Obesity Nickayla is currently in the action stage of change. As such, her goal is to continue with weight loss efforts She has agreed to follow the Category 2 plan +alt breakfast Jermiyah has been instructed to work up to a goal of 150 minutes of combined cardio and strengthening exercise per week for weight loss and overall health benefits. We discussed the following Behavioral Modification Strategies today: increase H2O intake, increasing lean protein intake and decreasing simple carbohydrates   Sheketa has agreed to follow up with our clinic in 2 weeks. She was informed of the importance of frequent follow up visits to maximize her success with intensive lifestyle modifications for her multiple health conditions.   OBESITY BEHAVIORAL INTERVENTION VISIT  Today's visit was # 2 out of 22.  Starting weight: 259 lbs Starting date: 03/17/17 Today's weight : 249 lbs Today's date: 04/01/2017 Total lbs lost to date: 10 (Patients must lose 7 lbs in the first 6 months to continue with counseling)   ASK: We discussed the diagnosis of obesity with Lesle Chris today and Kyera agreed to give [name] permission to discuss obesity behavioral modification therapy today.  ASSESS: Jodelle has the diagnosis of obesity and her BMI today is 41.44 Arsenia is in the action stage of change   ADVISE: Jaylina was educated on  the multiple health risks of obesity  as well as the benefit of weight loss to improve her health. She was advised of the need for long term treatment and the importance of lifestyle modifications.  AGREE: Multiple dietary modification options and treatment options were discussed and  Angeletta agreed to the above obesity treatment plan.  I, [place] CraneJoanne Murray, am acting as transcriptionist for Quillian Quincearen Salih Williamson, MD  I have reviewed the above documentation for accuracy and completeness, and I agree with the above. -Quillian Quincearen Shannen [place], MD

## 2017-04-05 ENCOUNTER — Encounter (INDEPENDENT_AMBULATORY_CARE_PROVIDER_SITE_OTHER): Payer: Self-pay | Admitting: Family Medicine

## 2017-04-15 ENCOUNTER — Ambulatory Visit (INDEPENDENT_AMBULATORY_CARE_PROVIDER_SITE_OTHER): Payer: 59 | Admitting: Family Medicine

## 2017-04-15 VITALS — BP 137/82 | Ht 65.0 in | Wt 243.0 lb

## 2017-04-15 DIAGNOSIS — R7303 Prediabetes: Secondary | ICD-10-CM | POA: Diagnosis not present

## 2017-04-15 DIAGNOSIS — E559 Vitamin D deficiency, unspecified: Secondary | ICD-10-CM | POA: Diagnosis not present

## 2017-04-15 DIAGNOSIS — Z9189 Other specified personal risk factors, not elsewhere classified: Secondary | ICD-10-CM

## 2017-04-15 DIAGNOSIS — Z6841 Body Mass Index (BMI) 40.0 and over, adult: Secondary | ICD-10-CM | POA: Diagnosis not present

## 2017-04-15 MED ORDER — VITAMIN D (ERGOCALCIFEROL) 1.25 MG (50000 UNIT) PO CAPS: 50000.0000 [IU] | ORAL_CAPSULE | ORAL | 0 refills | Status: DC

## 2017-04-15 NOTE — Progress Notes (Signed)
Office: [phone]  /  Fax: [phone]   HPI:   Chief Complaint: OBESITY Jody Bryan is here to discuss her progress with her obesity treatment plan. She is on the  follow the Category 2 plan and is following her eating plan approximately 75 % of the time. She states she is walking for 15 minutes 7 days per week.  Jody Bryan has 2 "slip-ups" over the past 2 weeks. Her parents made lots of home cooking of starchy and greasy food, and she indulged in alcohol on Super Bowl Sunday  Her weight is 243 lb (110.2 kg) today and has had a weight loss of 6 pounds over a period of 2 weeks since her last visit. She has lost 16 lbs since starting treatment with [name].  Vitamin D deficiency Jody Bryan has a diagnosis of vitamin D deficiency. She is currently taking vit D and and admits to fatigue but denies nausea, vomiting or muscle weakness.   Ref. Range 03/17/2017 10:42  Vitamin D, 25-Hydroxy Latest Ref Range: 30.0 - 100.0 ng/mL 11.9 (L)   Pre-Diabetes Jody Bryan has a diagnosis of prediabetes based on her elevated HgA1c and Insulin of 25.7. She was informed this puts her at greater risk of developing diabetes. She is not taking metformin currently and continues to work on diet and exercise to decrease risk of diabetes. She denies nausea or hypoglycemia, hyperphagia, polydipsia, and polyuria.   At risk for osteopenia and osteoporosis Jody Bryan is at higher risk of osteopenia and osteoporosis due to vitamin D deficiency.    ALLERGIES: No Known Allergies  MEDICATIONS: Current Outpatient Medications on File Prior to Visit  Medication Sig Dispense Refill  . carisoprodol (SOMA) 350 MG tablet Take 1 tablet (350 mg total) by mouth 4 (four) times daily as needed for muscle spasms. 30 tablet 0  . clotrimazole-betamethasone (LOTRISONE) cream Apply 1 application topically 2 (two) times daily. 30 g 0  . hydrochlorothiazide (HYDRODIURIL) 25 MG tablet Take 1 tablet (25 mg total) by mouth daily. 90 tablet 3  .  medroxyPROGESTERone (DEPO-PROVERA) 150 MG/ML injection Inject 150 mg into the muscle every 3 (three) months.    . meloxicam (MOBIC) 15 MG tablet TAKE 1 TABLET BY MOUTH EVERY DAY 30 tablet 0  . naproxen sodium (ALEVE) 220 MG tablet Take 220 mg by mouth 2 (two) times daily as needed.    . Polyvinyl Alcohol-Povidone (CLEAR EYES ALL SEASONS) 5-6 MG/ML SOLN Apply 1 drop to eye daily as needed.    . predniSONE (DELTASONE) 10 MG tablet TAKE 3 TABLETS PO QD FOR 3 DAYS THEN TAKE 2 TABLETS PO QD FOR 3 DAYS THEN TAKE 1 TABLET PO QD FOR 3 DAYS THEN TAKE 1/2 TAB PO QD FOR 3 DAYS 20 tablet 0  . traMADol (ULTRAM) 50 MG tablet Take 1 tablet (50 mg total) by mouth every 6 (six) hours as needed. 30 tablet 1  . Vitamin D, Ergocalciferol, (DRISDOL) 50000 units CAPS capsule Take 1 capsule (50,000 Units total) by mouth every 7 (seven) days. 4 capsule 0   No current facility-administered medications on file prior to visit.     PAST MEDICAL HISTORY: Past Medical History:  Diagnosis Date  . Bilateral leg edema   . Chicken pox   . Depression   . Drug use   . Dyspnea   . GERD (gastroesophageal reflux disease)   . HTN (hypertension)   . Low back pain     PAST SURGICAL HISTORY: No past surgical history on file.  SOCIAL HISTORY: Social History  Tobacco Use  . Smoking status: Current Some Day Smoker    Years: 2.00    Types: Cigarettes  . Smokeless tobacco: Never Used  Substance Use Topics  . Alcohol use: Yes    Alcohol/week: 0.0 oz    Comment: social  . Drug use: No    FAMILY HISTORY: Family History  Problem Relation Age of Onset  . Hypertension Mother   . Obesity Mother   . Hypertension Father   . Depression Father   . Sleep apnea Father   . Drug abuse Father   . Obesity Father   . Arthritis Paternal Grandmother   . Cancer Paternal Grandmother        breast  . Alcohol abuse Sister   . Stroke Sister   . Heart disease Maternal Grandmother   . Alcohol abuse Maternal Grandfather      ROS: Review of Systems  Constitutional: Positive for malaise/fatigue and weight loss.  Gastrointestinal: Negative for nausea and vomiting.  Musculoskeletal:       Negative for muscle weakness  Endo/Heme/Allergies: Negative for polydipsia.       Negative for hypoglycemia  Negative for hyperphagia  Negative for polyuria     PHYSICAL EXAM: Blood pressure 137/82, height 5\' 5"  (1.651 m), weight 243 lb (110.2 kg). Body mass index is 40.44 kg/m. Physical Exam  Constitutional: She is oriented to person, place, and time. She appears well-developed and well-nourished.  HENT:  Head: Normocephalic.  Eyes: EOM are normal.  Neck: Normal range of motion.  Cardiovascular: Normal rate.  Pulmonary/Chest: Effort normal.  Musculoskeletal: Normal range of motion.  Neurological: She is alert and oriented to person, place, and time.  Skin: Skin is warm and dry.  Psychiatric: She has a normal mood and affect. Her behavior is normal.  Vitals reviewed.   RECENT LABS AND TESTS: BMET    Component Value Date/Time   NA 142 03/17/2017 1042   K 5.0 03/17/2017 1042   CL 106 03/17/2017 1042   CO2 19 (L) 03/17/2017 1042   GLUCOSE 91 03/17/2017 1042   GLUCOSE 81 10/27/2016 1804   BUN 12 03/17/2017 1042   CREATININE 0.76 03/17/2017 1042   CREATININE 0.82 06/08/2014 1627   CALCIUM 9.5 03/17/2017 1042   GFRNONAA 99 03/17/2017 1042   GFRAA 114 03/17/2017 1042   Lab Results  Component Value Date   HGBA1C 5.6 03/17/2017   HGBA1C 5.7 06/11/2015   Lab Results  Component Value Date   INSULIN 25.7 (H) 03/17/2017   CBC    Component Value Date/Time   WBC 11.5 (H) 03/17/2017 1042   WBC 6.9 06/11/2015 1024   RBC 4.83 03/17/2017 1042   RBC 4.74 06/11/2015 1024   HGB 14.4 03/17/2017 1042   HCT 43.1 03/17/2017 1042   PLT 289.0 06/11/2015 1024   MCV 89 03/17/2017 1042   MCH 29.8 03/17/2017 1042   MCH 30.1 06/08/2014 1627   MCHC 33.4 03/17/2017 1042   MCHC 33.5 06/11/2015 1024   RDW 13.8  03/17/2017 1042   LYMPHSABS 3.5 (H) 03/17/2017 1042   MONOABS 0.6 06/11/2015 1024   EOSABS 0.2 03/17/2017 1042   BASOSABS 0.0 03/17/2017 1042   Iron/TIBC/Ferritin/ %Sat No results found for: IRON, TIBC, FERRITIN, IRONPCTSAT Lipid Panel     Component Value Date/Time   CHOL 156 03/17/2017 1042   TRIG 74 03/17/2017 1042   HDL 47 03/17/2017 1042   CHOLHDL 4 06/11/2015 1024   VLDL 10.4 06/11/2015 1024   LDLCALC 94 03/17/2017 1042  Hepatic Function Panel     Component Value Date/Time   PROT 7.3 03/17/2017 1042   ALBUMIN 4.6 03/17/2017 1042   AST 16 03/17/2017 1042   ALT 18 03/17/2017 1042   ALKPHOS 89 03/17/2017 1042   BILITOT 0.7 03/17/2017 1042   BILIDIR 0.2 06/11/2015 1024   IBILI 0.6 06/08/2014 1627      Component Value Date/Time   TSH 1.210 03/17/2017 1042   TSH 1.28 06/11/2015 1024   TSH 1.680 06/08/2014 1627     Ref. Range 03/17/2017 10:42  Vitamin D, 25-Hydroxy Latest Ref Range: 30.0 - 100.0 ng/mL 11.9 (L)    ASSESSMENT AND PLAN: Vitamin D deficiency - Plan: Vitamin D, Ergocalciferol, (DRISDOL) 50000 units CAPS capsule  Prediabetes  At risk for osteoporosis  Class 3 severe obesity with serious comorbidity and body mass index (BMI) of 40.0 to 44.9 in adult, unspecified obesity type (HCC)  PLAN: Vitamin D Deficiency Jody Bryan was informed that low vitamin D levels contributes to fatigue and are associated with obesity, breast, and colon cancer. She agrees to continue to take prescription Vit D @50 ,000 IU every week #4 with no refills and will follow up for routine testing of vitamin D, at least 2-3 times per year. She was informed of the risk of over-replacement of vitamin D and agrees to not increase her dose unless she discusses this with [name] first. She agrees to follow up with our clinic in 2 weeks.   Pre-Diabetes Jody Bryan will continue to work on weight loss, exercise, and decreasing simple carbohydrates in her diet to help decrease the risk of diabetes. We  dicussed metformin including benefits and risks. She was informed that eating too many simple carbohydrates or too many calories at one sitting increases the likelihood of GI side effects. Metformin prescription was not written today. She will continue Cat 2 meal plan. Jody Bryan agreed to follow up with [name] as directed to monitor her progress.  At risk for osteopenia and osteoporosis Jody Bryan is at risk for osteopenia and osteoporsis due to her vitamin D deficiency. She was encouraged to take her vitamin D and follow her higher calcium diet and increase strengthening exercise to help strengthen her bones and decrease her risk of osteopenia and osteoporosis.  Obesity Jody Bryan is currently in the action stage of change. As such, her goal is to continue with weight loss efforts She has agreed to follow the Category 2 plan Jody Bryan has been instructed to work up to a goal of 150 minutes of combined cardio and strengthening exercise per week for weight loss and overall health benefits. We discussed the following Behavioral Modification Strategies today: increasing lean protein intake   Jody Bryan has agreed to follow up with our clinic in 2 weeks. She was informed of the importance of frequent follow up visits to maximize her success with intensive lifestyle modifications for her multiple health conditions.   OBESITY BEHAVIORAL INTERVENTION VISIT  Today's visit was # 3 out of 22.  Starting weight: 259 lb Starting date: 03/17/17 Today's weight : 243 lb Today's date: 04/15/2017 Total lbs lost to date: 16 (Patients must lose 7 lbs in the first 6 months to continue with counseling)   ASK: We discussed the diagnosis of obesity with Jody Bryan today and Jody Bryan agreed to give [name] permission to discuss obesity behavioral modification therapy today.  ASSESS: Jody Bryan has the diagnosis of obesity and her BMI today is 40.44 Jody Bryan is in the action stage of change   ADVISE: Jody Bryan was  educated on  the multiple health risks of obesity as well as the benefit of weight loss to improve her health. She was advised of the need for long term treatment and the importance of lifestyle modifications.  AGREE: Multiple dietary modification options and treatment options were discussed and  Jody Bryan agreed to the above obesity treatment plan.  I, Jody Bryan, am acting as transcriptionist for Debbra RidingAlexandria Kadolph, MD   I have reviewed the above documentation for accuracy and completeness, and I agree with the above. - Debbra RidingAlexandria Kadolph, MD

## 2017-04-29 ENCOUNTER — Ambulatory Visit (INDEPENDENT_AMBULATORY_CARE_PROVIDER_SITE_OTHER): Payer: 59 | Admitting: Family Medicine

## 2017-04-29 ENCOUNTER — Other Ambulatory Visit (INDEPENDENT_AMBULATORY_CARE_PROVIDER_SITE_OTHER): Payer: Self-pay | Admitting: Family Medicine

## 2017-04-29 VITALS — BP 153/83 | HR 82 | Temp 98.2°F | Ht 65.0 in | Wt 238.0 lb

## 2017-04-29 DIAGNOSIS — Z6839 Body mass index (BMI) 39.0-39.9, adult: Secondary | ICD-10-CM | POA: Diagnosis not present

## 2017-04-29 DIAGNOSIS — R7303 Prediabetes: Secondary | ICD-10-CM | POA: Diagnosis not present

## 2017-04-29 DIAGNOSIS — E559 Vitamin D deficiency, unspecified: Secondary | ICD-10-CM | POA: Diagnosis not present

## 2017-04-29 NOTE — Progress Notes (Signed)
Office: [phone]  /  Fax: [phone]   HPI:   Chief Complaint: OBESITY Jody Bryan is here to discuss her progress with her obesity treatment plan. She is on the Category 2 plan +alt breakfast and is following her eating plan approximately 85 % of the time. She states she is exercising 0 minutes 0 times per week. [name] went out to eat and enjoyed food. She is limiting her food when she eats out. [name] really wants to go out to a [employer]. Her weight is 238 lb (108 kg) today and has had a weight loss of 5 pounds over a period of 2 weeks since her last visit. She has lost 21 lbs since starting treatment with Jody Bryan.  Vitamin D deficiency Jody Bryan has a diagnosis of vitamin D deficiency. She is currently taking vit D and denies nausea, vomiting or muscle weakness.   Ref. Range 03/17/2017 10:42  Vitamin D, 25-Hydroxy Latest Ref Range: 30.0 - 100.0 ng/mL 11.9 (L)   Pre-Diabetes Jody Bryan has a diagnosis of pre-diabetes based on her elevated Hgb A1c and was informed this puts her at greater risk of developing diabetes. She is not taking metformin currently and continues to work on diet and exercise to decrease risk of diabetes. She denies hyperphagia, polyuria or polydipsia.  ALLERGIES: No Known Allergies  MEDICATIONS: Current Outpatient Medications on File Prior to Visit  Medication Sig Dispense Refill  . carisoprodol (SOMA) 350 MG tablet Take 1 tablet (350 mg total) by mouth 4 (four) times daily as needed for muscle spasms. 30 tablet 0  . clotrimazole-betamethasone (LOTRISONE) cream Apply 1 application topically 2 (two) times daily. 30 g 0  . hydrochlorothiazide (HYDRODIURIL) 25 MG tablet Take 1 tablet (25 mg total) by mouth daily. 90 tablet 3  . medroxyPROGESTERone (DEPO-PROVERA) 150 MG/ML injection Inject 150 mg into the muscle every 3 (three) months.    . meloxicam (MOBIC) 15 MG tablet TAKE 1 TABLET BY MOUTH EVERY DAY 30 tablet 0  . naproxen sodium (ALEVE) 220 MG tablet  Take 220 mg by mouth 2 (two) times daily as needed.    . Polyvinyl Alcohol-Povidone (CLEAR EYES ALL SEASONS) 5-6 MG/ML SOLN Apply 1 drop to eye daily as needed.    . predniSONE (DELTASONE) 10 MG tablet TAKE 3 TABLETS PO QD FOR 3 DAYS THEN TAKE 2 TABLETS PO QD FOR 3 DAYS THEN TAKE 1 TABLET PO QD FOR 3 DAYS THEN TAKE 1/2 TAB PO QD FOR 3 DAYS 20 tablet 0  . traMADol (ULTRAM) 50 MG tablet Take 1 tablet (50 mg total) by mouth every 6 (six) hours as needed. 30 tablet 1  . Vitamin D, Ergocalciferol, (DRISDOL) 50000 units CAPS capsule Take 1 capsule (50,000 Units total) by mouth every 7 (seven) days. 4 capsule 0   No current facility-administered medications on file prior to visit.     PAST MEDICAL HISTORY: Past Medical History:  Diagnosis Date  . Bilateral leg edema   . Chicken pox   . Depression   . Drug use   . Dyspnea   . GERD (gastroesophageal reflux disease)   . HTN (hypertension)   . Low back pain     PAST SURGICAL HISTORY: No past surgical history on file.  SOCIAL HISTORY: Social History   Tobacco Use  . Smoking status: Current Some Day Smoker    Years: 2.00    Types: Cigarettes  . Smokeless tobacco: Never Used  Substance Use Topics  . Alcohol use: Yes    Alcohol/week: 0.0  oz    Comment: social  . Drug use: No    FAMILY HISTORY: Family History  Problem Relation Age of Onset  . Hypertension Mother   . Obesity Mother   . Hypertension Father   . Depression Father   . Sleep apnea Father   . Drug abuse Father   . Obesity Father   . Arthritis Paternal Grandmother   . Cancer Paternal Grandmother        breast  . Alcohol abuse Sister   . Stroke Sister   . Heart disease Maternal Grandmother   . Alcohol abuse Maternal Grandfather     ROS: Review of Systems  Constitutional: Positive for weight loss.  Gastrointestinal: Negative for nausea and vomiting.  Genitourinary: Negative for frequency.  Musculoskeletal:       Negative muscle weakness  Endo/Heme/Allergies:  Negative for polydipsia.       Negative for hyperphagia    PHYSICAL EXAM: Blood pressure (!) 153/83, pulse 82, temperature 98.2 F (36.8 C), temperature source Oral, height 5\' 5"  (1.651 m), weight 238 lb (108 kg), SpO2 97 %. Body mass index is 39.61 kg/m. Physical Exam  Constitutional: She is oriented to person, place, and time. She appears well-developed and well-nourished.  Cardiovascular: Normal rate.  Pulmonary/Chest: Effort normal.  Musculoskeletal: Normal range of motion.  Neurological: She is oriented to person, place, and time.  Skin: Skin is warm and dry.  Psychiatric: She has a normal mood and affect. Her behavior is normal.  Vitals reviewed.   RECENT LABS AND TESTS: BMET    Component Value Date/Time   NA 142 03/17/2017 1042   K 5.0 03/17/2017 1042   CL 106 03/17/2017 1042   CO2 19 (L) 03/17/2017 1042   GLUCOSE 91 03/17/2017 1042   GLUCOSE 81 10/27/2016 1804   BUN 12 03/17/2017 1042   CREATININE 0.76 03/17/2017 1042   CREATININE 0.82 06/08/2014 1627   CALCIUM 9.5 03/17/2017 1042   GFRNONAA 99 03/17/2017 1042   GFRAA 114 03/17/2017 1042   Lab Results  Component Value Date   HGBA1C 5.6 03/17/2017   HGBA1C 5.7 06/11/2015   Lab Results  Component Value Date   INSULIN 25.7 (H) 03/17/2017   CBC    Component Value Date/Time   WBC 11.5 (H) 03/17/2017 1042   WBC 6.9 06/11/2015 1024   RBC 4.83 03/17/2017 1042   RBC 4.74 06/11/2015 1024   HGB 14.4 03/17/2017 1042   HCT 43.1 03/17/2017 1042   PLT 289.0 06/11/2015 1024   MCV 89 03/17/2017 1042   MCH 29.8 03/17/2017 1042   MCH 30.1 06/08/2014 1627   MCHC 33.4 03/17/2017 1042   MCHC 33.5 06/11/2015 1024   RDW 13.8 03/17/2017 1042   LYMPHSABS 3.5 (H) 03/17/2017 1042   MONOABS 0.6 06/11/2015 1024   EOSABS 0.2 03/17/2017 1042   BASOSABS 0.0 03/17/2017 1042   Iron/TIBC/Ferritin/ %Sat No results found for: IRON, TIBC, FERRITIN, IRONPCTSAT Lipid Panel     Component Value Date/Time   CHOL 156 03/17/2017  1042   TRIG 74 03/17/2017 1042   HDL 47 03/17/2017 1042   CHOLHDL 4 06/11/2015 1024   VLDL 10.4 06/11/2015 1024   LDLCALC 94 03/17/2017 1042   Hepatic Function Panel     Component Value Date/Time   PROT 7.3 03/17/2017 1042   ALBUMIN 4.6 03/17/2017 1042   AST 16 03/17/2017 1042   ALT 18 03/17/2017 1042   ALKPHOS 89 03/17/2017 1042   BILITOT 0.7 03/17/2017 1042   BILIDIR 0.2 06/11/2015  1024   IBILI 0.6 06/08/2014 1627      Component Value Date/Time   TSH 1.210 03/17/2017 1042   TSH 1.28 06/11/2015 1024   TSH 1.680 06/08/2014 1627     Ref. Range 03/17/2017 10:42  Vitamin D, 25-Hydroxy Latest Ref Range: 30.0 - 100.0 ng/mL 11.9 (L)   ASSESSMENT AND PLAN: Vitamin D deficiency  Prediabetes  Class 2 severe obesity with serious comorbidity and body mass index (BMI) of 39.0 to 39.9 in adult, unspecified obesity type (HCC)   PLAN:  Vitamin D Deficiency Linell was informed that low vitamin D levels contributes to fatigue and are associated with obesity, breast, and colon cancer. She agrees to continue to take prescription Vit D @50 ,000 IU every week and will follow up for routine testing of vitamin D, at least 2-3 times per year. She was informed of the risk of over-replacement of vitamin D and agrees to not increase her dose unless she discusses this with Jody Bryan first.  Pre-Diabetes Talibah will continue on the category 2 plan. She will work on weight loss, exercise, and decreasing simple carbohydrates in her diet to help decrease the risk of diabetes. She was informed that eating too many simple carbohydrates or too many calories at one sitting increases the likelihood of GI side effects. We will recheck Hgb A1c and insulin mid April and Rida agreed to follow up with Jody Bryan as directed to monitor her progress.  We spent > than 50% of the 15 minute visit on the counseling as documented in the note.  Obesity Lucille is currently in the action stage of change. As such, her goal is  to continue with weight loss efforts She has agreed to follow the Category 2 plan Stefana has been instructed to work up to a goal of 150 minutes of combined cardio and strengthening exercise per week or start 3 to 4, 10 minute walks or elliptical in the next 2 weeks for weight loss and overall health benefits. We discussed the following Behavioral Modification Strategies today: increasing lean protein intake, increase H2O intake, better snacking choices and planning for success  Eupha has agreed to follow up with our clinic in 2 weeks. She was informed of the importance of frequent follow up visits to maximize her success with intensive lifestyle modifications for her multiple health conditions.   OBESITY BEHAVIORAL INTERVENTION VISIT  Today's visit was # 4 out of 22.  Starting weight: 259 lbs Starting date: 03/17/17 Today's weight : 238 lbs Today's date: 04/29/2017 Total lbs lost to date: 21 (Patients must lose 7 lbs in the first 6 months to continue with counseling)   ASK: We discussed the diagnosis of obesity with Lesle Chris today and Hermie agreed to give Jody Bryan permission to discuss obesity behavioral modification therapy today.  ASSESS: Inna has the diagnosis of obesity and her BMI today is 39.61 Kami is in the action stage of change   ADVISE: Ailani was educated on the multiple health risks of obesity as well as the benefit of weight loss to improve her health. She was advised of the need for long term treatment and the importance of lifestyle modifications.  AGREE: Multiple dietary modification options and treatment options were discussed and  Dlisa agreed to the above obesity treatment plan.  I, [place] Crane, am acting as transcriptionist for Filbert Schilder, MD  I have reviewed the above documentation for accuracy and completeness, and I agree with the above. - Debbra Riding, MD

## 2017-05-05 ENCOUNTER — Ambulatory Visit: Payer: 59

## 2017-05-13 ENCOUNTER — Ambulatory Visit (INDEPENDENT_AMBULATORY_CARE_PROVIDER_SITE_OTHER): Payer: 59 | Admitting: Family Medicine

## 2017-05-13 VITALS — BP 126/84 | HR 83 | Temp 98.7°F | Ht 65.0 in | Wt 236.0 lb

## 2017-05-13 DIAGNOSIS — R7303 Prediabetes: Secondary | ICD-10-CM

## 2017-05-13 DIAGNOSIS — E559 Vitamin D deficiency, unspecified: Secondary | ICD-10-CM

## 2017-05-13 DIAGNOSIS — Z6839 Body mass index (BMI) 39.0-39.9, adult: Secondary | ICD-10-CM | POA: Diagnosis not present

## 2017-05-13 NOTE — Progress Notes (Signed)
Office: [phone]  /  Fax: [phone]   HPI:   Chief Complaint: OBESITY Jody Bryan is here to discuss her progress with her obesity treatment plan. She is on the Category 2 plan and is following her eating plan approximately 79 % of the time. She states she is walking 10 minutes 3 times per week. Jody Bryan has custody stressors today and in the past two weeks. She is still enjoying the category 2 plan. Her weight is 236 lb (107 kg) today and has had a weight loss of 2 pounds over a period of 2 weeks since her last visit. She has lost 23 lbs since starting treatment with [name].  Vitamin D deficiency Jody Bryan has a diagnosis of vitamin D deficiency. She is currently taking vit D and fatigue is improving. Jody Bryan denies nausea, vomiting or muscle weakness.   Ref. Range 03/17/2017 10:42  Vitamin D, 25-Hydroxy Latest Ref Range: 30.0 - 100.0 ng/mL 11.9 (L)   Pre-Diabetes Jody Bryan has a diagnosis of pre-diabetes based on her elevated Hgb A1c and was informed this puts her at greater risk of developing diabetes. Jody Bryan finds sweets to be too sweet. She is not taking metformin currently and continues to work on diet and exercise to decrease risk of diabetes. She denies nausea or hypoglycemia.  ALLERGIES: No Known Allergies  MEDICATIONS: Current Outpatient Medications on File Prior to Visit  Medication Sig Dispense Refill  . carisoprodol (SOMA) 350 MG tablet Take 1 tablet (350 mg total) by mouth 4 (four) times daily as needed for muscle spasms. 30 tablet 0  . clotrimazole-betamethasone (LOTRISONE) cream Apply 1 application topically 2 (two) times daily. 30 g 0  . meloxicam (MOBIC) 15 MG tablet TAKE 1 TABLET BY MOUTH EVERY DAY 30 tablet 0  . naproxen sodium (ALEVE) 220 MG tablet Take 220 mg by mouth 2 (two) times daily as needed.    . Polyvinyl Alcohol-Povidone (CLEAR EYES ALL SEASONS) 5-6 MG/ML SOLN Apply 1 drop to eye daily as needed.    . traMADol (ULTRAM) 50 MG tablet Take 1 tablet (50 mg  total) by mouth every 6 (six) hours as needed. 30 tablet 1  . Vitamin D, Ergocalciferol, (DRISDOL) 50000 units CAPS capsule Take 1 capsule (50,000 Units total) by mouth every 7 (seven) days. 4 capsule 0   No current facility-administered medications on file prior to visit.     PAST MEDICAL HISTORY: Past Medical History:  Diagnosis Date  . Bilateral leg edema   . Chicken pox   . Depression   . Drug use   . Dyspnea   . GERD (gastroesophageal reflux disease)   . HTN (hypertension)   . Low back pain     PAST SURGICAL HISTORY: No past surgical history on file.  SOCIAL HISTORY: Social History   Tobacco Use  . Smoking status: Current Some Day Smoker    Years: 2.00    Types: Cigarettes  . Smokeless tobacco: Never Used  Substance Use Topics  . Alcohol use: Yes    Alcohol/week: 0.0 oz    Comment: social  . Drug use: No    FAMILY HISTORY: Family History  Problem Relation Age of Onset  . Hypertension Mother   . Obesity Mother   . Hypertension Father   . Depression Father   . Sleep apnea Father   . Drug abuse Father   . Obesity Father   . Arthritis Paternal Grandmother   . Cancer Paternal Grandmother        breast  . Alcohol  abuse Sister   . Stroke Sister   . Heart disease Maternal Grandmother   . Alcohol abuse Maternal Grandfather     ROS: Review of Systems  Constitutional: Positive for malaise/fatigue and weight loss.  Gastrointestinal: Negative for nausea and vomiting.  Musculoskeletal:       Negative for muscle weakness  Endo/Heme/Allergies:       Negative for hypoglycemia    PHYSICAL EXAM: Blood pressure 126/84, pulse 83, temperature 98.7 F (37.1 C), temperature source Oral, height 5\' 5"  (1.651 m), weight 236 lb (107 kg), SpO2 96 %. Body mass index is 39.27 kg/m. Physical Exam  Constitutional: She is oriented to person, place, and time. She appears well-developed and well-nourished.  Cardiovascular: Normal rate.  Pulmonary/Chest: Effort normal.    Musculoskeletal: Normal range of motion.  Neurological: She is oriented to person, place, and time.  Skin: Skin is warm and dry.  Psychiatric: She has a normal mood and affect. Her behavior is normal.  Vitals reviewed.   RECENT LABS AND TESTS: BMET    Component Value Date/Time   NA 142 03/17/2017 1042   K 5.0 03/17/2017 1042   CL 106 03/17/2017 1042   CO2 19 (L) 03/17/2017 1042   GLUCOSE 91 03/17/2017 1042   GLUCOSE 81 10/27/2016 1804   BUN 12 03/17/2017 1042   CREATININE 0.76 03/17/2017 1042   CREATININE 0.82 06/08/2014 1627   CALCIUM 9.5 03/17/2017 1042   GFRNONAA 99 03/17/2017 1042   GFRAA 114 03/17/2017 1042   Lab Results  Component Value Date   HGBA1C 5.6 03/17/2017   HGBA1C 5.7 06/11/2015   Lab Results  Component Value Date   INSULIN 25.7 (H) 03/17/2017   CBC    Component Value Date/Time   WBC 11.5 (H) 03/17/2017 1042   WBC 6.9 06/11/2015 1024   RBC 4.83 03/17/2017 1042   RBC 4.74 06/11/2015 1024   HGB 14.4 03/17/2017 1042   HCT 43.1 03/17/2017 1042   PLT 289.0 06/11/2015 1024   MCV 89 03/17/2017 1042   MCH 29.8 03/17/2017 1042   MCH 30.1 06/08/2014 1627   MCHC 33.4 03/17/2017 1042   MCHC 33.5 06/11/2015 1024   RDW 13.8 03/17/2017 1042   LYMPHSABS 3.5 (H) 03/17/2017 1042   MONOABS 0.6 06/11/2015 1024   EOSABS 0.2 03/17/2017 1042   BASOSABS 0.0 03/17/2017 1042   Iron/TIBC/Ferritin/ %Sat No results found for: IRON, TIBC, FERRITIN, IRONPCTSAT Lipid Panel     Component Value Date/Time   CHOL 156 03/17/2017 1042   TRIG 74 03/17/2017 1042   HDL 47 03/17/2017 1042   CHOLHDL 4 06/11/2015 1024   VLDL 10.4 06/11/2015 1024   LDLCALC 94 03/17/2017 1042   Hepatic Function Panel     Component Value Date/Time   PROT 7.3 03/17/2017 1042   ALBUMIN 4.6 03/17/2017 1042   AST 16 03/17/2017 1042   ALT 18 03/17/2017 1042   ALKPHOS 89 03/17/2017 1042   BILITOT 0.7 03/17/2017 1042   BILIDIR 0.2 06/11/2015 1024   IBILI 0.6 06/08/2014 1627      Component  Value Date/Time   TSH 1.210 03/17/2017 1042   TSH 1.28 06/11/2015 1024   TSH 1.680 06/08/2014 1627    Ref. Range 03/17/2017 10:42  Vitamin D, 25-Hydroxy Latest Ref Range: 30.0 - 100.0 ng/mL 11.9 (L)   ASSESSMENT AND PLAN: Vitamin D deficiency  Pre-diabetes  Class 2 severe obesity with serious comorbidity and body mass index (BMI) of 39.0 to 39.9 in adult, unspecified obesity type (HCC)  PLAN:  Vitamin D Deficiency Drew was informed that low vitamin D levels contributes to fatigue and are associated with obesity, breast, and colon cancer. She agrees to continue to take prescription Vit D @50 ,000 IU every week and will follow up for routine testing of vitamin D, at least 2-3 times per year. She was informed of the risk of over-replacement of vitamin D and agrees to not increase her dose unless she discusses this with [name] first.  Pre-Diabetes Aradhana will continue to work on weight loss, exercise, and decreasing simple carbohydrates in her diet to help decrease the risk of diabetes. She was informed that eating too many simple carbohydrates or too many calories at one sitting increases the likelihood of GI side effects. Bryan will continue with the category 2 meal plan. Pollie agreed to follow up with [name] as directed to monitor her progress.  We spent > than 50% of the 15 minute visit on the counseling as documented in the note.  Obesity Charliegh is currently in the action stage of change. As such, her goal is to continue with weight loss efforts She has agreed to follow the Category 2 plan Adam has been instructed to work up to a goal of 150 minutes of combined cardio and strengthening exercise per week or four 10 minute walks and 5 minute resistance training 4 times, 2 times per week for weight loss and overall health benefits. We discussed the following Behavioral Modification Strategies today: increase H2O intake, no skipping meals, increasing lean protein intake and  emotional eating strategies  Ranessa has agreed to follow up with our clinic in 2 weeks. She was informed of the importance of frequent follow up visits to maximize her success with intensive lifestyle modifications for her multiple health conditions.   OBESITY BEHAVIORAL INTERVENTION VISIT  Today's visit was # 5 out of 22.  Starting weight: 259 lbs Starting date: 03/17/17 Today's weight : 236 lbs Today's date: 05/13/2017 Total lbs lost to date: 23 (Patients must lose 7 lbs in the first 6 months to continue with counseling)   ASK: We discussed the diagnosis of obesity with Lesle Chris today and Raylyn agreed to give [name] permission to discuss obesity behavioral modification therapy today.  ASSESS: Asuna has the diagnosis of obesity and her BMI today is 39.27 Shaunna is in the action stage of change   ADVISE: Tallula was educated on the multiple health risks of obesity as well as the benefit of weight loss to improve her health. She was advised of the need for long term treatment and the importance of lifestyle modifications.  AGREE: Multiple dietary modification options and treatment options were discussed and  Kazzandra agreed to the above obesity treatment plan.  I, [place] Crane, am acting as transcriptionist for Filbert Schilder, MD  I have reviewed the above documentation for accuracy and completeness, and I agree with the above. - Debbra Riding, MD

## 2017-05-20 ENCOUNTER — Telehealth: Payer: Self-pay | Admitting: Family Medicine

## 2017-05-20 NOTE — Telephone Encounter (Signed)
Copied from CRM [phone]. Topic: Quick Communication - See Telephone Encounter >> May 20, 2017  8:38 AM Lelon FrohlichGolden, Tashia, RMA wrote: CRM for notification. See Telephone encounter for: 05/20/17.pt called wanted to know if her FMLA paperwork can be filled out without her having to come in for an appointment Please call pt [phone]

## 2017-05-20 NOTE — Telephone Encounter (Signed)
Should we ask her what it is about first?

## 2017-05-21 NOTE — Telephone Encounter (Signed)
Patient dropped off paperwork,Paperwork placed in front office tray.  °

## 2017-05-21 NOTE — Telephone Encounter (Signed)
Absolutely; and I do not have the paperwork in my possession yet/SLS 03/22

## 2017-05-25 NOTE — Telephone Encounter (Signed)
Copied from CRM [phone]. Topic: Quick Communication - See Telephone Encounter >> May 20, 2017  8:38 AM Lelon FrohlichGolden, Tashia, RMA wrote: CRM for notification. See Telephone encounter for: 05/20/17.pt called wanted to know if her FMLA paperwork can be filled out without her having to come in for an appointment Please call pt [phone] >> May 25, 2017  3:37 PM Rudi CocoLathan, Hydia Copelin M, NT wrote: Pt. Calling about paper work seeing if it has been done. Pt. Would like a  Call back about this matter

## 2017-05-27 ENCOUNTER — Ambulatory Visit (INDEPENDENT_AMBULATORY_CARE_PROVIDER_SITE_OTHER): Payer: 59 | Admitting: Physician Assistant

## 2017-05-27 VITALS — BP 110/76 | HR 87 | Temp 98.6°F | Ht 65.0 in | Wt 233.0 lb

## 2017-05-27 DIAGNOSIS — R7303 Prediabetes: Secondary | ICD-10-CM

## 2017-05-27 DIAGNOSIS — Z9189 Other specified personal risk factors, not elsewhere classified: Secondary | ICD-10-CM | POA: Diagnosis not present

## 2017-05-27 DIAGNOSIS — Z6838 Body mass index (BMI) 38.0-38.9, adult: Secondary | ICD-10-CM

## 2017-05-27 DIAGNOSIS — E559 Vitamin D deficiency, unspecified: Secondary | ICD-10-CM | POA: Diagnosis not present

## 2017-05-27 MED ORDER — VITAMIN D (ERGOCALCIFEROL) 1.25 MG (50000 UNIT) PO CAPS: 50000.0000 [IU] | ORAL_CAPSULE | ORAL | 0 refills | Status: DC

## 2017-05-27 NOTE — Telephone Encounter (Signed)
Pt calling to check status of FMLA paperwork. Please call back to advise at [phone]. Pt has secure VM, ok to leave detailed msg.

## 2017-05-27 NOTE — Progress Notes (Signed)
Office: [phone]  /  Fax: [phone]   HPI:   Chief Complaint: OBESITY Jody Bryan is here to discuss her progress with her obesity treatment plan. She is on the Category 2 plan and is following her eating plan approximately 40 % of the time. She states she is doing resistance bands and push ups for 30 minutes 3 times per week. Jody Bryan continues to do well with weight loss. She continues to keep up with the required protein and states her hunger is well controlled.  Her weight is 233 lb (105.7 kg) today and has had a weight loss of 3 pounds over a period of 2 weeks since her last visit. She has lost 26 lbs since starting treatment with us.  Vitamin D Deficiency Jody Bryan has a diagnosis of vitamin D deficiency. She is currently taking prescription Vit D and denies nausea, vomiting or muscle weakness.  At risk for osteopenia and osteoporosis Jody Bryan is at higher risk of osteopenia and osteoporosis due to vitamin D deficiency.   Pre-Diabetes Jody Bryan has a diagnosis of pre-diabetes based on her elevated Hgb A1c and was informed this puts her at greater risk of developing diabetes. She is not on metformin and declines, and continues to work on diet and exercise to decrease risk of diabetes. She denies polyphagia, nausea or hypoglycemia.  ALLERGIES: No Known Allergies  MEDICATIONS: Current Outpatient Medications on File Prior to Visit  Medication Sig Dispense Refill  . carisoprodol (SOMA) 350 MG tablet Take 1 tablet (350 mg total) by mouth 4 (four) times daily as needed for muscle spasms. 30 tablet 0  . clotrimazole-betamethasone (LOTRISONE) cream Apply 1 application topically 2 (two) times daily. 30 g 0  . meloxicam (MOBIC) 15 MG tablet TAKE 1 TABLET BY MOUTH EVERY DAY 30 tablet 0  . naproxen sodium (ALEVE) 220 MG tablet Take 220 mg by mouth 2 (two) times daily as needed.    . Polyvinyl Alcohol-Povidone (CLEAR EYES ALL SEASONS) 5-6 MG/ML SOLN Apply 1 drop to eye daily as needed.      . traMADol (ULTRAM) 50 MG tablet Take 1 tablet (50 mg total) by mouth every 6 (six) hours as needed. 30 tablet 1  . Vitamin D, Ergocalciferol, (DRISDOL) 50000 units CAPS capsule Take 1 capsule (50,000 Units total) by mouth every 7 (seven) days. 4 capsule 0   No current facility-administered medications on file prior to visit.     PAST MEDICAL HISTORY: Past Medical History:  Diagnosis Date  . Bilateral leg edema   . Chicken pox   . Depression   . Drug use   . Dyspnea   . GERD (gastroesophageal reflux disease)   . HTN (hypertension)   . Low back pain     PAST SURGICAL HISTORY: No past surgical history on file.  SOCIAL HISTORY: Social History   Tobacco Use  . Smoking status: Current Some Day Smoker    Years: 2.00    Types: Cigarettes  . Smokeless tobacco: Never Used  Substance Use Topics  . Alcohol use: Yes    Alcohol/week: 0.0 oz    Comment: social  . Drug use: No    FAMILY HISTORY: Family History  Problem Relation Age of Onset  . Hypertension Mother   . Obesity Mother   . Hypertension Father   . Depression Father   . Sleep apnea Father   . Drug abuse Father   . Obesity Father   . Arthritis Paternal Grandmother   . Cancer Paternal Grandmother  breast  . Alcohol abuse Sister   . Stroke Sister   . Heart disease Maternal Grandmother   . Alcohol abuse Maternal Grandfather     ROS: Review of Systems  Constitutional: Positive for weight loss.  Gastrointestinal: Negative for nausea and vomiting.  Musculoskeletal:       Negative muscle weakness  Endo/Heme/Allergies:       Negative polyphagia Negative hypoglycemia    PHYSICAL EXAM: Blood pressure 110/76, pulse 87, temperature 98.6 F (37 C), temperature source Oral, height 5\' 5"  (1.651 m), weight 233 lb (105.7 kg), SpO2 97 %. Body mass index is 38.77 kg/m. Physical Exam  Constitutional: She is oriented to person, place, and time. She appears well-developed and well-nourished.  Cardiovascular:  Normal rate.  Pulmonary/Chest: Effort normal.  Musculoskeletal: Normal range of motion.  Neurological: She is oriented to person, place, and time.  Skin: Skin is warm and dry.  Psychiatric: She has a normal mood and affect. Her behavior is normal.  Vitals reviewed.   RECENT LABS AND TESTS: BMET    Component Value Date/Time   NA 142 03/17/2017 1042   K 5.0 03/17/2017 1042   CL 106 03/17/2017 1042   CO2 19 (L) 03/17/2017 1042   GLUCOSE 91 03/17/2017 1042   GLUCOSE 81 10/27/2016 1804   BUN 12 03/17/2017 1042   CREATININE 0.76 03/17/2017 1042   CREATININE 0.82 06/08/2014 1627   CALCIUM 9.5 03/17/2017 1042   GFRNONAA 99 03/17/2017 1042   GFRAA 114 03/17/2017 1042   Lab Results  Component Value Date   HGBA1C 5.6 03/17/2017   HGBA1C 5.7 06/11/2015   Lab Results  Component Value Date   INSULIN 25.7 (H) 03/17/2017   CBC    Component Value Date/Time   WBC 11.5 (H) 03/17/2017 1042   WBC 6.9 06/11/2015 1024   RBC 4.83 03/17/2017 1042   RBC 4.74 06/11/2015 1024   HGB 14.4 03/17/2017 1042   HCT 43.1 03/17/2017 1042   PLT 289.0 06/11/2015 1024   MCV 89 03/17/2017 1042   MCH 29.8 03/17/2017 1042   MCH 30.1 06/08/2014 1627   MCHC 33.4 03/17/2017 1042   MCHC 33.5 06/11/2015 1024   RDW 13.8 03/17/2017 1042   LYMPHSABS 3.5 (H) 03/17/2017 1042   MONOABS 0.6 06/11/2015 1024   EOSABS 0.2 03/17/2017 1042   BASOSABS 0.0 03/17/2017 1042   Iron/TIBC/Ferritin/ %Sat No results found for: IRON, TIBC, FERRITIN, IRONPCTSAT Lipid Panel     Component Value Date/Time   CHOL 156 03/17/2017 1042   TRIG 74 03/17/2017 1042   HDL 47 03/17/2017 1042   CHOLHDL 4 06/11/2015 1024   VLDL 10.4 06/11/2015 1024   LDLCALC 94 03/17/2017 1042   Hepatic Function Panel     Component Value Date/Time   PROT 7.3 03/17/2017 1042   ALBUMIN 4.6 03/17/2017 1042   AST 16 03/17/2017 1042   ALT 18 03/17/2017 1042   ALKPHOS 89 03/17/2017 1042   BILITOT 0.7 03/17/2017 1042   BILIDIR 0.2 06/11/2015 1024     IBILI 0.6 06/08/2014 1627      Component Value Date/Time   TSH 1.210 03/17/2017 1042   TSH 1.28 06/11/2015 1024   TSH 1.680 06/08/2014 1627  Results for NYHLA, MOUNTJOY (MRN [id number]) as of 05/27/2017 09:19  Ref. Range 03/17/2017 10:42  Vitamin D, 25-Hydroxy Latest Ref Range: 30.0 - 100.0 ng/mL 11.9 (L)    ASSESSMENT AND PLAN: Vitamin D deficiency - Plan: Vitamin D, Ergocalciferol, (DRISDOL) 50000 units CAPS capsule  Prediabetes  At risk  for osteopenia  Class 2 severe obesity with serious comorbidity and body mass index (BMI) of 38.0 to 38.9 in adult, unspecified obesity type (HCC)  PLAN:  Vitamin D Deficiency Jody Bryan was informed that low vitamin D levels contributes to fatigue and are associated with obesity, breast, and colon cancer. Jody Bryan agrees to continue taking prescription Vit D @50 ,000 IU every week #4 and we will refill for 1 month. She will follow up for routine testing of vitamin D, at least 2-3 times per year. She was informed of the risk of over-replacement of vitamin D and agrees to not increase her dose unless she discusses this with [name] first. Jody Bryan agrees to follow up with our clinic in 2 weeks.  At risk for osteopenia and osteoporosis Jody Bryan is at risk for osteopenia and osteoporsis due to her vitamin D deficiency. She was encouraged to take her vitamin D and follow her higher calcium diet and increase strengthening exercise to help strengthen her bones and decrease her risk of osteopenia and osteoporosis.  Pre-Diabetes Jody Bryan will continue to work on weight loss, diet, exercise, and decreasing simple carbohydrates in her diet to help decrease the risk of diabetes. We dicussed metformin including benefits and risks. She was informed that eating too many simple carbohydrates or too many calories at one sitting increases the likelihood of GI side effects. Jody Bryan declined metformin for now and a prescription was not written today. Jody Bryan agrees to  follow up with our clinic in 2 weeks as directed to monitor her progress.  Obesity Jody Bryan is currently in the action stage of change. As such, her goal is to continue with weight loss efforts She has agreed to follow the Category 2 plan Jody Bryan has been instructed to work up to a goal of 150 minutes of combined cardio and strengthening exercise per week for weight loss and overall health benefits. We discussed the following Behavioral Modification Strategies today: increasing lean protein intake and work on meal planning and easy cooking plans   Jody Bryan has agreed to follow up with our clinic in 2 weeks. She was informed of the importance of frequent follow up visits to maximize her success with intensive lifestyle modifications for her multiple health conditions.   OBESITY BEHAVIORAL INTERVENTION VISIT  Today's visit was # 6 out of 22.  Starting weight: 259 lbs Starting date: 03/17/17 Today's weight : 233 lbs Today's date: 05/27/2017 Total lbs lost to date: 11 (Patients must lose 7 lbs in the first 6 months to continue with counseling)   ASK: We discussed the diagnosis of obesity with Jody Bryan today and Jody Bryan agreed to give [name] permission to discuss obesity behavioral modification therapy today.  ASSESS: Jody Bryan has the diagnosis of obesity and her BMI today is 38.77 Jody Bryan is in the action stage of change   ADVISE: Jody Bryan was educated on the multiple health risks of obesity as well as the benefit of weight loss to improve her health. She was advised of the need for long term treatment and the importance of lifestyle modifications.  AGREE: Multiple dietary modification options and treatment options were discussed and  Jody Bryan agreed to the above obesity treatment plan.   Jody Bryan, am acting as transcriptionist for Illa Level, PA-C I, Illa Level [facility], have reviewed this note and agree with its content

## 2017-05-28 NOTE — Telephone Encounter (Signed)
Completed as much as possible on FMLA paperwork; forwarded to provider/SLS 03/29

## 2017-06-01 NOTE — Telephone Encounter (Signed)
Spoke with patient on 05/31/17, informed paperwork complete and ready for pick-up [we had no forwarding fax number]; pt understood & agreed. Copy made for scan/SLS

## 2017-06-08 ENCOUNTER — Ambulatory Visit (INDEPENDENT_AMBULATORY_CARE_PROVIDER_SITE_OTHER): Payer: 59 | Admitting: Family Medicine

## 2017-06-08 VITALS — BP 130/80 | HR 95 | Temp 98.5°F | Ht 65.0 in | Wt 234.0 lb

## 2017-06-08 DIAGNOSIS — Z9189 Other specified personal risk factors, not elsewhere classified: Secondary | ICD-10-CM

## 2017-06-08 DIAGNOSIS — Z6839 Body mass index (BMI) 39.0-39.9, adult: Secondary | ICD-10-CM

## 2017-06-08 DIAGNOSIS — R7303 Prediabetes: Secondary | ICD-10-CM

## 2017-06-08 DIAGNOSIS — E559 Vitamin D deficiency, unspecified: Secondary | ICD-10-CM

## 2017-06-08 MED ORDER — VITAMIN D (ERGOCALCIFEROL) 1.25 MG (50000 UNIT) PO CAPS: 50000.0000 [IU] | ORAL_CAPSULE | ORAL | 0 refills | Status: DC

## 2017-06-08 NOTE — Progress Notes (Signed)
Office: [phone]  /  Fax: [phone]   HPI:   Chief Complaint: OBESITY Jody Bryan is here to discuss her progress with her obesity treatment plan. She is on the Category 2 plan and is following her eating plan approximately 40 % of the time. She states she is doing sit to stand, band resistance and walking for 30 minutes 7 times per week. Jody Bryan had a day of indulgence recently. She has a birthday party for her uncle and Odis Luster celebrations coming up. May is still exercising. Her weight is 234 lb (106.1 kg) today and has had a weight gain of 1 pound over a period of 1 to 2 weeks since her last visit. She has lost 25 lbs since starting treatment with [name].  Vitamin D deficiency Jody Bryan has a diagnosis of vitamin D deficiency. She is currently taking vit D and denies nausea, vomiting or muscle weakness.  At risk for osteopenia and osteoporosis Jody Bryan is at higher risk of osteopenia and osteoporosis due to vitamin D deficiency.   Pre-Diabetes Jody Bryan has a diagnosis of pre-diabetes based on her elevated Hgb A1c and was informed this puts her at greater risk of developing diabetes. She is not taking metformin currently and continues to work on diet and exercise to decrease risk of diabetes. She denies carb cravings.  ALLERGIES: No Known Allergies  MEDICATIONS: Current Outpatient Medications on File Prior to Visit  Medication Sig Dispense Refill  . carisoprodol (SOMA) 350 MG tablet Take 1 tablet (350 mg total) by mouth 4 (four) times daily as needed for muscle spasms. 30 tablet 0  . clotrimazole-betamethasone (LOTRISONE) cream Apply 1 application topically 2 (two) times daily. 30 g 0  . meloxicam (MOBIC) 15 MG tablet TAKE 1 TABLET BY MOUTH EVERY DAY 30 tablet 0  . naproxen sodium (ALEVE) 220 MG tablet Take 220 mg by mouth 2 (two) times daily as needed.    . Polyvinyl Alcohol-Povidone (CLEAR EYES ALL SEASONS) 5-6 MG/ML SOLN Apply 1 drop to eye daily as needed.    . traMADol  (ULTRAM) 50 MG tablet Take 1 tablet (50 mg total) by mouth every 6 (six) hours as needed. 30 tablet 1   No current facility-administered medications on file prior to visit.     PAST MEDICAL HISTORY: Past Medical History:  Diagnosis Date  . Bilateral leg edema   . Chicken pox   . Depression   . Drug use   . Dyspnea   . GERD (gastroesophageal reflux disease)   . HTN (hypertension)   . Low back pain     PAST SURGICAL HISTORY: No past surgical history on file.  SOCIAL HISTORY: Social History   Tobacco Use  . Smoking status: Current Some Day Smoker    Years: 2.00    Types: Cigarettes  . Smokeless tobacco: Never Used  Substance Use Topics  . Alcohol use: Yes    Alcohol/week: 0.0 oz    Comment: social  . Drug use: No    FAMILY HISTORY: Family History  Problem Relation Age of Onset  . Hypertension Mother   . Obesity Mother   . Hypertension Father   . Depression Father   . Sleep apnea Father   . Drug abuse Father   . Obesity Father   . Arthritis Paternal Grandmother   . Cancer Paternal Grandmother        breast  . Alcohol abuse Sister   . Stroke Sister   . Heart disease Maternal Grandmother   . Alcohol abuse Maternal  Grandfather     ROS: Review of Systems  Constitutional: Negative for weight loss.  Gastrointestinal: Negative for nausea and vomiting.  Musculoskeletal:       Negative for muscle weakness  Endo/Heme/Allergies:       Negative for carb cravings    PHYSICAL EXAM: Blood pressure 130/80, pulse 95, temperature 98.5 F (36.9 C), temperature source Oral, height 5\' 5"  (1.651 m), weight 234 lb (106.1 kg), SpO2 99 %. Body mass index is 38.94 kg/m. Physical Exam  Constitutional: She is oriented to person, place, and time. She appears well-developed and well-nourished.  Cardiovascular: Normal rate.  Pulmonary/Chest: Effort normal.  Musculoskeletal: Normal range of motion.  Neurological: She is oriented to person, place, and time.  Skin: Skin is warm  and dry.  Psychiatric: She has a normal mood and affect. Her behavior is normal.  Vitals reviewed.   RECENT LABS AND TESTS: BMET    Component Value Date/Time   NA 142 03/17/2017 1042   K 5.0 03/17/2017 1042   CL 106 03/17/2017 1042   CO2 19 (L) 03/17/2017 1042   GLUCOSE 91 03/17/2017 1042   GLUCOSE 81 10/27/2016 1804   BUN 12 03/17/2017 1042   CREATININE 0.76 03/17/2017 1042   CREATININE 0.82 06/08/2014 1627   CALCIUM 9.5 03/17/2017 1042   GFRNONAA 99 03/17/2017 1042   GFRAA 114 03/17/2017 1042   Lab Results  Component Value Date   HGBA1C 5.6 03/17/2017   HGBA1C 5.7 06/11/2015   Lab Results  Component Value Date   INSULIN 25.7 (H) 03/17/2017   CBC    Component Value Date/Time   WBC 11.5 (H) 03/17/2017 1042   WBC 6.9 06/11/2015 1024   RBC 4.83 03/17/2017 1042   RBC 4.74 06/11/2015 1024   HGB 14.4 03/17/2017 1042   HCT 43.1 03/17/2017 1042   PLT 289.0 06/11/2015 1024   MCV 89 03/17/2017 1042   MCH 29.8 03/17/2017 1042   MCH 30.1 06/08/2014 1627   MCHC 33.4 03/17/2017 1042   MCHC 33.5 06/11/2015 1024   RDW 13.8 03/17/2017 1042   LYMPHSABS 3.5 (H) 03/17/2017 1042   MONOABS 0.6 06/11/2015 1024   EOSABS 0.2 03/17/2017 1042   BASOSABS 0.0 03/17/2017 1042   Iron/TIBC/Ferritin/ %Sat No results found for: IRON, TIBC, FERRITIN, IRONPCTSAT Lipid Panel     Component Value Date/Time   CHOL 156 03/17/2017 1042   TRIG 74 03/17/2017 1042   HDL 47 03/17/2017 1042   CHOLHDL 4 06/11/2015 1024   VLDL 10.4 06/11/2015 1024   LDLCALC 94 03/17/2017 1042   Hepatic Function Panel     Component Value Date/Time   PROT 7.3 03/17/2017 1042   ALBUMIN 4.6 03/17/2017 1042   AST 16 03/17/2017 1042   ALT 18 03/17/2017 1042   ALKPHOS 89 03/17/2017 1042   BILITOT 0.7 03/17/2017 1042   BILIDIR 0.2 06/11/2015 1024   IBILI 0.6 06/08/2014 1627      Component Value Date/Time   TSH 1.210 03/17/2017 1042   TSH 1.28 06/11/2015 1024   TSH 1.680 06/08/2014 1627   Results for  Jody ChrisDWARDS, Ashleyanne (MRN [id number]) as of 06/08/2017 13:54  Ref. Range 03/17/2017 10:42  Vitamin D, 25-Hydroxy Latest Ref Range: 30.0 - 100.0 ng/mL 11.9 (L)   ASSESSMENT AND PLAN: Vitamin D deficiency - Plan: Vitamin D, Ergocalciferol, (DRISDOL) 50000 units CAPS capsule  Prediabetes  At risk for osteopenia  Class 2 severe obesity with serious comorbidity and body mass index (BMI) of 39.0 to 39.9 in adult, unspecified obesity type (  HCC)  PLAN:  Vitamin D Deficiency Jody Bryan was informed that low vitamin D levels contributes to fatigue and are associated with obesity, breast, and colon cancer. She agrees to continue to take prescription Vit D @50 ,000 IU every week #4 with no refills. We will repeat labs at the next visit. She will follow up for routine testing of vitamin D, at least 2-3 times per year. She was informed of the risk of over-replacement of vitamin D and agrees to not increase her dose unless she discusses this with [name] first.  At risk for osteopenia and osteoporosis Jody Bryan is at risk for osteopenia and osteoporosis due to her vitamin D deficiency. She was encouraged to take her vitamin D and follow her higher calcium diet and increase strengthening exercise to help strengthen her bones and decrease her risk of osteopenia and osteoporosis.  Pre-Diabetes Jody Bryan will continue to work on weight loss, exercise, and decreasing simple carbohydrates in her diet to help decrease the risk of diabetes. She was informed that eating too many simple carbohydrates or too many calories at one sitting increases the likelihood of GI side effects. We will repeat labs at the next visit and Jody Bryan agreed to follow up with [name] as directed to monitor her progress.  Obesity Jody Bryan is currently in the action stage of change. As such, her goal is to continue with weight loss efforts She has agreed to follow the Category 2 plan Jody Bryan has been instructed to work up to a goal of 150 minutes of  combined cardio and strengthening exercise per week or continue resistance training for weight loss and overall health benefits. We discussed the following Behavioral Modification Strategies today: increase H2O intake, no skipping meals, better snacking choices, celebration eating strategies, increasing lean protein intake and decreasing simple carbohydrates   Jody Bryan has agreed to follow up with our clinic in 2 weeks. She was informed of the importance of frequent follow up visits to maximize her success with intensive lifestyle modifications for her multiple health conditions.   OBESITY BEHAVIORAL INTERVENTION VISIT  Today's visit was # 7 out of 22.  Starting weight: 259 lbs Starting date: 03/17/17 Today's weight : 234 lbs Today's date: 06/08/2017 Total lbs lost to date: 25 (Patients must lose 7 lbs in the first 6 months to continue with counseling)   ASK: We discussed the diagnosis of obesity with Jody Bryan today and Jody Bryan agreed to give [name] permission to discuss obesity behavioral modification therapy today.  ASSESS: Jody Bryan has the diagnosis of obesity and her BMI today is 38.94 Jody Bryan is in the action stage of change   ADVISE: Jody Bryan was educated on the multiple health risks of obesity as well as the benefit of weight loss to improve her health. She was advised of the need for long term treatment and the importance of lifestyle modifications.  AGREE: Multiple dietary modification options and treatment options were discussed and  Jody Bryan agreed to the above obesity treatment plan.  I, [place] Crane, am acting as transcriptionist for Filbert Schilder, MD  I have reviewed the above documentation for accuracy and completeness, and I agree with the above. - Debbra Riding, MD

## 2017-06-09 ENCOUNTER — Encounter (INDEPENDENT_AMBULATORY_CARE_PROVIDER_SITE_OTHER): Payer: Self-pay | Admitting: Family Medicine

## 2017-06-10 ENCOUNTER — Ambulatory Visit (INDEPENDENT_AMBULATORY_CARE_PROVIDER_SITE_OTHER): Payer: 59 | Admitting: Physician Assistant

## 2017-06-24 ENCOUNTER — Ambulatory Visit (INDEPENDENT_AMBULATORY_CARE_PROVIDER_SITE_OTHER): Payer: 59 | Admitting: Family Medicine

## 2017-06-24 VITALS — BP 110/74 | HR 87 | Temp 99.4°F | Ht 65.0 in | Wt 226.0 lb

## 2017-06-24 DIAGNOSIS — R7303 Prediabetes: Secondary | ICD-10-CM | POA: Diagnosis not present

## 2017-06-24 DIAGNOSIS — E559 Vitamin D deficiency, unspecified: Secondary | ICD-10-CM

## 2017-06-24 DIAGNOSIS — Z9189 Other specified personal risk factors, not elsewhere classified: Secondary | ICD-10-CM | POA: Diagnosis not present

## 2017-06-24 DIAGNOSIS — Z6837 Body mass index (BMI) 37.0-37.9, adult: Secondary | ICD-10-CM

## 2017-06-24 MED ORDER — VITAMIN D (ERGOCALCIFEROL) 1.25 MG (50000 UNIT) PO CAPS: 50000.0000 [IU] | ORAL_CAPSULE | ORAL | 0 refills | Status: DC

## 2017-06-24 NOTE — Progress Notes (Signed)
Office: [phone]  /  Fax: [phone]   HPI:   Chief Complaint: OBESITY Jody Bryan is here to discuss her progress with her obesity treatment plan. She is on the Category 2 plan and is following her eating plan approximately 60 % of the time. She states she is doing crab crawls and Grokker exercise for 15 minutes 3 times per week. Jody Bryan had [place] and birthday party temptations. Jody Bryan indulged, but she didn't spiral into eating out of control. Her weight is 226 lb (102.5 kg) today and has had a weight loss of 8 pounds over a period of 2 weeks since her last visit. She has lost 33 lbs since starting treatment with [name].  Vitamin D deficiency Jody Bryan has a diagnosis of vitamin D deficiency. She is currently taking vit D and denies nausea, vomiting or muscle weakness.  At risk for osteopenia and osteoporosis Jody Bryan is at higher risk of osteopenia and osteoporosis due to vitamin D deficiency.   Pre-Diabetes Jody Bryan has a diagnosis of pre-diabetes based on her elevated Hgb A1c and was informed this puts her at greater risk of developing diabetes. She indulged one or two times. She is not taking metformin currently and continues to work on diet and exercise to decrease risk of diabetes. She denies any significant cravings or hypoglycemia.  ALLERGIES: No Known Allergies  MEDICATIONS: Current Outpatient Medications on File Prior to Visit  Medication Sig Dispense Refill  . clotrimazole-betamethasone (LOTRISONE) cream Apply 1 application topically 2 (two) times daily. 30 g 0  . cyclobenzaprine (FLEXERIL) 10 MG tablet Take 10 mg by mouth 3 (three) times daily as needed for muscle spasms.    . Polyvinyl Alcohol-Povidone (CLEAR EYES ALL SEASONS) 5-6 MG/ML SOLN Apply 1 drop to eye daily as needed.     No current facility-administered medications on file prior to visit.     PAST MEDICAL HISTORY: Past Medical History:  Diagnosis Date  . Bilateral leg edema   . Chicken pox   .  Depression   . Drug use   . Dyspnea   . GERD (gastroesophageal reflux disease)   . HTN (hypertension)   . Low back pain     PAST SURGICAL HISTORY: No past surgical history on file.  SOCIAL HISTORY: Social History   Tobacco Use  . Smoking status: Current Some Day Smoker    Years: 2.00    Types: Cigarettes  . Smokeless tobacco: Never Used  Substance Use Topics  . Alcohol use: Yes    Alcohol/week: 0.0 oz    Comment: social  . Drug use: No    FAMILY HISTORY: Family History  Problem Relation Age of Onset  . Hypertension Mother   . Obesity Mother   . Hypertension Father   . Depression Father   . Sleep apnea Father   . Drug abuse Father   . Obesity Father   . Arthritis Paternal Grandmother   . Cancer Paternal Grandmother        breast  . Alcohol abuse Sister   . Stroke Sister   . Heart disease Maternal Grandmother   . Alcohol abuse Maternal Grandfather     ROS: Review of Systems  Constitutional: Positive for weight loss.  Gastrointestinal: Negative for nausea and vomiting.  Musculoskeletal:       Negative for muscle weakness  Endo/Heme/Allergies:       Negative for hypoglycemia    PHYSICAL EXAM: Blood pressure 110/74, pulse 87, temperature 99.4 F (37.4 C), temperature source Oral, height 5\' 5"  (1.651  m), weight 226 lb (102.5 kg), SpO2 96 %. Body mass index is 37.61 kg/m. Physical Exam  Constitutional: She is oriented to person, place, and time. She appears well-developed and well-nourished.  Cardiovascular: Normal rate.  Pulmonary/Chest: Effort normal.  Musculoskeletal: Normal range of motion.  Neurological: She is oriented to person, place, and time.  Skin: Skin is warm and dry.  Psychiatric: She has a normal mood and affect. Her behavior is normal.  Vitals reviewed.   RECENT LABS AND TESTS: BMET    Component Value Date/Time   NA 142 03/17/2017 1042   K 5.0 03/17/2017 1042   CL 106 03/17/2017 1042   CO2 19 (L) 03/17/2017 1042   GLUCOSE 91  03/17/2017 1042   GLUCOSE 81 10/27/2016 1804   BUN 12 03/17/2017 1042   CREATININE 0.76 03/17/2017 1042   CREATININE 0.82 06/08/2014 1627   CALCIUM 9.5 03/17/2017 1042   GFRNONAA 99 03/17/2017 1042   GFRAA 114 03/17/2017 1042   Lab Results  Component Value Date   HGBA1C 5.6 03/17/2017   HGBA1C 5.7 06/11/2015   Lab Results  Component Value Date   INSULIN 25.7 (H) 03/17/2017   CBC    Component Value Date/Time   WBC 11.5 (H) 03/17/2017 1042   WBC 6.9 06/11/2015 1024   RBC 4.83 03/17/2017 1042   RBC 4.74 06/11/2015 1024   HGB 14.4 03/17/2017 1042   HCT 43.1 03/17/2017 1042   PLT 289.0 06/11/2015 1024   MCV 89 03/17/2017 1042   MCH 29.8 03/17/2017 1042   MCH 30.1 06/08/2014 1627   MCHC 33.4 03/17/2017 1042   MCHC 33.5 06/11/2015 1024   RDW 13.8 03/17/2017 1042   LYMPHSABS 3.5 (H) 03/17/2017 1042   MONOABS 0.6 06/11/2015 1024   EOSABS 0.2 03/17/2017 1042   BASOSABS 0.0 03/17/2017 1042   Iron/TIBC/Ferritin/ %Sat No results found for: IRON, TIBC, FERRITIN, IRONPCTSAT Lipid Panel     Component Value Date/Time   CHOL 156 03/17/2017 1042   TRIG 74 03/17/2017 1042   HDL 47 03/17/2017 1042   CHOLHDL 4 06/11/2015 1024   VLDL 10.4 06/11/2015 1024   LDLCALC 94 03/17/2017 1042   Hepatic Function Panel     Component Value Date/Time   PROT 7.3 03/17/2017 1042   ALBUMIN 4.6 03/17/2017 1042   AST 16 03/17/2017 1042   ALT 18 03/17/2017 1042   ALKPHOS 89 03/17/2017 1042   BILITOT 0.7 03/17/2017 1042   BILIDIR 0.2 06/11/2015 1024   IBILI 0.6 06/08/2014 1627      Component Value Date/Time   TSH 1.210 03/17/2017 1042   TSH 1.28 06/11/2015 1024   TSH 1.680 06/08/2014 1627   Results for Jody ChrisDWARDS, Joye (MRN [id number]) as of 06/24/2017 15:57  Ref. Range 03/17/2017 10:42  Vitamin D, 25-Hydroxy Latest Ref Range: 30.0 - 100.0 ng/mL 11.9 (L)   ASSESSMENT AND PLAN: Prediabetes - Plan: Hemoglobin A1c, Insulin, random  Vitamin D deficiency - Plan: VITAMIN D 25 Hydroxy (Vit-D  Deficiency, Fractures), Vitamin D, Ergocalciferol, (DRISDOL) 50000 units CAPS capsule  At risk for osteoporosis  Class 2 severe obesity with serious comorbidity and body mass index (BMI) of 37.0 to 37.9 in adult, unspecified obesity type (HCC)  PLAN:  Vitamin D Deficiency Jody Bryan was informed that low vitamin D levels contributes to fatigue and are associated with obesity, breast, and colon cancer. She agrees to continue to take prescription Vit D @50 ,000 IU every week #4 with no refills. We will check vitamin D level today and she will follow  up for routine testing of vitamin D, at least 2-3 times per year. She was informed of the risk of over-replacement of vitamin D and agrees to not increase her dose unless she discusses this with [name] first. Jody Bryan agrees to follow up with our clinic in 2 weeks.  At risk for osteopenia and osteoporosis Jody Bryan is at risk for osteopenia and osteoporosis due to her vitamin D deficiency. She was encouraged to take her vitamin D and follow her higher calcium diet and increase strengthening exercise to help strengthen her bones and decrease her risk of osteopenia and osteoporosis.  Pre-Diabetes Jody Bryan will continue to work on weight loss, exercise, and decreasing simple carbohydrates in her diet to help decrease the risk of diabetes.  She was informed that eating too many simple carbohydrates or too many calories at one sitting increases the likelihood of GI side effects. We will check Hgb A1c and insulin level today and Jody Bryan agreed to follow up with [name] as directed to monitor her progress.  Obesity Jody Bryan is currently in the action stage of change. As such, her goal is to continue with weight loss efforts She has agreed to follow the Category 2 plan Jody Bryan has been instructed to work up to a goal of 150 minutes of combined cardio and strengthening exercise per week for weight loss and overall health benefits. We discussed the following Behavioral  Modification Strategies today: planning for success, increasing lean protein intake, work on meal planning and easy cooking plans, dealing with family or coworker sabotage and avoiding temptations  Jody Bryan has agreed to follow up with our clinic in 2 weeks. She was informed of the importance of frequent follow up visits to maximize her success with intensive lifestyle modifications for her multiple health conditions.   OBESITY BEHAVIORAL INTERVENTION VISIT  Today's visit was # 8 out of 22.  Starting weight: 259 lbs Starting date: 03/17/17 Today's weight : 226 lbs Today's date: 06/24/2017 Total lbs lost to date: 47 (Patients must lose 7 lbs in the first 6 months to continue with counseling)   ASK: We discussed the diagnosis of obesity with Jody Bryan today and Jody Bryan agreed to give [name] permission to discuss obesity behavioral modification therapy today.  ASSESS: Jody Bryan has the diagnosis of obesity and her BMI today is 37.61 Jody Bryan is in the action stage of change   ADVISE: Jody Bryan was educated on the multiple health risks of obesity as well as the benefit of weight loss to improve her health. She was advised of the need for long term treatment and the importance of lifestyle modifications.  AGREE: Multiple dietary modification options and treatment options were discussed and  Jody Bryan agreed to the above obesity treatment plan.  I, Nevada Crane, am acting as transcriptionist for Filbert Schilder, MD  I have reviewed the above documentation for accuracy and completeness, and I agree with the above. - Debbra Riding, MD

## 2017-06-25 LAB — VITAMIN D 25 HYDROXY (VIT D DEFICIENCY, FRACTURES): VIT D 25 HYDROXY: 40.4 ng/mL (ref 30.0–100.0)

## 2017-06-25 LAB — HEMOGLOBIN A1C
ESTIMATED AVERAGE GLUCOSE: 108 mg/dL
Hgb A1c MFr Bld: 5.4 % (ref 4.8–5.6)

## 2017-06-25 LAB — INSULIN, RANDOM: INSULIN: 13.3 u[IU]/mL (ref 2.6–24.9)

## 2017-07-12 ENCOUNTER — Ambulatory Visit (INDEPENDENT_AMBULATORY_CARE_PROVIDER_SITE_OTHER): Payer: 59 | Admitting: Family Medicine

## 2017-07-12 VITALS — BP 132/76 | HR 66 | Temp 98.8°F | Ht 65.0 in | Wt 231.0 lb

## 2017-07-12 DIAGNOSIS — F3289 Other specified depressive episodes: Secondary | ICD-10-CM

## 2017-07-12 DIAGNOSIS — Z9189 Other specified personal risk factors, not elsewhere classified: Secondary | ICD-10-CM | POA: Diagnosis not present

## 2017-07-12 DIAGNOSIS — E559 Vitamin D deficiency, unspecified: Secondary | ICD-10-CM | POA: Diagnosis not present

## 2017-07-12 DIAGNOSIS — Z6838 Body mass index (BMI) 38.0-38.9, adult: Secondary | ICD-10-CM

## 2017-07-12 DIAGNOSIS — R7303 Prediabetes: Secondary | ICD-10-CM | POA: Diagnosis not present

## 2017-07-12 MED ORDER — VITAMIN D (ERGOCALCIFEROL) 1.25 MG (50000 UNIT) PO CAPS: 50000.0000 [IU] | ORAL_CAPSULE | ORAL | 0 refills | Status: DC

## 2017-07-12 NOTE — Progress Notes (Signed)
Office: [phone]  /  Fax: [phone]   HPI:   Chief Complaint: OBESITY Jody Bryan is here to discuss her progress with her obesity treatment plan. She is on the Category 2 plan and is following her eating plan approximately 60 % of the time. She states she is walking 30 minutes 3 times per week. Jody Bryan indulged over the past few days with increased salty foods. Her weight is 231 lb (104.8 kg) today and has had a weight gain of 5 pounds over a period of 2 weeks since her last visit. She has lost 28 lbs since starting treatment with [name].  Vitamin D deficiency Jody Bryan has a diagnosis of vitamin D deficiency. She is currently taking vit D and denies nausea, vomiting or muscle weakness.  At risk for osteopenia and osteoporosis Jody Bryan is at higher risk of osteopenia and osteoporosis due to vitamin D deficiency.   Pre-Diabetes Jody Bryan has a diagnosis of pre-diabetes and was informed this puts her at greater risk of developing diabetes.. Hgb A1c improved to 5.4 and insulin to 13.3  She is not taking metformin currently and continues to work on diet and exercise to decrease risk of diabetes. She denies nausea or hypoglycemia.  Depression with emotional eating behaviors Jody Bryan has impending court date for custody. She admits to emotional eating. Jody Bryan struggles with emotional eating and using food for comfort to the extent that it is negatively impacting her health. She often snacks when she is not hungry. Jody Bryan sometimes feels she is out of control and then feels guilty that she made poor food choices. She has been working on behavior modification techniques to help reduce her emotional eating and has been somewhat successful. She shows no sign of suicidal or homicidal ideations.   Depression screen [facility] 2/9 03/17/2017 10/27/2016 09/12/2015  Decreased Interest 2 0 0  Down, Depressed, Hopeless 3 0 0  PHQ - 2 Score 5 0 0  Altered sleeping 3 - -  Tired, decreased energy 3 - -  Change  in appetite 1 - -  Feeling bad or failure about yourself  1 - -  Trouble concentrating 0 - -  Moving slowly or fidgety/restless 1 - -  Suicidal thoughts 0 - -  PHQ-9 Score 14 - -  Difficult doing work/chores Extremely dIfficult - -     ALLERGIES: No Known Allergies  MEDICATIONS: Current Outpatient Medications on File Prior to Visit  Medication Sig Dispense Refill  . clotrimazole-betamethasone (LOTRISONE) cream Apply 1 application topically 2 (two) times daily. 30 g 0  . cyclobenzaprine (FLEXERIL) 10 MG tablet Take 10 mg by mouth 3 (three) times daily as needed for muscle spasms.    . Polyvinyl Alcohol-Povidone (CLEAR EYES ALL SEASONS) 5-6 MG/ML SOLN Apply 1 drop to eye daily as needed.     No current facility-administered medications on file prior to visit.     PAST MEDICAL HISTORY: Past Medical History:  Diagnosis Date  . Bilateral leg edema   . Chicken pox   . Depression   . Drug use   . Dyspnea   . GERD (gastroesophageal reflux disease)   . HTN (hypertension)   . Low back pain     PAST SURGICAL HISTORY: No past surgical history on file.  SOCIAL HISTORY: Social History   Tobacco Use  . Smoking status: Current Some Day Smoker    Years: 2.00    Types: Cigarettes  . Smokeless tobacco: Never Used  Substance Use Topics  . Alcohol use: Yes  Alcohol/week: 0.0 oz    Comment: social  . Drug use: No    FAMILY HISTORY: Family History  Problem Relation Age of Onset  . Hypertension Mother   . Obesity Mother   . Hypertension Father   . Depression Father   . Sleep apnea Father   . Drug abuse Father   . Obesity Father   . Arthritis Paternal Grandmother   . Cancer Paternal Grandmother        breast  . Alcohol abuse Sister   . Stroke Sister   . Heart disease Maternal Grandmother   . Alcohol abuse Maternal Grandfather     ROS: Review of Systems  Constitutional: Negative for weight loss.  Gastrointestinal: Negative for nausea and vomiting.    Musculoskeletal:       Negative for muscle weakness  Endo/Heme/Allergies:       Negative for hypoglycemia  Psychiatric/Behavioral: Positive for depression. Negative for suicidal ideas.    PHYSICAL EXAM: Blood pressure 132/76, pulse 66, temperature 98.8 F (37.1 C), temperature source Oral, height  (1.651 m), weight 231 lb (104.8 kg), SpO2 98 %. Body mass index is 38.44 kg/m. Physical Exam  Constitutional: She is oriented to person, place, and time. She appears well-developed and well-nourished.  Cardiovascular: Normal rate.  Pulmonary/Chest: Effort normal.  Musculoskeletal: Normal range of motion.  Neurological: She is oriented to person, place, and time.  Skin: Skin is warm and dry.  Vitals reviewed.   RECENT LABS AND TESTS: BMET    Component Value Date/Time   NA 142 03/17/2017 1042   K 5.0 03/17/2017 1042   CL 106 03/17/2017 1042   CO2 19 (L) 03/17/2017 1042   GLUCOSE 91 03/17/2017 1042   GLUCOSE 81 10/27/2016 1804   BUN 12 03/17/2017 1042   CREATININE 0.76 03/17/2017 1042   CREATININE 0.82 06/08/2014 1627   CALCIUM 9.5 03/17/2017 1042   GFRNONAA 99 03/17/2017 1042   GFRAA 114 03/17/2017 1042   Lab Results  Component Value Date   HGBA1C 5.4 06/24/2017   HGBA1C 5.6 03/17/2017   HGBA1C 5.7 06/11/2015   Lab Results  Component Value Date   INSULIN 13.3 06/24/2017   INSULIN 25.7 (H) 03/17/2017   CBC    Component Value Date/Time   WBC 11.5 (H) 03/17/2017 1042   WBC 6.9 06/11/2015 1024   RBC 4.83 03/17/2017 1042   RBC 4.74 06/11/2015 1024   HGB 14.4 03/17/2017 1042   HCT 43.1 03/17/2017 1042   PLT 289.0 06/11/2015 1024   MCV 89 03/17/2017 1042   MCH 29.8 03/17/2017 1042   MCH 30.1 06/08/2014 1627   MCHC 33.4 03/17/2017 1042   MCHC 33.5 06/11/2015 1024   RDW 13.8 03/17/2017 1042   LYMPHSABS 3.5 (H) 03/17/2017 1042   MONOABS 0.6 06/11/2015 1024   EOSABS 0.2 03/17/2017 1042   BASOSABS 0.0 03/17/2017 1042   Iron/TIBC/Ferritin/ %Sat No results  found for: IRON, TIBC, FERRITIN, IRONPCTSAT Lipid Panel     Component Value Date/Time   CHOL 156 03/17/2017 1042   TRIG 74 03/17/2017 1042   HDL 47 03/17/2017 1042   CHOLHDL 4 06/11/2015 1024   VLDL 10.4 06/11/2015 1024   LDLCALC 94 03/17/2017 1042   Hepatic Function Panel     Component Value Date/Time   PROT 7.3 03/17/2017 1042   ALBUMIN 4.6 03/17/2017 1042   AST 16 03/17/2017 1042   ALT 18 03/17/2017 1042   ALKPHOS 89 03/17/2017 1042   BILITOT 0.7 03/17/2017 1042   BILIDIR 0.2  06/11/2015 1024   IBILI 0.6 06/08/2014 1627      Component Value Date/Time   TSH 1.210 03/17/2017 1042   TSH 1.28 06/11/2015 1024   TSH 1.680 06/08/2014 1627   Results for YANELLY, CANTRELLE (MRN [id number]) as of 07/12/2017 12:13  Ref. Range 06/24/2017 08:04  Vitamin D, 25-Hydroxy Latest Ref Range: 30.0 - 100.0 ng/mL 40.4   ASSESSMENT AND PLAN: Vitamin D deficiency - Plan: Vitamin D, Ergocalciferol, (DRISDOL) 50000 units CAPS capsule  Prediabetes  Other depression - with emotional eating   At risk for osteoporosis  Class 2 severe obesity with serious comorbidity and body mass index (BMI) of 38.0 to 38.9 in adult, unspecified obesity type (HCC)  PLAN:  Vitamin D Deficiency Genever was informed that low vitamin D levels contributes to fatigue and are associated with obesity, breast, and colon cancer. She agrees to continue to take prescription Vit D ,000 IU every week #4 with no refills and will follow up for routine testing of vitamin D, at least 2-3 times per year. She was informed of the risk of over-replacement of vitamin D and agrees to not increase her dose unless she discusses this with [name] first. Fareeha agrees to follow up with our clinic in 2 weeks.  At risk for osteopenia and osteoporosis Kylie is at risk for osteopenia and osteoporosis due to her vitamin D deficiency. She was encouraged to take her vitamin D and follow her higher calcium diet and increase strengthening exercise  to help strengthen her bones and decrease her risk of osteopenia and osteoporosis.  Pre-Diabetes Morna will continue to work on weight loss, exercise, and decreasing simple carbohydrates in her diet to help decrease the risk of diabetes. She was informed that eating too many simple carbohydrates or too many calories at one sitting increases the likelihood of GI side effects. Yecenia agrees to continue with the category 3 plan and follow up with [name] as directed to monitor her progress.  Depression with Emotional Eating Behaviors We discussed behavior modification techniques today to help Maud deal with her emotional eating and depression. Darl is to be referred to Dr. Dewaine Conger.  Obesity Sandi is currently in the action stage of change. As such, her goal is to continue with weight loss efforts She has agreed to follow the Category 3 plan Kamisha has been instructed to work up to a goal of 150 minutes of combined cardio and strengthening exercise per week or resistance training up to 15 minutes 3 to 4 times per week on work app, for weight loss and overall health benefits. We discussed the following Behavioral Modification Strategies today: better snacking choices, planning for success, increasing lean protein intake, decrease eating out and work on meal planning and easy cooking plans  Charice has agreed to follow up with our clinic in 2 weeks. She was informed of the importance of frequent follow up visits to maximize her success with intensive lifestyle modifications for her multiple health conditions.   OBESITY BEHAVIORAL INTERVENTION VISIT  Today's visit was # 9 out of 22.  Starting weight: 259 lbs Starting date: 03/17/17 Today's weight : 231 lbs Today's date: 07/12/2017 Total lbs lost to date: 47 (Patients must lose 7 lbs in the first 6 months to continue with counseling)   ASK: We discussed the diagnosis of obesity with Lesle Chris today and Lorelai agreed to give  [name] permission to discuss obesity behavioral modification therapy today.  ASSESS: Cambelle has the diagnosis of obesity and her BMI today is 38.44  Azharia is in the action stage of change   ADVISE: Cristian was educated on the multiple health risks of obesity as well as the benefit of weight loss to improve her health. She was advised of the need for long term treatment and the importance of lifestyle modifications.  AGREE: Multiple dietary modification options and treatment options were discussed and  Mattelyn agreed to the above obesity treatment plan.  I, [place] Crane, am acting as transcriptionist for Filbert Schilder, MD  I have reviewed the above documentation for accuracy and completeness, and I agree with the above. - Debbra Riding, MD

## 2017-07-29 ENCOUNTER — Ambulatory Visit (INDEPENDENT_AMBULATORY_CARE_PROVIDER_SITE_OTHER): Payer: 59 | Admitting: Family Medicine

## 2017-07-29 VITALS — BP 130/79 | HR 70 | Temp 98.7°F | Ht 65.0 in | Wt 224.0 lb

## 2017-07-29 DIAGNOSIS — Z9189 Other specified personal risk factors, not elsewhere classified: Secondary | ICD-10-CM

## 2017-07-29 DIAGNOSIS — Z6837 Body mass index (BMI) 37.0-37.9, adult: Secondary | ICD-10-CM

## 2017-07-29 DIAGNOSIS — F3289 Other specified depressive episodes: Secondary | ICD-10-CM

## 2017-07-29 DIAGNOSIS — R7303 Prediabetes: Secondary | ICD-10-CM | POA: Diagnosis not present

## 2017-07-30 NOTE — Progress Notes (Signed)
Office: [phone]  /  Fax: [phone]   HPI:   Chief Complaint: OBESITY Jody Bryan is here to discuss her progress with her obesity treatment plan. She is on the  follow the Category 2 plan and is following her eating plan approximately 40 % of the time. She states she is exercising 30 minutes 7 times per week. Jody Bryan has had significant life stressors. Her weight is 224 lb (101.6 kg) today and has had a weight loss of 7 pounds over a period of 2 weeks since her last visit. She has lost 35 lbs since starting treatment with [name].  Pre-Diabetes Jody Bryan has a diagnosis of prediabetes based on her elevated HgA1c and was informed this puts her at greater risk of developing diabetes. She is not taking metformin currently and continues to work on diet and exercise to decrease risk of diabetes. She denies nausea or hypoglycemia. She does have carb cravings. Her HgbA1c has improved.   Depression with emotional eating behaviors Jody Bryan is struggling with emotional eating and using food for comfort to the extent that it is negatively impacting her health. She often snacks when she is not hungry. Jody Bryan sometimes feels she is out of control and then feels guilty that she made poor food choices. She has been working on behavior modification techniques to help reduce her emotional eating and has been minimally successful. She shows no sign of suicidal or homicidal ideations but does has life stressors concerning custody.   Depression screen [facility] 2/9 03/17/2017 10/27/2016 09/12/2015  Decreased Interest 2 0 0  Down, Depressed, Hopeless 3 0 0  PHQ - 2 Score 5 0 0  Altered sleeping 3 - -  Tired, decreased energy 3 - -  Change in appetite 1 - -  Feeling bad or failure about yourself  1 - -  Trouble concentrating 0 - -  Moving slowly or fidgety/restless 1 - -  Suicidal thoughts 0 - -  PHQ-9 Score 14 - -  Difficult doing work/chores Extremely dIfficult - -   Diabetes risk counselling Jody Bryan was  given extended (15 minutes) diabetes prevention counseling today. She is 37 y.o. female and has risk factors for diabetes including obesity. We discussed intensive lifestyle modifications today with an emphasis on weight loss as well as increasing exercise and decreasing simple carbohydrates in her diet.    ALLERGIES: No Known Allergies  MEDICATIONS: Current Outpatient Medications on File Prior to Visit  Medication Sig Dispense Refill  . clotrimazole-betamethasone (LOTRISONE) cream Apply 1 application topically 2 (two) times daily. 30 g 0  . cyclobenzaprine (FLEXERIL) 10 MG tablet Take 10 mg by mouth 3 (three) times daily as needed for muscle spasms.    . Polyvinyl Alcohol-Povidone (CLEAR EYES ALL SEASONS) 5-6 MG/ML SOLN Apply 1 drop to eye daily as needed.    . Vitamin D, Ergocalciferol, (DRISDOL) 50000 units CAPS capsule Take 1 capsule (50,000 Units total) by mouth every 7 (seven) days. 4 capsule 0   No current facility-administered medications on file prior to visit.     PAST MEDICAL HISTORY: Past Medical History:  Diagnosis Date  . Bilateral leg edema   . Chicken pox   . Depression   . Drug use   . Dyspnea   . GERD (gastroesophageal reflux disease)   . HTN (hypertension)   . Low back pain     PAST SURGICAL HISTORY: No past surgical history on file.  SOCIAL HISTORY: Social History   Tobacco Use  . Smoking status: Current Some Day  Smoker    Years: 2.00    Types: Cigarettes  . Smokeless tobacco: Never Used  Substance Use Topics  . Alcohol use: Yes    Alcohol/week: 0.0 oz    Comment: social  . Drug use: No    FAMILY HISTORY: Family History  Problem Relation Age of Onset  . Hypertension Mother   . Obesity Mother   . Hypertension Father   . Depression Father   . Sleep apnea Father   . Drug abuse Father   . Obesity Father   . Arthritis Paternal Grandmother   . Cancer Paternal Grandmother        breast  . Alcohol abuse Sister   . Stroke Sister   . Heart  disease Maternal Grandmother   . Alcohol abuse Maternal Grandfather     ROS: Review of Systems  Constitutional: Positive for weight loss.  Psychiatric/Behavioral:       Stress  All other systems reviewed and are negative.   PHYSICAL EXAM: Blood pressure 130/79, pulse 70, temperature 98.7 F (37.1 C), temperature source Oral, height  (1.651 m), weight 224 lb (101.6 kg), SpO2 100 %. Body mass index is 37.28 kg/m. Physical Exam  Constitutional: She is oriented to person, place, and time. She appears well-developed.  Eyes: EOM are normal.  Neck: Normal range of motion.  Pulmonary/Chest: Effort normal.  Musculoskeletal: Normal range of motion.  Neurological: She is alert and oriented to person, place, and time.  Skin: Skin is warm and dry.  Psychiatric: She has a normal mood and affect.  Vitals reviewed.   RECENT LABS AND TESTS: BMET    Component Value Date/Time   NA 142 03/17/2017 1042   K 5.0 03/17/2017 1042   CL 106 03/17/2017 1042   CO2 19 (L) 03/17/2017 1042   GLUCOSE 91 03/17/2017 1042   GLUCOSE 81 10/27/2016 1804   BUN 12 03/17/2017 1042   CREATININE 0.76 03/17/2017 1042   CREATININE 0.82 06/08/2014 1627   CALCIUM 9.5 03/17/2017 1042   GFRNONAA 99 03/17/2017 1042   GFRAA 114 03/17/2017 1042   Lab Results  Component Value Date   HGBA1C 5.4 06/24/2017   HGBA1C 5.6 03/17/2017   HGBA1C 5.7 06/11/2015   Lab Results  Component Value Date   INSULIN 13.3 06/24/2017   INSULIN 25.7 (H) 03/17/2017   CBC    Component Value Date/Time   WBC 11.5 (H) 03/17/2017 1042   WBC 6.9 06/11/2015 1024   RBC 4.83 03/17/2017 1042   RBC 4.74 06/11/2015 1024   HGB 14.4 03/17/2017 1042   HCT 43.1 03/17/2017 1042   PLT 289.0 06/11/2015 1024   MCV 89 03/17/2017 1042   MCH 29.8 03/17/2017 1042   MCH 30.1 06/08/2014 1627   MCHC 33.4 03/17/2017 1042   MCHC 33.5 06/11/2015 1024   RDW 13.8 03/17/2017 1042   LYMPHSABS 3.5 (H) 03/17/2017 1042   MONOABS 0.6 06/11/2015 1024    EOSABS 0.2 03/17/2017 1042   BASOSABS 0.0 03/17/2017 1042   Iron/TIBC/Ferritin/ %Sat No results found for: IRON, TIBC, FERRITIN, IRONPCTSAT Lipid Panel     Component Value Date/Time   CHOL 156 03/17/2017 1042   TRIG 74 03/17/2017 1042   HDL 47 03/17/2017 1042   CHOLHDL 4 06/11/2015 1024   VLDL 10.4 06/11/2015 1024   LDLCALC 94 03/17/2017 1042   Hepatic Function Panel     Component Value Date/Time   PROT 7.3 03/17/2017 1042   ALBUMIN 4.6 03/17/2017 1042   AST 16 03/17/2017 1042  ALT 18 03/17/2017 1042   ALKPHOS 89 03/17/2017 1042   BILITOT 0.7 03/17/2017 1042   BILIDIR 0.2 06/11/2015 1024   IBILI 0.6 06/08/2014 1627      Component Value Date/Time   TSH 1.210 03/17/2017 1042   TSH 1.28 06/11/2015 1024   TSH 1.680 06/08/2014 1627    ASSESSMENT AND PLAN: Prediabetes  Other depression - with emotional eating  At risk for diabetes mellitus  Class 2 severe obesity with serious comorbidity and body mass index (BMI) of 37.0 to 37.9 in adult, unspecified obesity type (HCC)  PLAN: Pre-Diabetes Jody Bryan will continue to work on weight loss, exercise, and decreasing simple carbohydrates in her diet to help decrease the risk of diabetes. We dicussed metformin including benefits and risks. She was informed that eating too many simple carbohydrates or too many calories at one sitting increases the likelihood of GI side effects. Jody Bryan declined metformin for now and a prescription was not written today. Jody Bryan agreed to follow up with [name] as directed to monitor her progress.  Depression with Emotional Eating Behaviors We discussed behavior modification techniques today to help Jody Bryan deal with her emotional eating and depression. She has agreed to take Jody Bryan and agreed to follow up as directed. Will refer to Jody Bryan for an evaluation.   Obesity Jody Bryan is currently in the action stage of change. As such, her goal is to continue with weight loss  efforts She has agreed to follow the Category 2 plan along with journaling 400-500 calories and 30 grams of protein for supper. Jody Bryan has been instructed to work up to a goal of 150 minutes of combined cardio and strengthening exercise per week for weight loss and overall health benefits. Will increase weight of resistance training.  We discussed the following Behavioral Modification Stratagies today: increasing lean protein intake, increasing vegetables, work on meal planning and easy cooking plans, no skipping meals and increase her H2O intake   Jody Bryan has agreed to follow up with our clinic in 3 weeks. She was informed of the importance of frequent follow up visits to maximize her success with intensive lifestyle modifications for her multiple health conditions.   OBESITY BEHAVIORAL INTERVENTION VISIT  Today's visit was # 10 out of 22.  Starting weight: 259lb Starting date: 03/17/16 Today's weight : Weight: 224 lb (101.6 kg)  Today's date: 07/30/2017 Total lbs lost to date: 35 (Patients must lose 7 lbs in the first 6 months to continue with counseling)   ASK: We discussed the diagnosis of obesity with Jody Bryan today and Amilee agreed to give [name] permission to discuss obesity behavioral modification therapy today.  ASSESS: Jody Bryan has the diagnosis of obesity and her BMI today is @ Jody Bryan is in the action stage of change   ADVISE: Jody Bryan was educated on the multiple health risks of obesity as well as the benefit of weight loss to improve her health. She was advised of the need for long term treatment and the importance of lifestyle modifications.  AGREE: Multiple dietary modification options and treatment options were discussed and  Jody Bryan agreed to the above obesity treatment plan.  I, April Moore, am acting as Energy manager for Ashley Royalty, MD  I have reviewed the above documentation for accuracy and completeness, and I agree with the above. -  Debbra Riding, MD

## 2017-08-09 ENCOUNTER — Other Ambulatory Visit (HOSPITAL_COMMUNITY)
Admission: RE | Admit: 2017-08-09 | Discharge: 2017-08-09 | Disposition: A | Discharge status: Home / Self Care | Payer: 59 | Source: Ambulatory Visit | Attending: Family Medicine | Admitting: Family Medicine

## 2017-08-09 ENCOUNTER — Ambulatory Visit (INDEPENDENT_AMBULATORY_CARE_PROVIDER_SITE_OTHER): Payer: 59 | Admitting: Family Medicine

## 2017-08-09 ENCOUNTER — Encounter: Payer: Self-pay | Admitting: Family Medicine

## 2017-08-09 VITALS — BP 130/67 | HR 78 | Temp 98.6°F | Resp 18 | Ht 64.96 in | Wt 233.2 lb

## 2017-08-09 DIAGNOSIS — Z124 Encounter for screening for malignant neoplasm of cervix: Secondary | ICD-10-CM | POA: Diagnosis not present

## 2017-08-09 DIAGNOSIS — Z0000 Encounter for general adult medical examination without abnormal findings: Secondary | ICD-10-CM | POA: Insufficient documentation

## 2017-08-09 DIAGNOSIS — R69 Illness, unspecified: Secondary | ICD-10-CM | POA: Diagnosis not present

## 2017-08-09 DIAGNOSIS — Z1151 Encounter for screening for human papillomavirus (HPV): Secondary | ICD-10-CM | POA: Diagnosis not present

## 2017-08-09 DIAGNOSIS — R87610 Atypical squamous cells of undetermined significance on cytologic smear of cervix (ASC-US): Secondary | ICD-10-CM | POA: Insufficient documentation

## 2017-08-09 NOTE — Assessment & Plan Note (Signed)
ghm utd Check labs See AVS 

## 2017-08-09 NOTE — Progress Notes (Signed)
Subjective:  I acted as a Neurosurgeon for [employer]. Fuller Song, RMA   Patient ID: Jody Bryan, female    DOB: [birth year], 37 y.o.   MRN: [id number]  Chief Complaint  Patient presents with  . Annual Exam    HPI  Patient is in today for annual exam.  No complaints.  Patient Care Team: Zola Button, Grayling Congress, DO as PCP - General (Family Medicine)   Past Medical History:  Diagnosis Date  . Bilateral leg edema   . Chicken pox   . Depression   . Drug use   . Dyspnea   . GERD (gastroesophageal reflux disease)   . HTN (hypertension)   . Low back pain     No past surgical history on file.  Family History  Problem Relation Age of Onset  . Hypertension Mother   . Obesity Mother   . Hypertension Father   . Depression Father   . Sleep apnea Father   . Drug abuse Father   . Obesity Father   . Arthritis Paternal Grandmother   . Cancer Paternal Grandmother        breast  . Alcohol abuse Sister   . Stroke Sister   . Heart disease Maternal Grandmother   . Alcohol abuse Maternal Grandfather     Social History   Socioeconomic History  . Marital status: Single    Spouse name: Not on file  . Number of children: 1  . Years of education: Not on file  . Highest education level: Not on file  Occupational History  . Occupation:  call center  Social Needs  . Financial resource strain: Not on file  . Food insecurity:    Worry: Not on file    Inability: Not on file  . Transportation needs:    Medical: Not on file    Non-medical: Not on file  Tobacco Use  . Smoking status: Current Some Day Smoker    Years: 2.00    Types: Cigarettes  . Smokeless tobacco: Never Used  Substance and Sexual Activity  . Alcohol use: Yes    Alcohol/week: 0.0 oz    Comment: social  . Drug use: No  . Sexual activity: Not on file  Lifestyle  . Physical activity:    Days per week: Not on file    Minutes per session: Not on file  . Stress: Not on file  Relationships  . Social connections:      Talks on phone: Not on file    Gets together: Not on file    Attends religious service: Not on file    Active member of club or organization: Not on file    Attends meetings of clubs or organizations: Not on file    Relationship status: Not on file  . Intimate partner violence:    Fear of current or ex partner: Not on file    Emotionally abused: Not on file    Physically abused: Not on file    Forced sexual activity: Not on file  Other Topics Concern  . Not on file  Social History Narrative  . Not on file    Outpatient Medications Prior to Visit  Medication Sig Dispense Refill  . clotrimazole-betamethasone (LOTRISONE) cream Apply 1 application topically 2 (two) times daily. 30 g 0  . cyclobenzaprine (FLEXERIL) 10 MG tablet Take 10 mg by mouth 3 (three) times daily as needed for muscle spasms.    . Polyvinyl Alcohol-Povidone (CLEAR EYES ALL SEASONS) 5-6 MG/ML SOLN Apply  1 drop to eye daily as needed.    . Vitamin D, Ergocalciferol, (DRISDOL) 50000 units CAPS capsule Take 1 capsule (50,000 Units total) by mouth every 7 (seven) days. 4 capsule 0   No facility-administered medications prior to visit.     No Known Allergies  Review of Systems  Constitutional: Negative for chills, fever and malaise/fatigue.  HENT: Negative for congestion and hearing loss.   Eyes: Negative for discharge.  Respiratory: Negative for cough, sputum production and shortness of breath.   Cardiovascular: Negative for chest pain, palpitations and leg swelling.  Gastrointestinal: Negative for abdominal pain, blood in stool, constipation, diarrhea, heartburn, nausea and vomiting.  Genitourinary: Negative for dysuria, frequency, hematuria and urgency.  Musculoskeletal: Negative for back pain, falls and myalgias.  Skin: Negative for rash.  Neurological: Negative for dizziness, sensory change, loss of consciousness, weakness and headaches.  Endo/Heme/Allergies: Negative for environmental allergies. Does not  bruise/bleed easily.  Psychiatric/Behavioral: Negative for depression and suicidal ideas. The patient is not nervous/anxious and does not have insomnia.        Objective:    Physical Exam  Constitutional: She is oriented to person, place, and time. She appears well-developed and well-nourished. No distress.  HENT:  Right Ear: External ear normal.  Left Ear: External ear normal.  Nose: Nose normal.  Mouth/Throat: Oropharynx is clear and moist.  Eyes: Pupils are equal, round, and reactive to light. EOM are normal.  Neck: Normal range of motion. Neck supple.  Cardiovascular: Normal rate, regular rhythm and normal heart sounds.  No murmur heard. Pulmonary/Chest: Effort normal and breath sounds normal. No respiratory distress. She has no wheezes. She has no rales. She exhibits no tenderness.  Neurological: She is alert and oriented to person, place, and time.  Psychiatric: She has a normal mood and affect. Her behavior is normal. Judgment and thought content normal.  Nursing note and vitals reviewed.   BP 130/67 (BP Location: Left Arm, Patient Position: Sitting, Cuff Size: Large)   Pulse 78   Temp 98.6 F (37 C) (Oral)   Resp 18   Ht 5' 4.96" (1.65 m)   Wt 233 lb 3.2 oz (105.8 kg)   SpO2 100%   BMI 38.85 kg/m  Wt Readings from Last 3 Encounters:  08/09/17 233 lb 3.2 oz (105.8 kg)  07/29/17 224 lb (101.6 kg)  07/12/17 231 lb (104.8 kg)   BP Readings from Last 3 Encounters:  08/09/17 130/67  07/29/17 130/79  07/12/17 132/76     Immunization History  Administered Date(s) Administered  . Influenza-Unspecified 01/08/2016  . Tdap 08/01/2010    Health Maintenance  Topic Date Due  . PAP SMEAR  06/07/2017  . INFLUENZA VACCINE  09/30/2017  . TETANUS/TDAP  07/31/2020  . HIV Screening  Completed    Lab Results  Component Value Date   WBC 11.5 (H) 03/17/2017   HGB 14.4 03/17/2017   HCT 43.1 03/17/2017   PLT 289.0 06/11/2015   GLUCOSE 91 03/17/2017   CHOL 156 03/17/2017     TRIG 74 03/17/2017   HDL 47 03/17/2017   LDLCALC 94 03/17/2017   ALT 18 03/17/2017   AST 16 03/17/2017   NA 142 03/17/2017   K 5.0 03/17/2017   CL 106 03/17/2017   CREATININE 0.76 03/17/2017   BUN 12 03/17/2017   CO2 19 (L) 03/17/2017   TSH 1.210 03/17/2017   HGBA1C 5.4 06/24/2017    Lab Results  Component Value Date   TSH 1.210 03/17/2017   Lab Results  Component Value Date   WBC 11.5 (H) 03/17/2017   HGB 14.4 03/17/2017   HCT 43.1 03/17/2017   MCV 89 03/17/2017   PLT 289.0 06/11/2015   Lab Results  Component Value Date   NA 142 03/17/2017   K 5.0 03/17/2017   CO2 19 (L) 03/17/2017   GLUCOSE 91 03/17/2017   BUN 12 03/17/2017   CREATININE 0.76 03/17/2017   BILITOT 0.7 03/17/2017   ALKPHOS 89 03/17/2017   AST 16 03/17/2017   ALT 18 03/17/2017   PROT 7.3 03/17/2017   ALBUMIN 4.6 03/17/2017   CALCIUM 9.5 03/17/2017   GFR 89.81 10/27/2016   Lab Results  Component Value Date   CHOL 156 03/17/2017   Lab Results  Component Value Date   HDL 47 03/17/2017   Lab Results  Component Value Date   LDLCALC 94 03/17/2017   Lab Results  Component Value Date   TRIG 74 03/17/2017   Lab Results  Component Value Date   CHOLHDL 4 06/11/2015   Lab Results  Component Value Date   HGBA1C 5.4 06/24/2017      labs reviewed   Assessment & Plan:   Problem List Items Addressed This Visit      Unprioritized   Preventative health care - Primary    ghm utd Check labs See AVS       Other Visit Diagnoses    Cervical cancer screening       Relevant Orders   Cytology - PAP      I am having Wilsie Allor maintain her clotrimazole-betamethasone, Polyvinyl Alcohol-Povidone, cyclobenzaprine, and Vitamin D (Ergocalciferol).  No orders of the defined types were placed in this encounter.   CMA served as Neurosurgeon during this visit. History, Physical and Plan performed by medical provider. Documentation and orders reviewed and attested to.  Donato Schultz,  DO

## 2017-08-09 NOTE — Patient Instructions (Signed)
Preventive Care 18-39 Years, Female Preventive care refers to lifestyle choices and visits with your health care provider that can promote health and wellness. What does preventive care include?  A yearly physical exam. This is also called an annual well check.  Dental exams once or twice a year.  Routine eye exams. Ask your health care provider how often you should have your eyes checked.  Personal lifestyle choices, including: ? Daily care of your teeth and gums. ? Regular physical activity. ? Eating a healthy diet. ? Avoiding tobacco and drug use. ? Limiting alcohol use. ? Practicing safe sex. ? Taking vitamin and mineral supplements as recommended by your health care provider. What happens during an annual well check? The services and screenings done by your health care provider during your annual well check will depend on your age, overall health, lifestyle risk factors, and family history of disease. Counseling Your health care provider may ask you questions about your:  Alcohol use.  Tobacco use.  Drug use.  Emotional well-being.  Home and relationship well-being.  Sexual activity.  Eating habits.  Work and work Statistician.  Method of birth control.  Menstrual cycle.  Pregnancy history.  Screening You may have the following tests or measurements:  Height, weight, and BMI.  Diabetes screening. This is done by checking your blood sugar (glucose) after you have not eaten for a while (fasting).  Blood pressure.  Lipid and cholesterol levels. These may be checked every 5 years starting at age 66.  Skin check.  Hepatitis C blood test.  Hepatitis B blood test.  Sexually transmitted disease (STD) testing.  BRCA-related cancer screening. This may be done if you have a family history of breast, ovarian, tubal, or peritoneal cancers.  Pelvic exam and Pap test. This may be done every 3 years starting at age 40. Starting at age 59, this may be done every 5  years if you have a Pap test in combination with an HPV test.  Discuss your test results, treatment options, and if necessary, the need for more tests with your health care provider. Vaccines Your health care provider may recommend certain vaccines, such as:  Influenza vaccine. This is recommended every year.  Tetanus, diphtheria, and acellular pertussis (Tdap, Td) vaccine. You may need a Td booster every 10 years.  Varicella vaccine. You may need this if you have not been vaccinated.  HPV vaccine. If you are 69 or younger, you may need three doses over 6 months.  Measles, mumps, and rubella (MMR) vaccine. You may need at least one dose of MMR. You may also need a second dose.  Pneumococcal 13-valent conjugate (PCV13) vaccine. You may need this if you have certain conditions and were not previously vaccinated.  Pneumococcal polysaccharide (PPSV23) vaccine. You may need one or two doses if you smoke cigarettes or if you have certain conditions.  Meningococcal vaccine. One dose is recommended if you are age 27-21 years and a first-year college student living in a residence hall, or if you have one of several medical conditions. You may also need additional booster doses.  Hepatitis A vaccine. You may need this if you have certain conditions or if you travel or work in places where you may be exposed to hepatitis A.  Hepatitis B vaccine. You may need this if you have certain conditions or if you travel or work in places where you may be exposed to hepatitis B.  Haemophilus influenzae type b (Hib) vaccine. You may need this if  you have certain risk factors.  Talk to your health care provider about which screenings and vaccines you need and how often you need them. This information is not intended to replace advice given to you by your health care provider. Make sure you discuss any questions you have with your health care provider. Document Released: 04/14/2001 Document Revised: 11/06/2015  Document Reviewed: 12/18/2014 Elsevier Interactive Patient Education  [employer].

## 2017-08-12 LAB — CYTOLOGY - PAP
Diagnosis: UNDETERMINED — AB
HPV: NOT DETECTED

## 2017-08-13 ENCOUNTER — Other Ambulatory Visit: Payer: Self-pay | Admitting: *Deleted

## 2017-08-13 DIAGNOSIS — R87619 Unspecified abnormal cytological findings in specimens from cervix uteri: Secondary | ICD-10-CM

## 2017-08-18 ENCOUNTER — Other Ambulatory Visit (INDEPENDENT_AMBULATORY_CARE_PROVIDER_SITE_OTHER): Payer: Self-pay | Admitting: Family Medicine

## 2017-08-18 DIAGNOSIS — E559 Vitamin D deficiency, unspecified: Secondary | ICD-10-CM

## 2017-08-19 ENCOUNTER — Ambulatory Visit (INDEPENDENT_AMBULATORY_CARE_PROVIDER_SITE_OTHER): Payer: 59 | Admitting: Family Medicine

## 2017-08-19 ENCOUNTER — Ambulatory Visit (INDEPENDENT_AMBULATORY_CARE_PROVIDER_SITE_OTHER): Payer: 59 | Admitting: Psychology

## 2017-08-26 ENCOUNTER — Ambulatory Visit (INDEPENDENT_AMBULATORY_CARE_PROVIDER_SITE_OTHER): Payer: 59 | Admitting: Family Medicine

## 2017-08-26 VITALS — BP 123/80 | HR 98 | Temp 98.6°F | Ht 65.0 in | Wt 223.0 lb

## 2017-08-26 DIAGNOSIS — Z6837 Body mass index (BMI) 37.0-37.9, adult: Secondary | ICD-10-CM

## 2017-08-26 DIAGNOSIS — R7303 Prediabetes: Secondary | ICD-10-CM

## 2017-08-26 DIAGNOSIS — Z9189 Other specified personal risk factors, not elsewhere classified: Secondary | ICD-10-CM

## 2017-08-26 DIAGNOSIS — E559 Vitamin D deficiency, unspecified: Secondary | ICD-10-CM

## 2017-08-26 MED ORDER — VITAMIN D (ERGOCALCIFEROL) 1.25 MG (50000 UNIT) PO CAPS: 50000.0000 [IU] | ORAL_CAPSULE | ORAL | 0 refills | Status: DC

## 2017-08-26 NOTE — Progress Notes (Signed)
Office: [phone]  /  Fax: [phone]   HPI:   Chief Complaint: OBESITY Bren is here to discuss her progress with her obesity treatment plan. She is on the  keep a food journal with 400 to 500 calories and 30 grams of protein at supper daily and the Category 2 plan and is following her eating plan approximately 30 % of the time. She states she is walking 60 minutes 7 times per week. Eriyonna at one point, felt she was giving up, but she tried to make good food choices when eating off the plan. Her weight is 223 lb (101.2 kg) today and has had a weight loss of 1 pound over a period of 4 weeks since her last visit. She has lost 36 lbs since starting treatment with [name].  Vitamin D deficiency Demetri has a diagnosis of vitamin D deficiency. Dyann is currently taking vit D. She admits fatigue and denies nausea, vomiting or muscle weakness.  At risk for osteopenia and osteoporosis Belvie is at higher risk of osteopenia and osteoporosis due to vitamin D deficiency.   Pre-Diabetes Ronnell has a diagnosis of prediabetes based on her elevated Hgb A1c and was informed this puts her at greater risk of developing diabetes. She is not taking metformin currently and continues to work on diet and exercise to decrease risk of diabetes. She denies nausea or hypoglycemia.  ALLERGIES: No Known Allergies  MEDICATIONS: Current Outpatient Medications on File Prior to Visit  Medication Sig Dispense Refill  . clotrimazole-betamethasone (LOTRISONE) cream Apply 1 application topically 2 (two) times daily. 30 g 0  . cyclobenzaprine (FLEXERIL) 10 MG tablet Take 10 mg by mouth 3 (three) times daily as needed for muscle spasms.    . Polyvinyl Alcohol-Povidone (CLEAR EYES ALL SEASONS) 5-6 MG/ML SOLN Apply 1 drop to eye daily as needed.     No current facility-administered medications on file prior to visit.     PAST MEDICAL HISTORY: Past Medical History:  Diagnosis Date  . Bilateral leg edema     . Chicken pox   . Depression   . Drug use   . Dyspnea   . GERD (gastroesophageal reflux disease)   . HTN (hypertension)   . Low back pain     PAST SURGICAL HISTORY: No past surgical history on file.  SOCIAL HISTORY: Social History   Tobacco Use  . Smoking status: Current Some Day Smoker    Years: 2.00    Types: Cigarettes  . Smokeless tobacco: Never Used  Substance Use Topics  . Alcohol use: Yes    Alcohol/week: 0.0 oz    Comment: social  . Drug use: No    FAMILY HISTORY: Family History  Problem Relation Age of Onset  . Hypertension Mother   . Obesity Mother   . Hypertension Father   . Depression Father   . Sleep apnea Father   . Drug abuse Father   . Obesity Father   . Arthritis Paternal Grandmother   . Cancer Paternal Grandmother        breast  . Alcohol abuse Sister   . Stroke Sister   . Heart disease Maternal Grandmother   . Alcohol abuse Maternal Grandfather     ROS: Review of Systems  Constitutional: Positive for malaise/fatigue and weight loss.  Gastrointestinal: Negative for nausea and vomiting.  Musculoskeletal:       Negative for muscle weakness  Endo/Heme/Allergies:       Negative for hypoglycemia    PHYSICAL EXAM: Blood  pressure 123/80, pulse 98, temperature 98.6 F (37 C), temperature source Oral, height 5\' 5"  (1.651 m), weight 223 lb (101.2 kg), SpO2 98 %. Body mass index is 37.11 kg/m. Physical Exam  Constitutional: She is oriented to person, place, and time. She appears well-developed and well-nourished.  Cardiovascular: Normal rate.  Pulmonary/Chest: Effort normal.  Musculoskeletal: Normal range of motion.  Neurological: She is oriented to person, place, and time.  Skin: Skin is warm and dry.  Psychiatric: She has a normal mood and affect. Her behavior is normal.  Vitals reviewed.   RECENT LABS AND TESTS: BMET    Component Value Date/Time   NA 142 03/17/2017 1042   K 5.0 03/17/2017 1042   CL 106 03/17/2017 1042   CO2 19  (L) 03/17/2017 1042   GLUCOSE 91 03/17/2017 1042   GLUCOSE 81 10/27/2016 1804   BUN 12 03/17/2017 1042   CREATININE 0.76 03/17/2017 1042   CREATININE 0.82 06/08/2014 1627   CALCIUM 9.5 03/17/2017 1042   GFRNONAA 99 03/17/2017 1042   GFRAA 114 03/17/2017 1042   Lab Results  Component Value Date   HGBA1C 5.4 06/24/2017   HGBA1C 5.6 03/17/2017   HGBA1C 5.7 06/11/2015   Lab Results  Component Value Date   INSULIN 13.3 06/24/2017   INSULIN 25.7 (H) 03/17/2017   CBC    Component Value Date/Time   WBC 11.5 (H) 03/17/2017 1042   WBC 6.9 06/11/2015 1024   RBC 4.83 03/17/2017 1042   RBC 4.74 06/11/2015 1024   HGB 14.4 03/17/2017 1042   HCT 43.1 03/17/2017 1042   PLT 289.0 06/11/2015 1024   MCV 89 03/17/2017 1042   MCH 29.8 03/17/2017 1042   MCH 30.1 06/08/2014 1627   MCHC 33.4 03/17/2017 1042   MCHC 33.5 06/11/2015 1024   RDW 13.8 03/17/2017 1042   LYMPHSABS 3.5 (H) 03/17/2017 1042   MONOABS 0.6 06/11/2015 1024   EOSABS 0.2 03/17/2017 1042   BASOSABS 0.0 03/17/2017 1042   Iron/TIBC/Ferritin/ %Sat No results found for: IRON, TIBC, FERRITIN, IRONPCTSAT Lipid Panel     Component Value Date/Time   CHOL 156 03/17/2017 1042   TRIG 74 03/17/2017 1042   HDL 47 03/17/2017 1042   CHOLHDL 4 06/11/2015 1024   VLDL 10.4 06/11/2015 1024   LDLCALC 94 03/17/2017 1042   Hepatic Function Panel     Component Value Date/Time   PROT 7.3 03/17/2017 1042   ALBUMIN 4.6 03/17/2017 1042   AST 16 03/17/2017 1042   ALT 18 03/17/2017 1042   ALKPHOS 89 03/17/2017 1042   BILITOT 0.7 03/17/2017 1042   BILIDIR 0.2 06/11/2015 1024   IBILI 0.6 06/08/2014 1627      Component Value Date/Time   TSH 1.210 03/17/2017 1042   TSH 1.28 06/11/2015 1024   TSH 1.680 06/08/2014 1627   Results for Lesle ChrisDWARDS, Elizebath (MRN [id number]) as of 08/26/2017 09:59  Ref. Range 06/24/2017 08:04  Vitamin D, 25-Hydroxy Latest Ref Range: 30.0 - 100.0 ng/mL 40.4   ASSESSMENT AND PLAN: Vitamin D deficiency - Plan:  Vitamin D, Ergocalciferol, (DRISDOL) 50000 units CAPS capsule  Prediabetes  At risk for osteoporosis  Class 2 severe obesity with serious comorbidity and body mass index (BMI) of 37.0 to 37.9 in adult, unspecified obesity type (HCC)  PLAN:  Vitamin D Deficiency Florinda was informed that low vitamin D levels contributes to fatigue and are associated with obesity, breast, and colon cancer. She agrees to continue to take prescription Vit D @50 ,000 IU every week #4 with no  refills and will follow up for routine testing of vitamin D, at least 2-3 times per year. She was informed of the risk of over-replacement of vitamin D and agrees to not increase her dose unless she discusses this with [name] first. Addisen agrees to follow up as directed.  At risk for osteopenia and osteoporosis Moira is at risk for osteopenia and osteoporosis due to her vitamin D deficiency. She was encouraged to take her vitamin D and follow her higher calcium diet and increase strengthening exercise to help strengthen her bones and decrease her risk of osteopenia and osteoporosis.  Pre-Diabetes Cayle will continue to work on weight loss, exercise, and decreasing simple carbohydrates in her diet to help decrease the risk of diabetes. She was informed that eating too many simple carbohydrates or too many calories at one sitting increases the likelihood of GI side effects. Aurie will continue with the category 2 plan and we will repeat labs in August. Betsy agreed to follow up with [name] as directed to monitor her progress.  Obesity  Diamonds is currently in the action stage of change. As such, her goal is to continue with weight loss efforts She has agreed to keep a food journal with 400 to 500 calories and 30+ grams of protein at supper daily and follow the Category 2 plan Itzelle has been instructed to work up to a goal of 150 minutes of combined cardio and strengthening exercise per week for weight loss and overall  health benefits. We discussed the following Behavioral Modification Strategies today: planning for success, increasing lean protein intake, increasing vegetables and work on meal planning and easy cooking plans  Shenia has agreed to follow up with our clinic in 2 to 3 weeks. She was informed of the importance of frequent follow up visits to maximize her success with intensive lifestyle modifications for her multiple health conditions.   OBESITY BEHAVIORAL INTERVENTION VISIT  Today's visit was # 11 out of 22.  Starting weight: 259 lbs Starting date: 03/17/17 Today's weight : 223 lbs  Today's date: 08/26/2017 Total lbs lost to date: 55 (Patients must lose 7 lbs in the first 6 months to continue with counseling)   ASK: We discussed the diagnosis of obesity with Lesle Chris today and Camilia agreed to give [name] permission to discuss obesity behavioral modification therapy today.  ASSESS: Krupa has the diagnosis of obesity and her BMI today is 37.11 [place] is in the action stage of change   ADVISE: Matalyn was educated on the multiple health risks of obesity as well as the benefit of weight loss to improve her health. She was advised of the need for long term treatment and the importance of lifestyle modifications.  AGREE: Multiple dietary modification options and treatment options were discussed and  Carmie agreed to the above obesity treatment plan.  I, [place] Crane, am acting as transcriptionist for Filbert Schilder, MD  I have reviewed the above documentation for accuracy and completeness, and I agree with the above. - Debbra Riding, MD

## 2017-09-16 ENCOUNTER — Ambulatory Visit (INDEPENDENT_AMBULATORY_CARE_PROVIDER_SITE_OTHER): Payer: Self-pay | Admitting: Family Medicine

## 2018-01-06 ENCOUNTER — Other Ambulatory Visit (INDEPENDENT_AMBULATORY_CARE_PROVIDER_SITE_OTHER): Payer: Self-pay | Admitting: Family Medicine

## 2018-01-06 DIAGNOSIS — E559 Vitamin D deficiency, unspecified: Secondary | ICD-10-CM

## 2018-01-10 NOTE — Telephone Encounter (Signed)
Are you ok with filling vit d.  Was filled by [facility]

## 2018-01-25 ENCOUNTER — Encounter: Payer: Self-pay | Admitting: Family Medicine

## 2018-02-04 NOTE — Telephone Encounter (Signed)
Ms. Jody Bryan, Your FMLA paperwork has been completed as much as possible and forwarded to your provider. Dr. Laury AxonLowne has been out of the office this week and will return on Monday, 02/07/18. There is minimal work left to complete of forms and her signature is needed. I will forward note to provider and MA to make them aware it is in their folders, as I will not be in the office next week. Thank You for choosing [name] ! Best Regards, Dionisio PaschalSharon Love Scates, AAS CMA AAMA

## 2018-02-07 NOTE — Telephone Encounter (Signed)
Was this done-?  I done have it in my folder

## 2018-02-08 ENCOUNTER — Encounter: Payer: Self-pay | Admitting: Family Medicine

## 2018-02-08 ENCOUNTER — Telehealth: Payer: Self-pay | Admitting: *Deleted

## 2018-02-08 NOTE — Telephone Encounter (Signed)
FMLA papers faxed to HR Confirmation received

## 2018-02-08 NOTE — Telephone Encounter (Signed)
FMLA paperwork has been faxed.

## 2018-02-10 NOTE — Telephone Encounter (Signed)
Copied from CRM [id number]. Topic: Quick Communication - See Telephone Encounter >> Feb 10, 2018  5:14 PM Valentina LucksMatos, Jackelin wrote: CRM for notification. See Telephone encounter for: 02/10/18.   Pt dropped off another page of her FMLA paperwork that needed to be filled out by provider ( 2 pages- first page is an example how the 2nd page needs to be filled out) Pt would like document to be faxed to [phone] when document ready. Document put at front office tray under providers name.

## 2018-02-14 ENCOUNTER — Other Ambulatory Visit (INDEPENDENT_AMBULATORY_CARE_PROVIDER_SITE_OTHER): Payer: Self-pay | Admitting: Family Medicine

## 2018-02-14 DIAGNOSIS — E559 Vitamin D deficiency, unspecified: Secondary | ICD-10-CM

## 2018-02-16 NOTE — Telephone Encounter (Signed)
Do you want to continue.  Last vitamin d level was 40.9

## 2018-02-25 ENCOUNTER — Ambulatory Visit (INDEPENDENT_AMBULATORY_CARE_PROVIDER_SITE_OTHER): Payer: 59 | Admitting: Family Medicine

## 2018-02-25 ENCOUNTER — Encounter: Payer: Self-pay | Admitting: Family Medicine

## 2018-02-25 VITALS — BP 134/86 | HR 77 | Ht 65.0 in | Wt 231.0 lb

## 2018-02-25 DIAGNOSIS — M545 Low back pain, unspecified: Secondary | ICD-10-CM

## 2018-02-25 MED ORDER — KETOROLAC TROMETHAMINE 60 MG/2ML IM SOLN
60.0000 mg | Freq: Once | INTRAMUSCULAR | Status: AC
  Administered 2018-02-25: 60 mg via INTRAMUSCULAR

## 2018-02-25 MED ORDER — IBUPROFEN-FAMOTIDINE 800-26.6 MG PO TABS: 1.0000 | ORAL_TABLET | Freq: Three times a day (TID) | ORAL | 3 refills | Status: DC

## 2018-02-25 NOTE — Telephone Encounter (Signed)
Yes, it has already been corrected by provider & refaxed; when I have time today I will try & locate the fax confirmation for the date/SLS 12/27

## 2018-02-25 NOTE — Patient Instructions (Signed)
Nice to meet you  Please star the duexis tomorrow if you have pain. You can take it up to three time daily  Please try heat and massage on the area  Please try the stretches and exercises once the pain has improved  Please follow up with me in 2-3 weeks if no better  Happy new year.

## 2018-02-25 NOTE — Progress Notes (Signed)
Jody ChrisDarvitta Luhmann - 37 y.o. female MRN [id number]  Date of birth: 08/11/1980  SUBJECTIVE:  Including CC & ROS.  No chief complaint on file.   Jody Bryan is a 37 y.o. female that is presenting with left-sided lower back pain.  She has a history of similar pain about a year ago.  She denies any inciting event.  The pain is moderate to severe.  She feels pain when she is making certain movements.  Has some sciatic type symptoms down the left anterior leg.  Has not had any improvement with medications that she has tried to date.  Pain is sharp and stabbing in nature.  She sits at a desk for most of the day.  She does have a standing component to the desk.  Symptoms appear to be staying the same.  Denies any weakness.  Independent review of the lumbar spine x-ray from 08/27/2016 shows no significant degenerative changes.  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  Review of Systems  Constitutional: Negative for fever.  HENT: Negative for congestion.   Respiratory: Negative for cough.   Cardiovascular: Negative for chest pain.  Gastrointestinal: Negative for abdominal pain.  Musculoskeletal: Positive for back pain.  Skin: Negative for color change.  Neurological: Negative for weakness.  Hematological: Negative for adenopathy.  Psychiatric/Behavioral: Negative for agitation.    HISTORY: Past Medical, Surgical, Social, and Family History Reviewed & Updated per EMR.   Pertinent Historical Findings include:  Past Medical History:  Diagnosis Date  . Bilateral leg edema   . Chicken pox   . Depression   . Drug use   . Dyspnea   . GERD (gastroesophageal reflux disease)   . HTN (hypertension)   . Low back pain     No past surgical history on file.  No Known Allergies  Family History  Problem Relation Age of Onset  . Hypertension Mother   . Obesity Mother     . Hypertension Father   . Depression Father   . Sleep apnea Father   . Drug abuse Father   . Obesity Father   . Arthritis Paternal Grandmother   . Cancer Paternal Grandmother        breast  . Alcohol abuse Sister   . Stroke Sister   . Heart disease Maternal Grandmother   . Alcohol abuse Maternal Grandfather      Social History   Socioeconomic History  . Marital status: Single    Spouse name: Not on file  . Number of children: 1  . Years of education: Not on file  . Highest education level: Not on file  Occupational History  . Occupation:  call center  Social Needs  . Financial resource strain: Not on file  . Food insecurity:    Worry: Not on file    Inability: Not on file  . Transportation needs:    Medical: Not on file    Non-medical: Not on file  Tobacco Use  . Smoking status: Current Some Day Smoker    Years: 2.00    Types: Cigarettes  . Smokeless tobacco: Never Used  Substance and Sexual Activity  . Alcohol use: Yes    Alcohol/week: 0.0 standard drinks    Comment: social  . Drug use: No  . Sexual activity: Not on file  Lifestyle  . Physical activity:    Days per week: Not  on file    Minutes per session: Not on file  . Stress: Not on file  Relationships  . Social connections:    Talks on phone: Not on file    Gets together: Not on file    Attends religious service: Not on file    Active member of club or organization: Not on file    Attends meetings of clubs or organizations: Not on file    Relationship status: Not on file  . Intimate partner violence:    Fear of current or ex partner: Not on file    Emotionally abused: Not on file    Physically abused: Not on file    Forced sexual activity: Not on file  Other Topics Concern  . Not on file  Social History Narrative  . Not on file     PHYSICAL EXAM:  VS: BP 134/86   Pulse 77   Ht 5\' 5"  (1.651 m)   Wt 231 lb (104.8 kg)   SpO2 98%   BMI 38.44 kg/m  Physical Exam Gen: NAD, alert,  cooperative with exam, well-appearing ENT: normal lips, normal nasal mucosa,  Eye: normal EOM, normal conjunctiva and lids CV:  no edema, +2 pedal pulses   Resp: no accessory muscle use, non-labored,  Skin: no rashes, no areas of induration  Neuro: normal tone, normal sensation to touch Psych:  normal insight, alert and oriented MSK:  Back/left hip:  Some tenderness to palpation over the left lower paraspinal muscles. No tenderness to palpation of the lower midline lumbar spine. Normal internal and external rotation of the left hip. Normal strength resistance with hip flexion, knee flexion extension, plantarflexion and dorsiflexion. Negative straight leg raise bilaterally. Normal gait. Neurovascular intact     ASSESSMENT & PLAN:   Low back pain Pain seems to be spasm related.  Seems less likely for nerve impingement at this time.  Has had similar symptoms in the past - IM Toradol -Provided Duexis.  She does not want to try prednisone and does not feel like the muscle relaxer helps. -Counseled on supportive care and home exercise therapy. -If no improvement can consider trigger point injections or physical therapy.

## 2018-02-25 NOTE — Telephone Encounter (Signed)
Didn't we resend this already?

## 2018-02-25 NOTE — Telephone Encounter (Addendum)
Relation to pt: self  Call back number: [phone]   Reason for call:  Patient checking on the status of  FMLA paperwork, stating it wasn't completed at the time it was faxed, page 2 # 7.  Due date is 02/26/18, please advise patient as soon as possible

## 2018-02-25 NOTE — Assessment & Plan Note (Signed)
Pain seems to be spasm related.  Seems less likely for nerve impingement at this time.  Has had similar symptoms in the past - IM Toradol -Provided Duexis.  She does not want to try prednisone and does not feel like the muscle relaxer helps. -Counseled on supportive care and home exercise therapy. -If no improvement can consider trigger point injections or physical therapy.

## 2018-02-28 NOTE — Telephone Encounter (Signed)
Patient notified that we had already resent paper work the day we talked with her on the phone.  She had not checked with HR yet.

## 2018-03-15 ENCOUNTER — Telehealth: Payer: Self-pay | Admitting: Family Medicine

## 2018-03-15 NOTE — Telephone Encounter (Signed)
Copied from CRM [id number]. Topic: Quick Communication - Rx Refill/Question >> Mar 15, 2018 11:19 AM Wyonia Hough E wrote: Medication: Ibuprofen-Famotidine 800-26.6 MG TABS ( Needs PA) from Dr. Clare Gandy  Has the patient contacted their pharmacy? Yes [facility] called and this medication needs a PA)   Preferred Pharmacy (with phone number or street name): [id number], [place], [place] - [address] [phone] (Phone) [phone] (Fax)    Agent: Please be advised that RX refills may take up to 3 business days. We ask that you follow-up with your pharmacy.

## 2018-03-17 NOTE — Telephone Encounter (Signed)
PA initiated : key : [id number]

## 2018-03-25 NOTE — Telephone Encounter (Signed)
Patient has received her medication

## 2018-08-22 ENCOUNTER — Encounter: Payer: Self-pay | Admitting: Family Medicine

## 2018-08-22 ENCOUNTER — Ambulatory Visit (INDEPENDENT_AMBULATORY_CARE_PROVIDER_SITE_OTHER): Payer: 59 | Admitting: Family Medicine

## 2018-08-22 ENCOUNTER — Ambulatory Visit (HOSPITAL_BASED_OUTPATIENT_CLINIC_OR_DEPARTMENT_OTHER)
Admission: RE | Admit: 2018-08-22 | Discharge: 2018-08-22 | Disposition: A | Discharge status: Home / Self Care | Payer: 59 | Source: Ambulatory Visit | Attending: Family Medicine | Admitting: Family Medicine

## 2018-08-22 ENCOUNTER — Other Ambulatory Visit: Payer: Self-pay

## 2018-08-22 VITALS — BP 132/87 | HR 81 | Temp 99.2°F | Resp 16 | Ht 65.0 in | Wt 226.4 lb

## 2018-08-22 DIAGNOSIS — R0602 Shortness of breath: Secondary | ICD-10-CM | POA: Diagnosis not present

## 2018-08-22 DIAGNOSIS — R079 Chest pain, unspecified: Secondary | ICD-10-CM | POA: Diagnosis not present

## 2018-08-22 DIAGNOSIS — M47814 Spondylosis without myelopathy or radiculopathy, thoracic region: Secondary | ICD-10-CM | POA: Diagnosis not present

## 2018-08-22 DIAGNOSIS — M546 Pain in thoracic spine: Secondary | ICD-10-CM

## 2018-08-22 DIAGNOSIS — M4184 Other forms of scoliosis, thoracic region: Secondary | ICD-10-CM | POA: Diagnosis not present

## 2018-08-22 NOTE — Progress Notes (Signed)
Patient ID: Jody Bryan, female    DOB: 02/16/1981  Age: 38 y.o. MRN: [id number]    Subjective:  Subjective  HPI Jody Bryan presents for upper back pain.  No known injury.   She is concerned about her lungs-- she getting sob at times as well .  No cough No fever   Review of Systems  Constitutional: Negative for appetite change, diaphoresis, fatigue and unexpected weight change.  Eyes: Negative for pain, redness and visual disturbance.  Respiratory: Positive for shortness of breath. Negative for cough, chest tightness and wheezing.   Cardiovascular: Negative for chest pain, palpitations and leg swelling.  Endocrine: Negative for cold intolerance, heat intolerance, polydipsia, polyphagia and polyuria.  Genitourinary: Negative for difficulty urinating, dysuria and frequency.  Musculoskeletal: Positive for arthralgias and back pain.  Neurological: Negative for dizziness, light-headedness, numbness and headaches.    History Past Medical History:  Diagnosis Date  . Bilateral leg edema   . Chicken pox   . Depression   . Drug use   . Dyspnea   . GERD (gastroesophageal reflux disease)   . HTN (hypertension)   . Low back pain     She has no past surgical history on file.   Her family history includes Alcohol abuse in her maternal grandfather and sister; Arthritis in her paternal grandmother; Cancer in her paternal grandmother; Depression in her father; Drug abuse in her father; Heart disease in her maternal grandmother; Hypertension in her father and mother; Obesity in her father and mother; Sleep apnea in her father; Stroke in her sister.She reports that she has been smoking cigarettes. She has smoked for the past 2.00 years. She has never used smokeless tobacco. She reports current alcohol use. She reports that she does not use drugs.  Current Outpatient Medications on File Prior to Visit  Medication Sig Dispense Refill  . clotrimazole-betamethasone (LOTRISONE) cream Apply 1  application topically 2 (two) times daily. 30 g 0  . cyclobenzaprine (FLEXERIL) 10 MG tablet Take 10 mg by mouth 3 (three) times daily as needed for muscle spasms.    . Ibuprofen-Famotidine 800-26.6 MG TABS Take 1 tablet by mouth 3 (three) times daily. 90 tablet 3  . Polyvinyl Alcohol-Povidone (CLEAR EYES ALL SEASONS) 5-6 MG/ML SOLN Apply 1 drop to eye daily as needed.    . Vitamin D, Ergocalciferol, (DRISDOL) 1.25 MG (50000 UT) CAPS capsule TAKE 1 CAPSULE BY MOUTH EVERY 7 DAYS 4 capsule 0   No current facility-administered medications on file prior to visit.      Objective:  Objective  Physical Exam Vitals signs and nursing note reviewed.  Constitutional:      Appearance: She is well-developed.  HENT:     Head: Normocephalic and atraumatic.  Eyes:     Conjunctiva/sclera: Conjunctivae normal.  Neck:     Musculoskeletal: Normal range of motion and neck supple.     Thyroid: No thyromegaly.     Vascular: No carotid bruit or JVD.  Cardiovascular:     Rate and Rhythm: Normal rate and regular rhythm.     Heart sounds: Normal heart sounds. No murmur.  Pulmonary:     Effort: Pulmonary effort is normal. No respiratory distress.     Breath sounds: Normal breath sounds. No wheezing or rales.  Chest:     Chest wall: No tenderness.  Musculoskeletal:        General: Tenderness present.     Thoracic back: She exhibits tenderness, pain and spasm.       Back:  Arms:  Neurological:     Mental Status: She is alert and oriented to person, place, and time.    BP 132/87 (BP Location: Left Arm, Patient Position: Sitting, Cuff Size: Normal)   Pulse 81   Temp 99.2 F (37.3 C) (Oral)   Resp 16   Ht 5\' 5"  (1.651 m)   Wt 226 lb 6 oz (102.7 kg)   SpO2 97%   BMI 37.67 kg/m  Wt Readings from Last 3 Encounters:  08/22/18 226 lb 6 oz (102.7 kg)  02/25/18 231 lb (104.8 kg)  08/26/17 223 lb (101.2 kg)     Lab Results  Component Value Date   WBC 11.5 (H) 03/17/2017   HGB 14.4  03/17/2017   HCT 43.1 03/17/2017   PLT 289.0 06/11/2015   GLUCOSE 91 03/17/2017   CHOL 156 03/17/2017   TRIG 74 03/17/2017   HDL 47 03/17/2017   LDLCALC 94 03/17/2017   ALT 18 03/17/2017   AST 16 03/17/2017   NA 142 03/17/2017   K 5.0 03/17/2017   CL 106 03/17/2017   CREATININE 0.76 03/17/2017   BUN 12 03/17/2017   CO2 19 (L) 03/17/2017   TSH 1.210 03/17/2017   HGBA1C 5.4 06/24/2017    No results found.   Assessment & Plan:  Plan  I am having Jody Bryan maintain her clotrimazole-betamethasone, Polyvinyl Alcohol-Povidone, cyclobenzaprine, Vitamin D (Ergocalciferol), and Ibuprofen-Famotidine.  No orders of the defined types were placed in this encounter.   Problem List Items Addressed This Visit    None    Visit Diagnoses    Thoracic spine pain    -  Primary   Relevant Orders   DG Thoracic Spine 2 View   DG Chest 2 View   SOB (shortness of breath)       Relevant Orders   DG Thoracic Spine 2 View   DG Chest 2 View    xrays con't tylenol / IB Pt did not want a muscle relaxer  Pt will try chiropractor as well  Follow-up: No follow-ups on file.  [name], DO

## 2018-08-22 NOTE — Patient Instructions (Signed)
Acute Pain, Adult °Acute pain is a type of pain that may last for just a few days or as long as six months. It is often related to an illness, injury, or medical procedure. Acute pain may be mild, moderate, or severe. It usually goes away once your injury has healed or you are no longer ill. °Pain can make it hard for you to do daily activities. It can cause anxiety and lead to other problems if left untreated. Treatment depends on the cause and severity of your acute pain. °Follow these instructions at home: °· Check your pain level as told by your health care provider. °· Take over-the-counter and prescription medicines only as told by your health care provider. °· If you are taking prescription pain medicine: °? Ask your health care provider about taking a stool softener or laxative to prevent constipation. °? Do not stop taking the medicine suddenly. Talk to your health care provider about how and when to discontinue prescription pain medicine. °? If your pain is severe, do not take more pills than instructed by your health care provider. °? Do not take other over-the-counter pain medicines in addition to this medicine unless told by your health care provider. °? Do not drive or operate heavy machinery while taking prescription pain medicine. °· Apply ice or heat as told by your health care provider. These may reduce swelling and pain. °· Ask your health care provider if other strategies such as distraction, relaxation, or physical therapies can help your pain. °· Keep all follow-up visits as told by your health care provider. This is important. °Contact a health care provider if: °· You have pain that is not controlled by medicine. °· Your pain does not improve or gets worse. °· You have side effects from pain medicines, such as vomiting or confusion. °Get help right away if: °· You have severe pain. °· You have trouble breathing. °· You lose consciousness. °· You have chest pain or pressure that lasts for more  than a few minutes. Along with the chest pain you may: °? Have pain or discomfort in one or both arms, your back, neck, jaw, or stomach. °? Have shortness of breath. °? Break out in a cold sweat. °? Feel nauseous. °? Become light-headed. °These symptoms may represent a serious problem that is an emergency. Do not wait to see if the symptoms will go away. Get medical help right away. Call your local emergency services (911 in the [place] Do not drive yourself to the hospital. °This information is not intended to replace advice given to you by your health care provider. Make sure you discuss any questions you have with your health care provider. °Document Released: 03/03/2015 Document Revised: 07/26/2015 Document Reviewed: 03/03/2015 °Elsevier Interactive Patient Education © 2019 Elsevier Inc. ° °

## 2018-09-12 ENCOUNTER — Encounter: Payer: Self-pay | Admitting: Family Medicine

## 2018-09-12 ENCOUNTER — Telehealth: Payer: Self-pay

## 2018-09-12 NOTE — Telephone Encounter (Signed)
Copied from [place] #[id number]. Topic: General - Other >> Sep 12, 2018  9:24 AM Celene Kras A wrote: Reason for CRM: Pt called and is requesting to have a weight loss pill prescribed for her without having to make another appt. Pt states she has seen PCP for this issue. Please advise.

## 2018-09-12 NOTE — Telephone Encounter (Signed)
Needs ov to discuss

## 2018-09-14 ENCOUNTER — Encounter: Payer: Self-pay | Admitting: Family Medicine

## 2018-09-14 ENCOUNTER — Other Ambulatory Visit: Payer: Self-pay

## 2018-09-14 ENCOUNTER — Telehealth: Payer: Self-pay

## 2018-09-14 ENCOUNTER — Ambulatory Visit (INDEPENDENT_AMBULATORY_CARE_PROVIDER_SITE_OTHER): Payer: 59 | Admitting: Family Medicine

## 2018-09-14 MED ORDER — SAXENDA 18 MG/3ML ~~LOC~~ SOPN: 3.0000 mg | PEN_INJECTOR | Freq: Every day | SUBCUTANEOUS | 5 refills | Status: DC

## 2018-09-14 NOTE — Progress Notes (Signed)
Virtual Visit via Video Note  I connected with Vern Claude on 09/14/18 at  9:00 AM EDT by a video enabled telemedicine application and verified that I am speaking with the correct person using two identifiers.  Location: Patient: home  Provider: home   I discussed the limitations of evaluation and management by telemedicine and the availability of in person appointments. The patient expressed understanding and agreed to proceed.  History of Present Illness: Pt is frustrated about not being able to loose weight.  She tried healthy weight and wellness with no success    Observations/Objective: No vitals obtained  Assessment and Plan: 1. Morbid obesity ([place]) Pt will come in to get a sample before filling it F/u 3 months or sooner prn  - Liraglutide -Weight Management (SAXENDA) 18 MG/3ML SOPN; Inject 3 mg into the skin daily.  Dispense: 3 mL; Refill: 5  Follow Up Instructions:    I discussed the assessment and treatment plan with the patient. The patient was provided an opportunity to ask questions and all were answered. The patient agreed with the plan and demonstrated an understanding of the instructions.   The patient was advised to call back or seek an in-person evaluation if the symptoms worsen or if the condition fails to improve as anticipated.  I provided 20 minutes of non-face-to-face time during this encounter.   [name], DO

## 2018-09-14 NOTE — Telephone Encounter (Signed)
PA approved. Effective 09/14/2018 to 01/12/2019.

## 2018-09-14 NOTE — Telephone Encounter (Signed)
PA initiated via Covermymeds; KEY: [id number]. Awaiting determination.

## 2018-09-15 ENCOUNTER — Encounter: Payer: Self-pay | Admitting: Family Medicine

## 2018-09-15 ENCOUNTER — Telehealth: Payer: Self-pay

## 2018-09-15 ENCOUNTER — Other Ambulatory Visit: Payer: Self-pay

## 2018-09-15 ENCOUNTER — Ambulatory Visit: Payer: 59

## 2018-09-15 NOTE — Telephone Encounter (Signed)
Please advise. Pt states concern regarding side effects

## 2018-09-15 NOTE — Telephone Encounter (Signed)
Only other option is Contrave  I do not like to use phenteramine due to side effects  Esp on the heart .

## 2018-09-15 NOTE — Telephone Encounter (Signed)
Copied from [place] [id number]. Topic: General - Inquiry >> Sep 15, 2018  9:47 AM Jody Bryan, NT wrote: Patient is wanting an alternative medication instead of her Gilmer Mor. Call back is [phone].

## 2018-09-16 ENCOUNTER — Ambulatory Visit: Payer: 59

## 2018-09-28 ENCOUNTER — Encounter: Payer: Self-pay | Admitting: Family Medicine

## 2018-10-12 ENCOUNTER — Encounter: Payer: Self-pay | Admitting: Family Medicine

## 2018-10-20 ENCOUNTER — Encounter: Payer: Self-pay | Admitting: Family Medicine

## 2018-11-10 NOTE — Telephone Encounter (Signed)
Pt called back and is frustrated about this Questionnaire, still has not heard back, FU at [phone]

## 2018-12-14 ENCOUNTER — Other Ambulatory Visit: Payer: Self-pay | Admitting: *Deleted

## 2018-12-14 MED ORDER — BD PEN NEEDLE NANO U/F 32G X 4 MM MISC: 1 refills | Status: DC

## 2019-04-03 ENCOUNTER — Ambulatory Visit (INDEPENDENT_AMBULATORY_CARE_PROVIDER_SITE_OTHER): Payer: 59 | Admitting: Family Medicine

## 2019-04-03 ENCOUNTER — Encounter: Payer: Self-pay | Admitting: Family Medicine

## 2019-04-03 ENCOUNTER — Other Ambulatory Visit: Payer: Self-pay

## 2019-04-03 VITALS — Temp 98.1°F | Wt 225.0 lb

## 2019-04-03 DIAGNOSIS — Z3169 Encounter for other general counseling and advice on procreation: Secondary | ICD-10-CM | POA: Diagnosis not present

## 2019-04-03 NOTE — Progress Notes (Signed)
Virtual Visit via Video Note  I connected with Jody Bryan on 04/03/19 at  3:20 PM EST by a video enabled telemedicine application and verified that I am speaking with the correct person using two identifiers.  Location: Patient: home  Provider: office    I discussed the limitations of evaluation and management by telemedicine and the availability of in person appointments. The patient expressed understanding and agreed to proceed.  History of Present Illness: Pt has been trying to get pregnant and has not been able to.  They have been trying for 4 months  She still has regular period but her last one came early and stopped after only few days and then started again afew days later    Observations/Objective: Vitals:   04/03/19 1459  Temp: 98.1 F (36.7 C)   Pt is in NAD  Assessment and Plan: 1. Infertility counseling Pt will con't to use ovulation kits and then will refer to ob / gyn  - Ambulatory referral to Obstetrics / Gynecology  Follow Up Instructions:    I discussed the assessment and treatment plan with the patient. The patient was provided an opportunity to ask questions and all were answered. The patient agreed with the plan and demonstrated an understanding of the instructions.   The patient was advised to call back or seek an in-person evaluation if the symptoms worsen or if the condition fails to improve as anticipated.  I provided 15 minutes of non-face-to-face time during this encounter.   Donato Schultz, DO

## 2019-04-17 ENCOUNTER — Encounter: Payer: Self-pay | Admitting: Family Medicine

## 2019-04-17 NOTE — Telephone Encounter (Signed)
Needs ov--- virtual is fine or in person

## 2019-04-19 ENCOUNTER — Other Ambulatory Visit: Payer: Self-pay

## 2019-04-20 ENCOUNTER — Ambulatory Visit (INDEPENDENT_AMBULATORY_CARE_PROVIDER_SITE_OTHER): Payer: 59 | Admitting: Family Medicine

## 2019-04-20 ENCOUNTER — Encounter: Payer: Self-pay | Admitting: Family Medicine

## 2019-04-20 DIAGNOSIS — M545 Low back pain, unspecified: Secondary | ICD-10-CM

## 2019-04-20 MED ORDER — CYCLOBENZAPRINE HCL 10 MG PO TABS: 10.0000 mg | ORAL_TABLET | Freq: Three times a day (TID) | ORAL | 0 refills | Status: DC | PRN

## 2019-04-20 MED ORDER — TRAMADOL HCL 50 MG PO TABS: 50.0000 mg | ORAL_TABLET | Freq: Three times a day (TID) | ORAL | 0 refills | Status: AC | PRN

## 2019-04-20 NOTE — Progress Notes (Signed)
Virtual Visit via Video Note  I connected with Jody Bryan on 04/20/19 at  1:00 PM EST by a video enabled telemedicine application and verified that I am speaking with the correct person using two identifiers.  Location: Patient: home alone  Provider: home    I discussed the limitations of evaluation and management by telemedicine and the availability of in person appointments. The patient expressed understanding and agreed to proceed.  History of Present Illness: Pt is home c/o  l hip pain that radiates down L leg to thigh.  No known injury She took some tramadol she had left over and it did help some.   Observations/Objective: There were no vitals filed for this visit. Pt unable to walk on heels with L side--- foot drops Difficulty getting up from sitting  Assessment and Plan: 1. Low back pain with radiation Alt ice and heat  Rest  Tramadol and flexeril--- pt will check preg test first before taking meds  F/u next week in office if no better  - traMADol (ULTRAM) 50 MG tablet; Take 1 tablet (50 mg total) by mouth every 8 (eight) hours as needed for up to 5 days.  Dispense: 15 tablet; Refill: 0 - cyclobenzaprine (FLEXERIL) 10 MG tablet; Take 1 tablet (10 mg total) by mouth 3 (three) times daily as needed for muscle spasms.  Dispense: 30 tablet; Refill: 0   Follow Up Instructions:    I discussed the assessment and treatment plan with the patient. The patient was provided an opportunity to ask questions and all were answered. The patient agreed with the plan and demonstrated an understanding of the instructions.   The patient was advised to call back or seek an in-person evaluation if the symptoms worsen or if the condition fails to improve as anticipated.  I provided 30 minutes of non-face-to-face time during this encounter.   Donato Schultz, DO

## 2019-04-25 ENCOUNTER — Telehealth: Payer: Self-pay | Admitting: Family Medicine

## 2019-04-25 NOTE — Telephone Encounter (Signed)
Patient dropped off two documents to be filled out, By Dr Laury Axon for [facility] [employer] to return to work. Patient would like documents to be faxed to [phone]. Patient would like to be notified via phone call [phone] when documents are sent off.  Forms dropped off in Dr. Laury Axon box .DThomas

## 2019-04-26 DIAGNOSIS — Z3009 Encounter for other general counseling and advice on contraception: Secondary | ICD-10-CM | POA: Diagnosis not present

## 2019-04-26 NOTE — Telephone Encounter (Signed)
Forms received.  

## 2019-04-27 ENCOUNTER — Telehealth: Payer: Self-pay

## 2019-04-27 NOTE — Telephone Encounter (Signed)
Pt called. Left VM. Advised patient to call back to set up virtual for Tuesday to go over [facility] paperwork with [place].

## 2019-05-01 DIAGNOSIS — Z3169 Encounter for other general counseling and advice on procreation: Secondary | ICD-10-CM | POA: Diagnosis not present

## 2019-05-02 ENCOUNTER — Other Ambulatory Visit: Payer: Self-pay

## 2019-05-02 ENCOUNTER — Encounter: Payer: Self-pay | Admitting: Family Medicine

## 2019-05-02 ENCOUNTER — Ambulatory Visit (INDEPENDENT_AMBULATORY_CARE_PROVIDER_SITE_OTHER): Payer: 59 | Admitting: Family Medicine

## 2019-05-02 DIAGNOSIS — M545 Low back pain: Secondary | ICD-10-CM | POA: Diagnosis not present

## 2019-05-02 DIAGNOSIS — G8929 Other chronic pain: Secondary | ICD-10-CM

## 2019-05-02 NOTE — Progress Notes (Signed)
Virtual Visit via Video Note  I connected with Jody Bryan on 05/02/19 at  3:40 PM EST by a video enabled telemedicine application and verified that I am speaking with the correct person using two identifiers.  Location: Patient: home alone  Provider: office    I discussed the limitations of evaluation and management by telemedicine and the availability of in person appointments. The patient expressed understanding and agreed to proceed.  History of Present Illness: Pt needs fmla paperwork filled out ---- for periodic back pain / spasms.  She was out 2/18-2/24  She still uses the sit to stand desk and can not lift anything heavy but is able to do her job   Past Medical History:  Diagnosis Date  . Bilateral leg edema   . Chicken pox   . Depression   . Drug use   . Dyspnea   . GERD (gastroesophageal reflux disease)   . HTN (hypertension)   . Low back pain    Current Outpatient Medications on File Prior to Visit  Medication Sig Dispense Refill  . cyclobenzaprine (FLEXERIL) 10 MG tablet Take 1 tablet (10 mg total) by mouth 3 (three) times daily as needed for muscle spasms. (Patient not taking: Reported on 05/02/2019) 30 tablet 0   No current facility-administered medications on file prior to visit.   Current Outpatient Medications on File Prior to Visit  Medication Sig Dispense Refill  . cyclobenzaprine (FLEXERIL) 10 MG tablet Take 1 tablet (10 mg total) by mouth 3 (three) times daily as needed for muscle spasms. (Patient not taking: Reported on 05/02/2019) 30 tablet 0   No current facility-administered medications on file prior to visit.   Family History  Problem Relation Age of Onset  . Hypertension Mother   . Obesity Mother   . Hypertension Father   . Depression Father   . Sleep apnea Father   . Drug abuse Father   . Obesity Father   . Arthritis Paternal Grandmother   . Cancer Paternal Grandmother        breast  . Alcohol abuse Sister   . Stroke Sister   . Heart  disease Maternal Grandmother   . Alcohol abuse Maternal Grandfather    Observations/Objective: No vitals obtained  Pt is in nad  Assessment and Plan: 1. Chronic midline low back pain without sciatica fmla paperwork filled out Pt is back at work  F/u prn     Follow Up Instructions:    I discussed the assessment and treatment plan with the patient. The patient was provided an opportunity to ask questions and all were answered. The patient agreed with the plan and demonstrated an understanding of the instructions.   The patient was advised to call back or seek an in-person evaluation if the symptoms worsen or if the condition fails to improve as anticipated.  I provided 25 minutes of non-face-to-face time during this encounter.   Donato Schultz, DO

## 2019-05-02 NOTE — Assessment & Plan Note (Signed)
fmla paperwork filled out Pt is back at work  F/u prn

## 2019-05-10 ENCOUNTER — Encounter: Payer: Self-pay | Admitting: Family Medicine

## 2019-06-15 ENCOUNTER — Telehealth: Payer: Self-pay | Admitting: Family Medicine

## 2019-06-15 NOTE — Telephone Encounter (Signed)
Pt dropped off document to be filled out by provider (FMLA paperwork- 2 pages) Pt would like document to be faxed when ready to [phone]. Pt mentioned is needing document before 06-22-2019 to be faxed out, if not to please inform pt with time so she can ask for an extention. Document put at front office tray under providers name.

## 2019-06-15 NOTE — Telephone Encounter (Signed)
Received. Placed on my desk

## 2019-07-19 ENCOUNTER — Telehealth: Payer: Self-pay | Admitting: Family Medicine

## 2019-07-19 NOTE — Telephone Encounter (Signed)
Caller : U[place]  Call Back # [phone]  Patient states that date are wrong FLMA paper work.   Needs to states 2022 for year

## 2019-07-19 NOTE — Telephone Encounter (Signed)
Spoke with patient. Pt states wanting the return date year to be changed to 2022 due to [facility] paperwork being intermittently. Pt states she was told if we change the date we will not have to fill out the forms for a year but your office note states she returned on 04/26/2019. Please advise

## 2019-07-20 ENCOUNTER — Encounter: Payer: Self-pay | Admitting: Family Medicine

## 2019-07-20 NOTE — Telephone Encounter (Signed)
That is what she told me during visit ---I need to see papers

## 2019-07-24 NOTE — Telephone Encounter (Signed)
FMLA paperwork faxed with corrections and placed in basket

## 2019-07-24 NOTE — Telephone Encounter (Signed)
Form printed and placed in your office.

## 2019-07-24 NOTE — Telephone Encounter (Signed)
done

## 2019-08-01 HISTORY — PX: WISDOM TOOTH EXTRACTION: SHX21

## 2019-08-08 ENCOUNTER — Telehealth: Payer: Self-pay | Admitting: Family Medicine

## 2019-08-08 NOTE — Telephone Encounter (Signed)
Patient came into office regarding Fmla paper work that was filled out in may 24th 2021 . She needs the form to be filled out correctly , any questions , please call patient . [phone] Patient also dropped off form as well , which was left in the basket.

## 2019-08-08 NOTE — Telephone Encounter (Signed)
Per  Patient, she would like CMA to resubmit FMLA Paperwork . Patient state section 4 needs to state continually.   Fax # [phone]

## 2019-08-09 NOTE — Telephone Encounter (Signed)
Spoke with [employer] and they will be sending new copies of FMLA paperwork

## 2019-09-26 ENCOUNTER — Telehealth: Payer: Self-pay | Admitting: Family Medicine

## 2019-09-26 NOTE — Telephone Encounter (Signed)
Patient called requesting orders put in for Colonoscopy, Bone Density Testing and Mammogram . Patient would also CPT codes sent to her mychart   Please Advise

## 2019-09-27 ENCOUNTER — Other Ambulatory Visit: Payer: Self-pay

## 2019-09-27 NOTE — Telephone Encounter (Signed)
Communication via mychart. Please see encounter.

## 2019-10-10 ENCOUNTER — Ambulatory Visit (INDEPENDENT_AMBULATORY_CARE_PROVIDER_SITE_OTHER): Payer: No Typology Code available for payment source | Admitting: Family Medicine

## 2019-10-10 ENCOUNTER — Other Ambulatory Visit (HOSPITAL_BASED_OUTPATIENT_CLINIC_OR_DEPARTMENT_OTHER): Payer: Self-pay | Admitting: Family Medicine

## 2019-10-10 ENCOUNTER — Other Ambulatory Visit: Payer: Self-pay

## 2019-10-10 ENCOUNTER — Encounter: Payer: Self-pay | Admitting: Family Medicine

## 2019-10-10 VITALS — BP 130/82 | HR 86 | Temp 99.7°F | Resp 18 | Ht 65.0 in | Wt 223.8 lb

## 2019-10-10 DIAGNOSIS — Z0000 Encounter for general adult medical examination without abnormal findings: Secondary | ICD-10-CM | POA: Diagnosis not present

## 2019-10-10 DIAGNOSIS — Z79899 Other long term (current) drug therapy: Secondary | ICD-10-CM | POA: Diagnosis not present

## 2019-10-10 DIAGNOSIS — Z1231 Encounter for screening mammogram for malignant neoplasm of breast: Secondary | ICD-10-CM

## 2019-10-10 NOTE — Progress Notes (Signed)
Subjective:     Jody Bryan is a 39 y.o. female and is here for a comprehensive physical exam. The patient reports no problems.  Social History   Socioeconomic History  . Marital status: Single    Spouse name: Not on file  . Number of children: 1  . Years of education: Not on file  . Highest education level: Not on file  Occupational History  . Occupation:  call center  Tobacco Use  . Smoking status: Current Some Day Smoker    Years: 2.00    Types: Cigarettes  . Smokeless tobacco: Never Used  Substance and Sexual Activity  . Alcohol use: Yes    Alcohol/week: 0.0 standard drinks    Comment: social  . Drug use: No  . Sexual activity: Not on file  Other Topics Concern  . Not on file  Social History Narrative  . Not on file   Social Determinants of Health   Financial Resource Strain:   . Difficulty of Paying Living Expenses:   Food Insecurity:   . Worried About Programme researcher, broadcasting/film/video in the Last Year:   . Barista in the Last Year:   Transportation Needs:   . Freight forwarder (Medical):   Marland Kitchen Lack of Transportation (Non-Medical):   Physical Activity:   . Days of Exercise per Week:   . Minutes of Exercise per Session:   Stress:   . Feeling of Stress :   Social Connections:   . Frequency of Communication with Friends and Family:   . Frequency of Social Gatherings with Friends and Family:   . Attends Religious Services:   . Active Member of Clubs or Organizations:   . Attends Banker Meetings:   Marland Kitchen Marital Status:   Intimate Partner Violence:   . Fear of Current or Ex-Partner:   . Emotionally Abused:   Marland Kitchen Physically Abused:   . Sexually Abused:    Health Maintenance  Topic Date Due  . Hepatitis C Screening  Never done  . INFLUENZA VACCINE  10/01/2019  . TETANUS/TDAP  07/31/2020  . PAP SMEAR-Modifier  08/09/2020  . COVID-19 Vaccine  Completed  . HIV Screening  Completed    The following portions of the patient's history were reviewed  and updated as appropriate:  She  has a past medical history of Bilateral leg edema, Chicken pox, Depression, Drug use, Dyspnea, GERD (gastroesophageal reflux disease), HTN (hypertension), and Low back pain. She does not have any pertinent problems on file. She  has no past surgical history on file. Her family history includes Alcohol abuse in her maternal grandfather and sister; Arthritis in her paternal grandmother; Cancer in her paternal grandmother; Depression in her father; Drug abuse in her father; Heart disease in her maternal grandmother; Hypertension in her father and mother; Obesity in her father and mother; Sleep apnea in her father; Stroke in her sister. She  reports that she has been smoking cigarettes. She has smoked for the past 2.00 years. She has never used smokeless tobacco. She reports current alcohol use. She reports that she does not use drugs. She currently has no medications in their medication list. No current outpatient medications on file prior to visit.   No current facility-administered medications on file prior to visit.   She has No Known Allergies..  Review of Systems Review of Systems  Constitutional: Negative for activity change, appetite change and fatigue.  HENT: Negative for hearing loss, congestion, tinnitus and ear discharge.  dentist  q36m Eyes: Negative for visual disturbance  Respiratory: Negative for cough, chest tightness and shortness of breath.   Cardiovascular: Negative for chest pain, palpitations and leg swelling.  Gastrointestinal: Negative for abdominal pain, diarrhea, constipation and abdominal distention.  Genitourinary: Negative for urgency, frequency, decreased urine volume and difficulty urinating.  Musculoskeletal: Negative for back pain, arthralgias and gait problem.  Skin: Negative for color change, pallor and rash.  Neurological: Negative for dizziness, light-headedness, numbness and headaches.  Hematological: Negative for adenopathy.  Does not bruise/bleed easily.  Psychiatric/Behavioral: Negative for suicidal ideas, confusion, sleep disturbance, self-injury, dysphoric mood, decreased concentration and agitation.       Objective:    BP 130/82 (BP Location: Left Arm, Patient Position: Sitting, Cuff Size: Large)   Pulse 86   Temp 99.7 F (37.6 C) (Oral)   Resp 18   Ht 5\' 5"  (1.651 m)   Wt 223 lb 12.8 oz (101.5 kg)   SpO2 98%   BMI 37.24 kg/m  General appearance: alert, cooperative, appears stated age and no distress Head: Normocephalic, without obvious abnormality, atraumatic Eyes: negative findings: lids and lashes normal, conjunctivae and sclerae normal and pupils equal, round, reactive to light and accomodation Ears: normal TM's and external ear canals both ears Neck: no adenopathy, no carotid bruit, no JVD, supple, symmetrical, trachea midline and thyroid not enlarged, symmetric, no tenderness/mass/nodules Back: symmetric, no curvature. ROM normal. No CVA tenderness. Lungs: clear to auscultation bilaterally Breasts: normal appearance, no masses or tenderness Heart: regular rate and rhythm, S1, S2 normal, no murmur, click, rub or gallop Abdomen: soft, non-tender; bowel sounds normal; no masses,  no organomegaly Pelvic: deferred-- Extremities: extremities normal, atraumatic, no cyanosis or edema Pulses: 2+ and symmetric Skin: Skin color, texture, turgor normal. No rashes or lesions Lymph nodes: Cervical, supraclavicular, and axillary nodes normal. Neurologic: Alert and oriented X 3, normal strength and tone. Normal symmetric reflexes. Normal coordination and gait    Assessment:    Healthy female exam.       Plan:    ghm utd Check labs  See After Visit Summary for Counseling Recommendations     1. Encounter for long-term (current) use of high-risk medication  - DG Bone Density; Future  2. Preventative health care See above - Lipid panel - CBC with Differential/Platelet - TSH - Comprehensive  metabolic panel

## 2019-10-10 NOTE — Patient Instructions (Signed)

## 2019-10-11 LAB — LIPID PANEL
Cholesterol: 175 mg/dL (ref 0–200)
HDL: 71 mg/dL (ref >39.00)
LDL Cholesterol: 94 mg/dL (ref 0–99)
NonHDL: 104.22
Total CHOL/HDL Ratio: 2
Triglycerides: 51 mg/dL (ref 0.0–149.0)
VLDL: 10.2 mg/dL (ref 0.0–40.0)

## 2019-10-11 LAB — CBC WITH DIFFERENTIAL/PLATELET
Basophils Absolute: 0.1 10*3/uL (ref 0.0–0.1)
Basophils Relative: 1.4 % (ref 0.0–3.0)
Eosinophils Absolute: 0.1 10*3/uL (ref 0.0–0.7)
Eosinophils Relative: 1.4 % (ref 0.0–5.0)
HCT: 41.6 % (ref 36.0–46.0)
Hemoglobin: 14.1 g/dL (ref 12.0–15.0)
Lymphocytes Relative: 30.2 % (ref 12.0–46.0)
Lymphs Abs: 2.9 10*3/uL (ref 0.7–4.0)
MCHC: 33.8 g/dL (ref 30.0–36.0)
MCV: 93.8 fl (ref 78.0–100.0)
Monocytes Absolute: 0.7 10*3/uL (ref 0.1–1.0)
Monocytes Relative: 7.3 % (ref 3.0–12.0)
Neutro Abs: 5.8 10*3/uL (ref 1.4–7.7)
Neutrophils Relative %: 59.7 % (ref 43.0–77.0)
Platelets: 347 10*3/uL (ref 150.0–400.0)
RBC: 4.43 Mil/uL (ref 3.87–5.11)
RDW: 13.5 % (ref 11.5–15.5)
WBC: 9.6 10*3/uL (ref 4.0–10.5)

## 2019-10-11 LAB — COMPREHENSIVE METABOLIC PANEL
ALT: 14 U/L (ref 0–35)
AST: 16 U/L (ref 0–37)
Albumin: 4.2 g/dL (ref 3.5–5.2)
Alkaline Phosphatase: 61 U/L (ref 39–117)
BUN: 16 mg/dL (ref 6–23)
CO2: 21 mEq/L (ref 19–32)
Calcium: 9.5 mg/dL (ref 8.4–10.5)
Chloride: 105 mEq/L (ref 96–112)
Creatinine, Ser: 0.79 mg/dL (ref 0.40–1.20)
GFR: 96.76 mL/min (ref >60.00)
Glucose, Bld: 83 mg/dL (ref 70–99)
Potassium: 4.2 mEq/L (ref 3.5–5.1)
Sodium: 138 mEq/L (ref 135–145)
Total Bilirubin: 0.9 mg/dL (ref 0.2–1.2)
Total Protein: 7.3 g/dL (ref 6.0–8.3)

## 2019-10-11 LAB — TSH: TSH: 1.44 u[IU]/mL (ref 0.35–4.50)

## 2019-10-16 ENCOUNTER — Other Ambulatory Visit: Payer: Self-pay

## 2019-10-16 ENCOUNTER — Ambulatory Visit (HOSPITAL_BASED_OUTPATIENT_CLINIC_OR_DEPARTMENT_OTHER)
Admission: RE | Admit: 2019-10-16 | Discharge: 2019-10-16 | Disposition: A | Discharge status: Home / Self Care | Payer: No Typology Code available for payment source | Source: Ambulatory Visit | Attending: Family Medicine | Admitting: Family Medicine

## 2019-10-16 ENCOUNTER — Encounter (HOSPITAL_BASED_OUTPATIENT_CLINIC_OR_DEPARTMENT_OTHER): Payer: Self-pay

## 2019-10-16 DIAGNOSIS — Z1231 Encounter for screening mammogram for malignant neoplasm of breast: Secondary | ICD-10-CM | POA: Diagnosis not present

## 2019-10-16 DIAGNOSIS — Z780 Asymptomatic menopausal state: Secondary | ICD-10-CM | POA: Diagnosis not present

## 2019-10-16 DIAGNOSIS — Z79899 Other long term (current) drug therapy: Secondary | ICD-10-CM | POA: Insufficient documentation

## 2019-10-16 DIAGNOSIS — R29890 Loss of height: Secondary | ICD-10-CM | POA: Diagnosis not present

## 2019-10-17 ENCOUNTER — Encounter: Payer: Self-pay | Admitting: Family Medicine

## 2019-12-15 ENCOUNTER — Encounter: Payer: Self-pay | Admitting: Family Medicine

## 2019-12-18 ENCOUNTER — Other Ambulatory Visit: Payer: Self-pay | Admitting: Family Medicine

## 2019-12-18 ENCOUNTER — Encounter: Payer: Self-pay | Admitting: Family Medicine

## 2019-12-18 DIAGNOSIS — N281 Cyst of kidney, acquired: Secondary | ICD-10-CM

## 2019-12-18 NOTE — Telephone Encounter (Signed)
No -- -next Friday is ok--- we can schedule renal US in the meantime

## 2019-12-18 NOTE — Telephone Encounter (Signed)
Response sent on other message.

## 2019-12-20 ENCOUNTER — Other Ambulatory Visit: Payer: Self-pay

## 2019-12-20 ENCOUNTER — Ambulatory Visit (HOSPITAL_BASED_OUTPATIENT_CLINIC_OR_DEPARTMENT_OTHER)
Admission: RE | Admit: 2019-12-20 | Discharge: 2019-12-20 | Disposition: A | Discharge status: Home / Self Care | Payer: No Typology Code available for payment source | Source: Ambulatory Visit | Attending: Family Medicine | Admitting: Family Medicine

## 2019-12-20 DIAGNOSIS — N281 Cyst of kidney, acquired: Secondary | ICD-10-CM | POA: Diagnosis not present

## 2019-12-22 ENCOUNTER — Other Ambulatory Visit: Payer: Self-pay

## 2019-12-22 ENCOUNTER — Ambulatory Visit (INDEPENDENT_AMBULATORY_CARE_PROVIDER_SITE_OTHER): Payer: No Typology Code available for payment source | Admitting: Family Medicine

## 2019-12-22 ENCOUNTER — Encounter: Payer: Self-pay | Admitting: Family Medicine

## 2019-12-22 VITALS — BP 116/80 | HR 80 | Temp 98.1°F | Resp 18 | Ht 65.0 in | Wt 220.4 lb

## 2019-12-22 DIAGNOSIS — N281 Cyst of kidney, acquired: Secondary | ICD-10-CM | POA: Diagnosis not present

## 2019-12-22 DIAGNOSIS — M51369 Other intervertebral disc degeneration, lumbar region without mention of lumbar back pain or lower extremity pain: Secondary | ICD-10-CM | POA: Insufficient documentation

## 2019-12-22 DIAGNOSIS — R109 Unspecified abdominal pain: Secondary | ICD-10-CM

## 2019-12-22 DIAGNOSIS — M5126 Other intervertebral disc displacement, lumbar region: Secondary | ICD-10-CM

## 2019-12-22 DIAGNOSIS — M5136 Other intervertebral disc degeneration, lumbar region: Secondary | ICD-10-CM

## 2019-12-22 NOTE — Progress Notes (Signed)
Patient ID: Jody Bryan, female    DOB: 1981/01/30  Age: 39 y.o. MRN: [id number]    Subjective:  Subjective  HPI Jody Bryan presents for f/u spine dr -- -her Jody Bryan was in June 29 and she saw a neurosurgeon in [place]  An mri was done and she had bulging discs  There was also and problem with ? Mass on kidney.  We ordered and [name]--- they were simple cysts but she would still like to see a urologists because of the pain she is having   Review of Systems  Constitutional: Negative for appetite change, diaphoresis, fatigue and unexpected weight change.  Eyes: Negative for pain, redness and visual disturbance.  Respiratory: Negative for cough, chest tightness, shortness of breath and wheezing.   Cardiovascular: Negative for chest pain, palpitations and leg swelling.  Endocrine: Negative for cold intolerance, heat intolerance, polydipsia, polyphagia and polyuria.  Genitourinary: Negative for difficulty urinating, dysuria and frequency.  Musculoskeletal: Positive for back pain and gait problem.  Neurological: Negative for dizziness, light-headedness, numbness and headaches.    History Past Medical History:  Diagnosis Date  . Bilateral leg edema   . Chicken pox   . Depression   . Drug use   . Dyspnea   . GERD (gastroesophageal reflux disease)   . HTN (hypertension)   . Low back pain     She has no past surgical history on file.   Her family history includes Alcohol abuse in her maternal grandfather and sister; Arthritis in her paternal grandmother; Cancer in her paternal grandmother; Depression in her father; Drug abuse in her father; Heart disease in her maternal grandmother; Hypertension in her father and mother; Obesity in her father and mother; Sleep apnea in her father; Stroke in her sister.She reports that she has been smoking cigarettes. She has smoked for the past 2.00 years. She has never used smokeless tobacco. She reports current alcohol use. She reports that she does  not use drugs.  No current outpatient medications on file prior to visit.   No current facility-administered medications on file prior to visit.     Objective:  Objective  Physical Exam Vitals and nursing note reviewed.  Constitutional:      Appearance: She is well-developed.  HENT:     Head: Normocephalic and atraumatic.  Eyes:     Conjunctiva/sclera: Conjunctivae normal.  Neck:     Thyroid: No thyromegaly.     Vascular: No carotid bruit or JVD.  Cardiovascular:     Rate and Rhythm: Normal rate and regular rhythm.     Heart sounds: Normal heart sounds. No murmur heard.   Pulmonary:     Effort: Pulmonary effort is normal. No respiratory distress.     Breath sounds: Normal breath sounds. No wheezing or rales.  Chest:     Chest wall: No tenderness.  Musculoskeletal:        General: Tenderness present.     Cervical back: Normal range of motion and neck supple.     Right lower leg: No edema.     Left lower leg: No edema.  Neurological:     Mental Status: She is alert and oriented to person, place, and time.     Motor: No weakness.     Coordination: Coordination normal.     Gait: Gait normal.     Deep Tendon Reflexes: Reflexes normal.  Psychiatric:        Mood and Affect: Mood normal.        Behavior: Behavior  normal.        Thought Content: Thought content normal.        Judgment: Judgment normal.    BP 116/80 (BP Location: Left Arm, Patient Position: Sitting, Cuff Size: Large)   Pulse 80   Temp 98.1 F (36.7 C) (Oral)   Resp 18   Ht 5\' 5"  (1.651 m)   Wt 220 lb 6.4 oz (100 kg)   SpO2 99%   BMI 36.68 kg/m  Wt Readings from Last 3 Encounters:  12/22/19 220 lb 6.4 oz (100 kg)  10/10/19 223 lb 12.8 oz (101.5 kg)  04/03/19 225 lb (102.1 kg)     Lab Results  Component Value Date   WBC 9.6 10/10/2019   HGB 14.1 10/10/2019   HCT 41.6 10/10/2019   PLT 347.0 10/10/2019   GLUCOSE 83 10/10/2019   CHOL 175 10/10/2019   TRIG 51.0 10/10/2019   HDL 71.00  10/10/2019   LDLCALC 94 10/10/2019   ALT 14 10/10/2019   AST 16 10/10/2019   NA 138 10/10/2019   K 4.2 10/10/2019   CL 105 10/10/2019   CREATININE 0.79 10/10/2019   BUN 16 10/10/2019   CO2 21 10/10/2019   TSH 1.44 10/10/2019   HGBA1C 5.4 06/24/2017    06/26/2017 Renal  Result Date: 12/20/2019 CLINICAL DATA:  Renal cyst on outside MR EXAM: RENAL / URINARY TRACT ULTRASOUND COMPLETE COMPARISON:  None FINDINGS: Right Kidney: Renal measurements: 11.5 x 6.1 x 5.6 cm = volume: 210 mL. Normal cortical thickness. Upper normal cortical echogenicity. No mass, hydronephrosis or shadowing calcification. Left Kidney: Renal measurements: 11.3 x 5.8 x 5.2 cm = volume: 179 mL. Normal cortical thickness. Upper normal cortical echogenicity. Cyst identified at upper pole 3.2 x 3.4 x 3.7 cm, simple features. Additional mid renal cyst 2.9 x 3.0 x 2.9 cm, simple features. No solid mass or hydronephrosis. No shadowing calcification. Bladder: Appears normal for degree of bladder distention. Other: N/A IMPRESSION: Two LEFT renal cysts measuring 3.7 cm and 3.0 cm in greatest sizes. Remainder of exam unremarkable. Electronically Signed   By: 12/22/2019 M.D.   On: 12/20/2019 17:29     Assessment & Plan:  Plan  Jody Bryan does not currently have medications on file.  No orders of the defined types were placed in this encounter.   Problem List Items Addressed This Visit      Unprioritized   Bulging lumbar disc    mostl likely causing her back pain Refer to PT F/u neurosurgeon prn Mri in care everywhere reviewed      Relevant Orders   Ambulatory referral to Physical Therapy   Renal cyst - Primary    Simple benign cyst Refer to urology-- at pt request       Relevant Orders   Ambulatory referral to Urology    Other Visit Diagnoses    Left flank pain       Relevant Orders   Ambulatory referral to Urology      Follow-up: Return if symptoms worsen or fail to improve.  Jody Evens, DO

## 2019-12-22 NOTE — Telephone Encounter (Signed)
FMLA forms completed. Placed in providers folder for signature.

## 2019-12-22 NOTE — Telephone Encounter (Signed)
FMLA printed and on my desk

## 2019-12-22 NOTE — Assessment & Plan Note (Signed)
Simple benign cyst Refer to urology-- at pt request

## 2019-12-22 NOTE — Assessment & Plan Note (Signed)
mostl likely causing her back pain Refer to PT F/u neurosurgeon prn Mri in care everywhere reviewed

## 2019-12-22 NOTE — Telephone Encounter (Signed)
This needs to be printed and scanned into pt chart

## 2019-12-22 NOTE — Patient Instructions (Signed)
Acute Back Pain, Adult Acute back pain is sudden and usually short-lived. It is often caused by an injury to the muscles and tissues in the back. The injury may result from:  A muscle or ligament getting overstretched or torn (strained). Ligaments are tissues that connect bones to each other. Lifting something improperly can cause a back strain.  Wear and tear (degeneration) of the spinal disks. Spinal disks are circular tissue that provides cushioning between the bones of the spine (vertebrae).  Twisting motions, such as while playing sports or doing yard work.  A hit to the back.  Arthritis. You may have a physical exam, lab tests, and imaging tests to find the cause of your pain. Acute back pain usually goes away with rest and home care. Follow these instructions at home: Managing pain, stiffness, and swelling  Take over-the-counter and prescription medicines only as told by your health care provider.  Your health care provider may recommend applying ice during the first 24-48 hours after your pain starts. To do this: ? Put ice in a plastic bag. ? Place a towel between your skin and the bag. ? Leave the ice on for 20 minutes, 2-3 times a day.  If directed, apply heat to the affected area as often as told by your health care provider. Use the heat source that your health care provider recommends, such as a moist heat pack or a heating pad. ? Place a towel between your skin and the heat source. ? Leave the heat on for 20-30 minutes. ? Remove the heat if your skin turns bright red. This is especially important if you are unable to feel pain, heat, or cold. You have a greater risk of getting burned. Activity   Do not stay in bed. Staying in bed for more than 1-2 days can delay your recovery.  Sit up and stand up straight. Avoid leaning forward when you sit, or hunching over when you stand. ? If you work at a desk, sit close to it so you do not need to lean over. Keep your chin tucked  in. Keep your neck drawn back, and keep your elbows bent at a right angle. Your arms should look like the letter "L." ? Sit high and close to the steering wheel when you drive. Add lower back (lumbar) support to your car seat, if needed.  Take short walks on even surfaces as soon as you are able. Try to increase the length of time you walk each day.  Do not sit, drive, or stand in one place for more than 30 minutes at a time. Sitting or standing for long periods of time can put stress on your back.  Do not drive or use heavy machinery while taking prescription pain medicine.  Use proper lifting techniques. When you bend and lift, use positions that put less stress on your back: ? Bend your knees. ? Keep the load close to your body. ? Avoid twisting.  Exercise regularly as told by your health care provider. Exercising helps your back heal faster and helps prevent back injuries by keeping muscles strong and flexible.  Work with a physical therapist to make a safe exercise program, as recommended by your health care provider. Do any exercises as told by your physical therapist. Lifestyle  Maintain a healthy weight. Extra weight puts stress on your back and makes it difficult to have good posture.  Avoid activities or situations that make you feel anxious or stressed. Stress and anxiety increase muscle   tension and can make back pain worse. Learn ways to manage anxiety and stress, such as through exercise. General instructions  Sleep on a firm mattress in a comfortable position. Try lying on your side with your knees slightly bent. If you lie on your back, put a pillow under your knees.  Follow your treatment plan as told by your health care provider. This may include: ? Cognitive or behavioral therapy. ? Acupuncture or massage therapy. ? Meditation or yoga. Contact a health care provider if:  You have pain that is not relieved with rest or medicine.  You have increasing pain going down  into your legs or buttocks.  Your pain does not improve after 2 weeks.  You have pain at night.  You lose weight without trying.  You have a fever or chills. Get help right away if:  You develop new bowel or bladder control problems.  You have unusual weakness or numbness in your arms or legs.  You develop nausea or vomiting.  You develop abdominal pain.  You feel faint. Summary  Acute back pain is sudden and usually short-lived.  Use proper lifting techniques. When you bend and lift, use positions that put less stress on your back.  Take over-the-counter and prescription medicines and apply heat or ice as directed by your health care provider. This information is not intended to replace advice given to you by your health care provider. Make sure you discuss any questions you have with your health care provider. Document Revised: 06/07/2018 Document Reviewed: 09/30/2016 Elsevier Patient Education  2020 [employer]  

## 2019-12-25 NOTE — Telephone Encounter (Signed)
I have not seen them

## 2019-12-25 NOTE — Telephone Encounter (Signed)
Forms printed and placed to scan

## 2019-12-26 NOTE — Telephone Encounter (Signed)
She just put it in my office this am

## 2019-12-26 NOTE — Telephone Encounter (Signed)
They are in your quick sign folder in  your office. Did check again this morning in case!

## 2019-12-26 NOTE — Telephone Encounter (Signed)
Done

## 2020-01-11 ENCOUNTER — Ambulatory Visit: Payer: No Typology Code available for payment source | Admitting: Family Medicine

## 2020-01-11 DIAGNOSIS — M5416 Radiculopathy, lumbar region: Secondary | ICD-10-CM | POA: Diagnosis not present

## 2020-01-11 DIAGNOSIS — M545 Low back pain, unspecified: Secondary | ICD-10-CM | POA: Diagnosis not present

## 2020-01-11 DIAGNOSIS — M25552 Pain in left hip: Secondary | ICD-10-CM | POA: Diagnosis not present

## 2020-01-11 NOTE — Telephone Encounter (Signed)
[facility] or there is ob here in the building There is [facility] in [place] or [facility]

## 2020-01-30 DIAGNOSIS — M545 Low back pain, unspecified: Secondary | ICD-10-CM | POA: Diagnosis not present

## 2020-01-30 DIAGNOSIS — M5416 Radiculopathy, lumbar region: Secondary | ICD-10-CM | POA: Diagnosis not present

## 2020-01-30 DIAGNOSIS — M25552 Pain in left hip: Secondary | ICD-10-CM | POA: Diagnosis not present

## 2020-02-02 DIAGNOSIS — M5416 Radiculopathy, lumbar region: Secondary | ICD-10-CM | POA: Diagnosis not present

## 2020-02-02 DIAGNOSIS — M25552 Pain in left hip: Secondary | ICD-10-CM | POA: Diagnosis not present

## 2020-02-02 DIAGNOSIS — M545 Low back pain, unspecified: Secondary | ICD-10-CM | POA: Diagnosis not present

## 2020-02-06 DIAGNOSIS — M545 Low back pain, unspecified: Secondary | ICD-10-CM | POA: Diagnosis not present

## 2020-02-06 DIAGNOSIS — M5416 Radiculopathy, lumbar region: Secondary | ICD-10-CM | POA: Diagnosis not present

## 2020-02-06 DIAGNOSIS — M25552 Pain in left hip: Secondary | ICD-10-CM | POA: Diagnosis not present

## 2020-02-08 DIAGNOSIS — M25552 Pain in left hip: Secondary | ICD-10-CM | POA: Diagnosis not present

## 2020-02-08 DIAGNOSIS — M545 Low back pain, unspecified: Secondary | ICD-10-CM | POA: Diagnosis not present

## 2020-02-08 DIAGNOSIS — M5416 Radiculopathy, lumbar region: Secondary | ICD-10-CM | POA: Diagnosis not present

## 2020-02-12 ENCOUNTER — Other Ambulatory Visit: Payer: Self-pay

## 2020-02-13 DIAGNOSIS — M25552 Pain in left hip: Secondary | ICD-10-CM | POA: Diagnosis not present

## 2020-02-13 DIAGNOSIS — M5416 Radiculopathy, lumbar region: Secondary | ICD-10-CM | POA: Diagnosis not present

## 2020-02-13 DIAGNOSIS — M545 Low back pain, unspecified: Secondary | ICD-10-CM | POA: Diagnosis not present

## 2020-02-15 DIAGNOSIS — M25552 Pain in left hip: Secondary | ICD-10-CM | POA: Diagnosis not present

## 2020-02-15 DIAGNOSIS — M545 Low back pain, unspecified: Secondary | ICD-10-CM | POA: Diagnosis not present

## 2020-02-15 DIAGNOSIS — M5416 Radiculopathy, lumbar region: Secondary | ICD-10-CM | POA: Diagnosis not present

## 2020-02-16 DIAGNOSIS — N2889 Other specified disorders of kidney and ureter: Secondary | ICD-10-CM | POA: Diagnosis not present

## 2020-02-22 ENCOUNTER — Ambulatory Visit (INDEPENDENT_AMBULATORY_CARE_PROVIDER_SITE_OTHER): Payer: No Typology Code available for payment source | Admitting: Family Medicine

## 2020-02-22 ENCOUNTER — Other Ambulatory Visit (HOSPITAL_COMMUNITY)
Admission: RE | Admit: 2020-02-22 | Discharge: 2020-02-22 | Disposition: A | Discharge status: Home / Self Care | Payer: No Typology Code available for payment source | Source: Ambulatory Visit | Attending: Family Medicine | Admitting: Family Medicine

## 2020-02-22 ENCOUNTER — Encounter: Payer: Self-pay | Admitting: Family Medicine

## 2020-02-22 ENCOUNTER — Other Ambulatory Visit: Payer: Self-pay

## 2020-02-22 VITALS — BP 124/80 | HR 88 | Wt 213.0 lb

## 2020-02-22 DIAGNOSIS — O09529 Supervision of elderly multigravida, unspecified trimester: Secondary | ICD-10-CM

## 2020-02-22 DIAGNOSIS — Z3480 Encounter for supervision of other normal pregnancy, unspecified trimester: Secondary | ICD-10-CM | POA: Diagnosis not present

## 2020-02-22 DIAGNOSIS — O09511 Supervision of elderly primigravida, first trimester: Secondary | ICD-10-CM | POA: Diagnosis not present

## 2020-02-22 DIAGNOSIS — R7303 Prediabetes: Secondary | ICD-10-CM

## 2020-02-22 DIAGNOSIS — Z3A11 11 weeks gestation of pregnancy: Secondary | ICD-10-CM

## 2020-02-22 DIAGNOSIS — N281 Cyst of kidney, acquired: Secondary | ICD-10-CM | POA: Diagnosis not present

## 2020-02-22 NOTE — Progress Notes (Signed)
Subjective:  Jody Bryan is a G2P1001 57w2dbeing seen today for her first obstetrical visit.  Her obstetrical history is significant for SVD in 2012. Uncomplicated pregnancy and delivery. This is a desired pregnancy. Patient does intend to breast feed. Pregnancy history fully reviewed.  Patient reports nausea.  BP 124/80   Pulse 88   Wt 213 lb (96.6 kg)   LMP 12/05/2019   BMI 35.45 kg/m   HISTORY: OB History  Gravida Para Term Preterm AB Living  '2 1 1     1  ' SAB IAB Ectopic Multiple Live Births          1    # Outcome Date GA Lbr Len/2nd Weight Sex Delivery Anes PTL Lv  2 Current           1 Term 02/18/11    MJerilynn MagesVag-Spont       Past Medical History:  Diagnosis Date  . Bilateral leg edema   . Chicken pox   . Depression   . Drug use   . Dyspnea   . GERD (gastroesophageal reflux disease)   . Low back pain     History reviewed. No pertinent surgical history.  Family History  Problem Relation Age of Onset  . Hypertension Mother   . Obesity Mother   . Hypertension Father   . Depression Father   . Sleep apnea Father   . Drug abuse Father   . Obesity Father   . Arthritis Paternal Grandmother   . Cancer Paternal Grandmother        breast  . Alcohol abuse Sister   . Stroke Sister   . Heart disease Maternal Grandmother   . Alcohol abuse Maternal Grandfather      Exam  BP 124/80   Pulse 88   Wt 213 lb (96.6 kg)   LMP 12/05/2019   BMI 35.45 kg/m   Chaperone present during exam  CONSTITUTIONAL: Well-developed, well-nourished female in no acute distress.  HENT:  Normocephalic, atraumatic, External right and left ear normal. Oropharynx is clear and moist EYES: Conjunctivae and EOM are normal. Pupils are equal, round, and reactive to light. No scleral icterus.  NECK: Normal range of motion, supple, no masses.  Normal thyroid.  CARDIOVASCULAR: Normal heart rate noted, regular rhythm RESPIRATORY: Clear to auscultation bilaterally. Effort and breath sounds  normal, no problems with respiration noted. BREASTS: Symmetric in size. No masses, skin changes, nipple drainage, or lymphadenopathy. ABDOMEN: Soft, normal bowel sounds, no distention noted.  No tenderness, rebound or guarding.  PELVIC: Normal appearing external genitalia; normal appearing vaginal mucosa and cervix. No abnormal discharge noted. Normal uterine size, no other palpable masses, no uterine or adnexal tenderness. MUSCULOSKELETAL: Normal range of motion. No tenderness.  No cyanosis, clubbing, or edema.  2+ distal pulses. SKIN: Skin is warm and dry. No rash noted. Not diaphoretic. No erythema. No pallor. NEUROLOGIC: Alert and oriented to person, place, and time. Normal reflexes, muscle tone coordination. No cranial nerve deficit noted. PSYCHIATRIC: Normal mood and affect. Normal behavior. Normal judgment and thought content.    Assessment:    Pregnancy: G2P1001 Patient Active Problem List   Diagnosis Date Noted  . Renal cyst 12/22/2019  . Bulging lumbar disc 12/22/2019  . Preventative health care 08/09/2017  . Vitamin D deficiency 04/01/2017  . Prediabetes 04/01/2017  . Other fatigue 03/17/2017  . Shortness of breath on exertion 03/17/2017  . Pre-hypertension 03/17/2017  . Low back pain 07/23/2014  . Trapezius muscle spasm 07/17/2013  . Right  wrist pain 09/05/2012  . Menorrhagia 05/25/2012  . Toenail fungus 05/25/2012  . Routine general medical examination at a health care facility 05/25/2012  . Meralgia paraesthetica 05/25/2012      Plan:   1. [redacted] weeks gestation of pregnancy  2. Supervision of other normal pregnancy, antepartum [name] matches LMP.  Desires Panorama - will get today. Discussed delivery at [facility] - Urine Culture - Cytology - PAP( [place]) - CBC/D/Plt+RPR+Rh+ABO+Rub Ab... - Genetic Screening - US MFM OB DETAIL +14 WK; Future - Enroll Patient in Babyscripts - Hemoglobin A1c  3. Antepartum multigravida of advanced maternal age Discussed serial  [name] after 20wks.  Antenatal testing ASA starting at 12 weeks  4. Prediabetes - Hemoglobin A1c  5. Renal cyst Has seen urologist - no concerns from them. Will check CMP - Comp Met (CMET)     Problem list reviewed and updated. 75% of 30 min visit spent on counseling and coordination of care.     Truett Mainland 02/22/2020

## 2020-02-22 NOTE — Progress Notes (Signed)
DATING AND VIABILITY SONOGRAM   Jody Bryan is a 39 y.o. year old G2P0 with LMP Patient's last menstrual period was 10/12/2019. which would correlate to  Unknown weeks gestation.  She has regular menstrual cycles.   She is here today for a confirmatory initial sonogram.    GESTATION: SINGLETON  FETAL ACTIVITY:          Heart rate        166          The fetus is active     Previous Scans:0      CROWN RUMP LENGTH           4.72 cm      11-4 weeks                                                                               AVERAGE EGA(BY THIS SCAN):  11-4 weeks  WORKING EDD( LMP ):  09/10/20     TECHNICIAN COMMENTS: Patient informed that the ultrasound is considered a limited obstetric ultrasound and is not intended to be a complete ultrasound exam. Patient also informed that the ultrasound is not being completed with the intent of assessing for fetal or placental anomalies or any pelvic abnormalities. Explained that the purpose of today's ultrasound is to assess for fetal heart rate. Patient acknowledges the purpose of the exam and the limitations of the study.     [name] 02/22/2020 9:24 AM

## 2020-02-23 LAB — CBC/D/PLT+RPR+RH+ABO+RUB AB...
Antibody Screen: NEGATIVE
Basophils Absolute: 0 10*3/uL (ref 0.0–0.2)
Basos: 0 %
EOS (ABSOLUTE): 0 10*3/uL (ref 0.0–0.4)
Eos: 0 %
HCV Ab: <0.1 s/co ratio (ref 0.0–0.9)
HIV Screen 4th Generation wRfx: NONREACTIVE
Hematocrit: 39.6 % (ref 34.0–46.6)
Hemoglobin: 13.5 g/dL (ref 11.1–15.9)
Hepatitis B Surface Ag: NEGATIVE
Immature Grans (Abs): 0 10*3/uL (ref 0.0–0.1)
Immature Granulocytes: 0 %
Lymphocytes Absolute: 2.3 10*3/uL (ref 0.7–3.1)
Lymphs: 20 %
MCH: 31.1 pg (ref 26.6–33.0)
MCHC: 34.1 g/dL (ref 31.5–35.7)
MCV: 91 fL (ref 79–97)
Monocytes Absolute: 0.7 10*3/uL (ref 0.1–0.9)
Monocytes: 6 %
Neutrophils Absolute: 8.5 10*3/uL — ABNORMAL HIGH (ref 1.4–7.0)
Neutrophils: 74 %
Platelets: 339 10*3/uL (ref 150–450)
RBC: 4.34 x10E6/uL (ref 3.77–5.28)
RDW: 13.3 % (ref 11.7–15.4)
RPR Ser Ql: NONREACTIVE
Rh Factor: POSITIVE
Rubella Antibodies, IGG: 1.32 index (ref >0.99)
WBC: 11.6 10*3/uL — ABNORMAL HIGH (ref 3.4–10.8)

## 2020-02-23 LAB — COMPREHENSIVE METABOLIC PANEL
ALT: 14 IU/L (ref 0–32)
AST: 15 IU/L (ref 0–40)
Albumin/Globulin Ratio: 1.7 (ref 1.2–2.2)
Albumin: 4.4 g/dL (ref 3.8–4.8)
Alkaline Phosphatase: 66 IU/L (ref 44–121)
BUN/Creatinine Ratio: 16 (ref 9–23)
BUN: 9 mg/dL (ref 6–24)
Bilirubin Total: 0.8 mg/dL (ref 0.0–1.2)
CO2: 20 mmol/L (ref 20–29)
Calcium: 10 mg/dL (ref 8.7–10.2)
Chloride: 106 mmol/L (ref 96–106)
Creatinine, Ser: 0.57 mg/dL (ref 0.57–1.00)
GFR calc Af Amer: 133 mL/min/{1.73_m2} (ref >59)
GFR calc non Af Amer: 116 mL/min/{1.73_m2} (ref >59)
Globulin, Total: 2.6 g/dL (ref 1.5–4.5)
Glucose: 92 mg/dL (ref 65–99)
Potassium: 4.4 mmol/L (ref 3.5–5.2)
Sodium: 139 mmol/L (ref 134–144)
Total Protein: 7 g/dL (ref 6.0–8.5)

## 2020-02-23 LAB — HEMOGLOBIN A1C
Est. average glucose Bld gHb Est-mCnc: 108 mg/dL
Hgb A1c MFr Bld: 5.4 % (ref 4.8–5.6)

## 2020-02-23 LAB — HCV INTERPRETATION

## 2020-02-27 LAB — CYTOLOGY - PAP
Chlamydia: NEGATIVE
Comment: NEGATIVE
Comment: NEGATIVE
Comment: NORMAL
Diagnosis: NEGATIVE
High risk HPV: NEGATIVE
Neisseria Gonorrhea: NEGATIVE

## 2020-02-29 LAB — URINE CULTURE

## 2020-03-02 NOTE — L&D Delivery Note (Addendum)
OB/GYN Faculty Practice Delivery Note  Candance Bohlman is a 40 y.o. G2P1001 s/p vaginal delivery at [redacted]w[redacted]d. She was admitted for IOL secondary to fetal macrosomia.   ROM: 1h 32m with clear fluid GBS Status: negative Maximum Maternal Temperature: 37F  Labor Progress: On arrival, pt received several doses of cytotec and Cook's catheter was placed. She had SROM for clear fluid at approximately 2300, and then progressed spontaneously to complete cervical dilation. She then had an uncomplicated delivery as noted below s/p a short second stage.  Delivery Date/Time: 08/28/20 at 0056 Delivery: Called to room and patient was complete and pushing. Head delivered ROA. Tight body cord present x2. Shoulder and body delivered in usual fashion. Infant with spontaneous cry, placed on mother's abdomen, dried and stimulated. Cord clamped x 2 after 1-minute delay, and cut by FOB under my direct supervision. Cord blood drawn. Placenta delivered spontaneously with gentle cord traction. Fundus firm with massage and Pitocin. Labia, perineum, vagina, and cervix were inspected, without evidence of lacerations.  Placenta: 3-vessel cord, intact, sent to L&D Complications: none Lacerations: none EBL: 300 ml Analgesia: none  Infant: viable female  APGARs 9 & 9  weight per medical record  Lynnda Shields, MD OB/GYN Fellow, Faculty Practice

## 2020-03-12 DIAGNOSIS — O09529 Supervision of elderly multigravida, unspecified trimester: Secondary | ICD-10-CM | POA: Insufficient documentation

## 2020-03-12 DIAGNOSIS — Z3480 Encounter for supervision of other normal pregnancy, unspecified trimester: Secondary | ICD-10-CM | POA: Insufficient documentation

## 2020-03-12 DIAGNOSIS — M25552 Pain in left hip: Secondary | ICD-10-CM | POA: Diagnosis not present

## 2020-03-12 DIAGNOSIS — M545 Low back pain, unspecified: Secondary | ICD-10-CM | POA: Diagnosis not present

## 2020-03-12 DIAGNOSIS — M5416 Radiculopathy, lumbar region: Secondary | ICD-10-CM | POA: Diagnosis not present

## 2020-03-15 DIAGNOSIS — M5416 Radiculopathy, lumbar region: Secondary | ICD-10-CM | POA: Diagnosis not present

## 2020-03-15 DIAGNOSIS — M545 Low back pain, unspecified: Secondary | ICD-10-CM | POA: Diagnosis not present

## 2020-03-15 DIAGNOSIS — M25552 Pain in left hip: Secondary | ICD-10-CM | POA: Diagnosis not present

## 2020-03-20 ENCOUNTER — Encounter: Payer: No Typology Code available for payment source | Admitting: Obstetrics & Gynecology

## 2020-03-28 ENCOUNTER — Other Ambulatory Visit: Payer: Self-pay

## 2020-03-28 ENCOUNTER — Ambulatory Visit (INDEPENDENT_AMBULATORY_CARE_PROVIDER_SITE_OTHER): Payer: No Typology Code available for payment source | Admitting: Family Medicine

## 2020-03-28 VITALS — BP 113/66 | HR 93 | Wt 220.0 lb

## 2020-03-28 DIAGNOSIS — Z23 Encounter for immunization: Secondary | ICD-10-CM | POA: Diagnosis not present

## 2020-03-28 DIAGNOSIS — Z3A16 16 weeks gestation of pregnancy: Secondary | ICD-10-CM

## 2020-03-28 DIAGNOSIS — R7303 Prediabetes: Secondary | ICD-10-CM

## 2020-03-28 DIAGNOSIS — R030 Elevated blood-pressure reading, without diagnosis of hypertension: Secondary | ICD-10-CM

## 2020-03-28 DIAGNOSIS — O0990 Supervision of high risk pregnancy, unspecified, unspecified trimester: Secondary | ICD-10-CM | POA: Diagnosis not present

## 2020-03-28 DIAGNOSIS — Z3480 Encounter for supervision of other normal pregnancy, unspecified trimester: Secondary | ICD-10-CM

## 2020-03-28 DIAGNOSIS — O09529 Supervision of elderly multigravida, unspecified trimester: Secondary | ICD-10-CM

## 2020-03-28 NOTE — Progress Notes (Signed)
   PRENATAL VISIT NOTE  Subjective:  Jody Bryan is a 40 y.o. G2P1001 at [redacted]w[redacted]d being seen today for ongoing prenatal care.  She is currently monitored for the following issues for this high-risk pregnancy and has Toenail fungus; Meralgia paraesthetica; Right wrist pain; Low back pain; Other fatigue; Pre-hypertension; Vitamin D deficiency; Prediabetes; Preventative health care; Renal cyst; Bulging lumbar disc; Supervision of other normal pregnancy, antepartum; and Antepartum multigravida of advanced maternal age on their problem list.  Patient reports no complaints.  Contractions: Not present. Vag. Bleeding: None.  Movement: Absent. Denies leaking of fluid.   The following portions of the patient's history were reviewed and updated as appropriate: allergies, current medications, past family history, past medical history, past social history, past surgical history and problem list.   Objective:   Vitals:   03/28/20 1045  BP: 113/66  Pulse: 93  Weight: 220 lb (99.8 kg)    Fetal Status: Fetal Heart Rate (bpm): 161   Movement: Absent     General:  Alert, oriented and cooperative. Patient is in no acute distress.  Skin: Skin is warm and dry. No rash noted.   Cardiovascular: Normal heart rate noted  Respiratory: Normal respiratory effort, no problems with respiration noted  Abdomen: Soft, gravid, appropriate for gestational age.  Pain/Pressure: Absent     Pelvic: Cervical exam deferred        Extremities: Normal range of motion.  Edema: None  Mental Status: Normal mood and affect. Normal behavior. Normal judgment and thought content.   Assessment and Plan:  Pregnancy: G2P1001 at [redacted]w[redacted]d  1. [redacted] weeks gestation of pregnancy - AFP, Serum, Open Spina Bifida - Flu Vaccine QUAD 36+ mos IM  2. Supervision of other normal pregnancy, antepartum FHT and FH normal  3. Antepartum multigravida of advanced maternal age ASA 81mg   4. Prediabetes HgA1c 5.4.   5. Pre-hypertension BP  controlled. Per patient, never on medications   Preterm labor symptoms and general obstetric precautions including but not limited to vaginal bleeding, contractions, leaking of fluid and fetal movement were reviewed in detail with the patient. Please refer to After Visit Summary for other counseling recommendations.   Return in about 4 weeks (around 04/25/2020) for OB f/u, In Office.  Future Appointments  Date Time Provider Department Center  04/16/2020  8:30 AM [facility] US3 [facility]  04/25/2020  9:30 AM 04/27/2020, DO [facility] None    Levie Heritage, DO

## 2020-04-04 LAB — AFP, SERUM, OPEN SPINA BIFIDA
AFP MoM: 1.73
AFP Value: 52.7 ng/mL
Gest. Age on Collection Date: 16.3 weeks
Maternal Age At EDD: 42.8 yr
OSBR Risk 1 IN: 3028
Test Results:: NEGATIVE
Weight: 220 [lb_av]

## 2020-04-16 ENCOUNTER — Encounter: Payer: Self-pay | Admitting: *Deleted

## 2020-04-16 ENCOUNTER — Ambulatory Visit: Payer: No Typology Code available for payment source | Admitting: *Deleted

## 2020-04-16 ENCOUNTER — Other Ambulatory Visit: Payer: Self-pay | Admitting: *Deleted

## 2020-04-16 ENCOUNTER — Other Ambulatory Visit: Payer: Self-pay

## 2020-04-16 ENCOUNTER — Ambulatory Visit: Payer: No Typology Code available for payment source | Attending: Obstetrics and Gynecology

## 2020-04-16 VITALS — BP 128/75 | HR 90

## 2020-04-16 DIAGNOSIS — Z3480 Encounter for supervision of other normal pregnancy, unspecified trimester: Secondary | ICD-10-CM | POA: Diagnosis not present

## 2020-04-16 DIAGNOSIS — O09522 Supervision of elderly multigravida, second trimester: Secondary | ICD-10-CM | POA: Diagnosis not present

## 2020-04-16 DIAGNOSIS — Z362 Encounter for other antenatal screening follow-up: Secondary | ICD-10-CM

## 2020-04-25 ENCOUNTER — Ambulatory Visit (INDEPENDENT_AMBULATORY_CARE_PROVIDER_SITE_OTHER): Payer: No Typology Code available for payment source | Admitting: Family Medicine

## 2020-04-25 ENCOUNTER — Other Ambulatory Visit: Payer: Self-pay

## 2020-04-25 VITALS — BP 124/70 | HR 102 | Wt 229.0 lb

## 2020-04-25 DIAGNOSIS — O09529 Supervision of elderly multigravida, unspecified trimester: Secondary | ICD-10-CM

## 2020-04-25 DIAGNOSIS — Z3A20 20 weeks gestation of pregnancy: Secondary | ICD-10-CM

## 2020-04-25 DIAGNOSIS — R7303 Prediabetes: Secondary | ICD-10-CM

## 2020-04-25 DIAGNOSIS — Z3480 Encounter for supervision of other normal pregnancy, unspecified trimester: Secondary | ICD-10-CM

## 2020-04-25 DIAGNOSIS — R030 Elevated blood-pressure reading, without diagnosis of hypertension: Secondary | ICD-10-CM

## 2020-04-25 NOTE — Progress Notes (Signed)
   PRENATAL VISIT NOTE  Subjective:  Jody Bryan is a 40 y.o. G2P1001 at [redacted]w[redacted]d being seen today for ongoing prenatal care.  She is currently monitored for the following issues for this high-risk pregnancy and has Toenail fungus; Meralgia paraesthetica; Right wrist pain; Low back pain; Other fatigue; Pre-hypertension; Vitamin D deficiency; Prediabetes; Preventative health care; Renal cyst; Bulging lumbar disc; Supervision of other normal pregnancy, antepartum; and Antepartum multigravida of advanced maternal age on their problem list.  Patient reports no complaints.  Contractions: Not present. Vag. Bleeding: None.  Movement: Present. Denies leaking of fluid.   The following portions of the patient's history were reviewed and updated as appropriate: allergies, current medications, past family history, past medical history, past social history, past surgical history and problem list.   Objective:   Vitals:   04/25/20 0940  BP: 124/70  Pulse: (!) 102  Weight: 229 lb (103.9 kg)    Fetal Status: Fetal Heart Rate (bpm): 155 Fundal Height: 20 cm Movement: Present     General:  Alert, oriented and cooperative. Patient is in no acute distress.  Skin: Skin is warm and dry. No rash noted.   Cardiovascular: Normal heart rate noted  Respiratory: Normal respiratory effort, no problems with respiration noted  Abdomen: Soft, gravid, appropriate for gestational age.  Pain/Pressure: Present     Pelvic: Cervical exam deferred        Extremities: Normal range of motion.  Edema: None  Mental Status: Normal mood and affect. Normal behavior. Normal judgment and thought content.   Assessment and Plan:  Pregnancy: G2P1001 at [redacted]w[redacted]d 1. [redacted] weeks gestation of pregnancy  2. Supervision of other normal pregnancy, antepartum FHT and FH normal  3. Antepartum multigravida of advanced maternal age On ASA 81mg   4. Prediabetes  5. Pre-hypertension normal  Preterm labor symptoms and general obstetric  precautions including but not limited to vaginal bleeding, contractions, leaking of fluid and fetal movement were reviewed in detail with the patient. Please refer to After Visit Summary for other counseling recommendations.   Return in about 4 weeks (around 05/23/2020) for OB f/u.  Future Appointments  Date Time Provider Department Center  05/16/2020  7:45 AM [facility] NURSE [facility]  05/16/2020  8:00 AM [facility] US1 [facility]  05/23/2020 11:15 AM 05/25/2020, DO [facility] None    Levie Heritage, DO

## 2020-05-16 ENCOUNTER — Other Ambulatory Visit: Payer: Self-pay | Admitting: *Deleted

## 2020-05-16 ENCOUNTER — Ambulatory Visit: Payer: No Typology Code available for payment source | Admitting: *Deleted

## 2020-05-16 ENCOUNTER — Ambulatory Visit: Payer: No Typology Code available for payment source | Attending: Maternal & Fetal Medicine

## 2020-05-16 ENCOUNTER — Other Ambulatory Visit: Payer: Self-pay

## 2020-05-16 ENCOUNTER — Encounter: Payer: Self-pay | Admitting: *Deleted

## 2020-05-16 VITALS — BP 119/72 | HR 93

## 2020-05-16 DIAGNOSIS — O321XX0 Maternal care for breech presentation, not applicable or unspecified: Secondary | ICD-10-CM | POA: Diagnosis not present

## 2020-05-16 DIAGNOSIS — E669 Obesity, unspecified: Secondary | ICD-10-CM

## 2020-05-16 DIAGNOSIS — Z362 Encounter for other antenatal screening follow-up: Secondary | ICD-10-CM | POA: Insufficient documentation

## 2020-05-16 DIAGNOSIS — Z363 Encounter for antenatal screening for malformations: Secondary | ICD-10-CM

## 2020-05-16 DIAGNOSIS — O99212 Obesity complicating pregnancy, second trimester: Secondary | ICD-10-CM

## 2020-05-16 DIAGNOSIS — O09522 Supervision of elderly multigravida, second trimester: Secondary | ICD-10-CM | POA: Diagnosis not present

## 2020-05-16 DIAGNOSIS — R638 Other symptoms and signs concerning food and fluid intake: Secondary | ICD-10-CM

## 2020-05-16 DIAGNOSIS — Z3A23 23 weeks gestation of pregnancy: Secondary | ICD-10-CM | POA: Diagnosis not present

## 2020-05-23 ENCOUNTER — Other Ambulatory Visit: Payer: Self-pay

## 2020-05-23 ENCOUNTER — Ambulatory Visit (INDEPENDENT_AMBULATORY_CARE_PROVIDER_SITE_OTHER): Payer: No Typology Code available for payment source | Admitting: Family Medicine

## 2020-05-23 VITALS — BP 113/69 | HR 98 | Wt 241.0 lb

## 2020-05-23 DIAGNOSIS — R030 Elevated blood-pressure reading, without diagnosis of hypertension: Secondary | ICD-10-CM

## 2020-05-23 DIAGNOSIS — Z3A24 24 weeks gestation of pregnancy: Secondary | ICD-10-CM

## 2020-05-23 DIAGNOSIS — O09529 Supervision of elderly multigravida, unspecified trimester: Secondary | ICD-10-CM

## 2020-05-23 DIAGNOSIS — R7303 Prediabetes: Secondary | ICD-10-CM

## 2020-05-23 DIAGNOSIS — Z3480 Encounter for supervision of other normal pregnancy, unspecified trimester: Secondary | ICD-10-CM

## 2020-05-23 NOTE — Progress Notes (Signed)
   PRENATAL VISIT NOTE  Subjective:  Jody Bryan is a 40 y.o. G2P1001 at [redacted]w[redacted]d being seen today for ongoing prenatal care.  She is currently monitored for the following issues for this high-risk pregnancy and has Toenail fungus; Meralgia paraesthetica; Right wrist pain; Low back pain; Other fatigue; Pre-hypertension; Vitamin D deficiency; Prediabetes; Preventative health care; Renal cyst; Bulging lumbar disc; Supervision of other normal pregnancy, antepartum; and Antepartum multigravida of advanced maternal age on their problem list.  Patient reports no complaints.  Contractions: Not present. Vag. Bleeding: None.  Movement: Present. Denies leaking of fluid.   The following portions of the patient's history were reviewed and updated as appropriate: allergies, current medications, past family history, past medical history, past social history, past surgical history and problem list.   Objective:   Vitals:   05/23/20 1119  BP: 113/69  Pulse: 98  Weight: 241 lb (109.3 kg)    Fetal Status: Fetal Heart Rate (bpm): 151   Movement: Present     General:  Alert, oriented and cooperative. Patient is in no acute distress.  Skin: Skin is warm and dry. No rash noted.   Cardiovascular: Normal heart rate noted  Respiratory: Normal respiratory effort, no problems with respiration noted  Abdomen: Soft, gravid, appropriate for gestational age.  Pain/Pressure: Absent     Pelvic: Cervical exam deferred        Extremities: Normal range of motion.  Edema: None  Mental Status: Normal mood and affect. Normal behavior. Normal judgment and thought content.   Assessment and Plan:  Pregnancy: G2P1001 at [redacted]w[redacted]d 1. [redacted] weeks gestation of pregnancy  2. Supervision of other normal pregnancy, antepartum FHT and FH normal  3. Antepartum multigravida of advanced maternal age On ASA 81mg  Continue serial for growth.  4. Prediabetes Normal HgA1c. 2hr GTT next appt.  5. Pre-hypertension BP  normal  Preterm labor symptoms and general obstetric precautions including but not limited to vaginal bleeding, contractions, leaking of fluid and fetal movement were reviewed in detail with the patient. Please refer to After Visit Summary for other counseling recommendations.   No follow-ups on file.  Future Appointments  Date Time Provider Department Center  06/19/2020 10:15 AM 06/21/2020, DO [facility] None  06/20/2020  7:45 AM [facility] NURSE [facility]  06/20/2020  8:00 AM [facility] US1 [facility]    06/22/2020, DO

## 2020-06-19 ENCOUNTER — Ambulatory Visit (INDEPENDENT_AMBULATORY_CARE_PROVIDER_SITE_OTHER): Payer: No Typology Code available for payment source | Admitting: Family Medicine

## 2020-06-19 ENCOUNTER — Other Ambulatory Visit: Payer: Self-pay

## 2020-06-19 VITALS — BP 130/76 | HR 118 | Wt 243.0 lb

## 2020-06-19 DIAGNOSIS — Z3480 Encounter for supervision of other normal pregnancy, unspecified trimester: Secondary | ICD-10-CM

## 2020-06-19 DIAGNOSIS — Z3A28 28 weeks gestation of pregnancy: Secondary | ICD-10-CM

## 2020-06-19 DIAGNOSIS — Z3483 Encounter for supervision of other normal pregnancy, third trimester: Secondary | ICD-10-CM

## 2020-06-19 DIAGNOSIS — Z23 Encounter for immunization: Secondary | ICD-10-CM | POA: Diagnosis not present

## 2020-06-19 DIAGNOSIS — R030 Elevated blood-pressure reading, without diagnosis of hypertension: Secondary | ICD-10-CM

## 2020-06-19 DIAGNOSIS — O24419 Gestational diabetes mellitus in pregnancy, unspecified control: Secondary | ICD-10-CM

## 2020-06-19 DIAGNOSIS — R7303 Prediabetes: Secondary | ICD-10-CM

## 2020-06-19 NOTE — Progress Notes (Signed)
   PRENATAL VISIT NOTE  Subjective:  Jody Bryan is a 40 y.o. G2P1001 at [redacted]w[redacted]d being seen today for ongoing prenatal care.  She is currently monitored for the following issues for this high-risk pregnancy and has Toenail fungus; Meralgia paraesthetica; Right wrist pain; Low back pain; Other fatigue; Pre-hypertension; Vitamin D deficiency; Prediabetes; Preventative health care; Renal cyst; Bulging lumbar disc; Supervision of other normal pregnancy, antepartum; and Antepartum multigravida of advanced maternal age on their problem list.  Patient reports no complaints.  Contractions: Not present. Vag. Bleeding: None.  Movement: Present. Denies leaking of fluid.   The following portions of the patient's history were reviewed and updated as appropriate: allergies, current medications, past family history, past medical history, past social history, past surgical history and problem list.   Objective:   Vitals:   06/19/20 0841  BP: 130/76  Pulse: (!) 118  Weight: 243 lb (110.2 kg)    Fetal Status: Fetal Heart Rate (bpm): 148 Fundal Height: 29 cm Movement: Present  Presentation: Complete Breech  General:  Alert, oriented and cooperative. Patient is in no acute distress.  Skin: Skin is warm and dry. No rash noted.   Cardiovascular: Normal heart rate noted  Respiratory: Normal respiratory effort, no problems with respiration noted  Abdomen: Soft, gravid, appropriate for gestational age.  Pain/Pressure: Absent     Pelvic: Cervical exam deferred        Extremities: Normal range of motion.  Edema: None  Mental Status: Normal mood and affect. Normal behavior. Normal judgment and thought content.   Assessment and Plan:  Pregnancy: G2P1001 at [redacted]w[redacted]d 1. Supervision of other normal pregnancy, antepartum FHT and FH normal. Breech position today - Glucose Tolerance, 2 Hours w/1 Hour - CBC - HIV antibody (with reflex) - RPR  2. [redacted] weeks gestation of pregnancy - Glucose Tolerance, 2 Hours w/1  Hour - CBC - HIV antibody (with reflex) - RPR  3. Prediabetes Had normal early testing. 2hr GTT today  4. Pre-hypertension BP 130/76 today ASA 81mg   Preterm labor symptoms and general obstetric precautions including but not limited to vaginal bleeding, contractions, leaking of fluid and fetal movement were reviewed in detail with the patient. Please refer to After Visit Summary for other counseling recommendations.   Return in about 2 weeks (around 07/03/2020).  Future Appointments  Date Time Provider Department Center  06/19/2020 10:15 AM 06/21/2020, DO [facility] None  06/20/2020  7:45 AM [facility]URSE [facility]  06/20/2020  8:00 AM [facility] US1 [facility]    06/22/2020, DO

## 2020-06-20 ENCOUNTER — Ambulatory Visit: Payer: No Typology Code available for payment source | Admitting: *Deleted

## 2020-06-20 ENCOUNTER — Encounter: Payer: Self-pay | Admitting: *Deleted

## 2020-06-20 ENCOUNTER — Ambulatory Visit: Payer: No Typology Code available for payment source | Attending: Obstetrics

## 2020-06-20 ENCOUNTER — Other Ambulatory Visit: Payer: Self-pay | Admitting: *Deleted

## 2020-06-20 VITALS — BP 122/66 | HR 99

## 2020-06-20 DIAGNOSIS — Z3A28 28 weeks gestation of pregnancy: Secondary | ICD-10-CM

## 2020-06-20 DIAGNOSIS — E669 Obesity, unspecified: Secondary | ICD-10-CM | POA: Diagnosis not present

## 2020-06-20 DIAGNOSIS — O99213 Obesity complicating pregnancy, third trimester: Secondary | ICD-10-CM | POA: Diagnosis not present

## 2020-06-20 DIAGNOSIS — O09523 Supervision of elderly multigravida, third trimester: Secondary | ICD-10-CM | POA: Diagnosis not present

## 2020-06-20 DIAGNOSIS — R638 Other symptoms and signs concerning food and fluid intake: Secondary | ICD-10-CM | POA: Diagnosis not present

## 2020-06-20 DIAGNOSIS — Z6834 Body mass index (BMI) 34.0-34.9, adult: Secondary | ICD-10-CM

## 2020-06-20 DIAGNOSIS — Z363 Encounter for antenatal screening for malformations: Secondary | ICD-10-CM

## 2020-06-20 LAB — CBC
Hematocrit: 36.2 % (ref 34.0–46.6)
Hemoglobin: 12.4 g/dL (ref 11.1–15.9)
MCH: 32.3 pg (ref 26.6–33.0)
MCHC: 34.3 g/dL (ref 31.5–35.7)
MCV: 94 fL (ref 79–97)
Platelets: 355 10*3/uL (ref 150–450)
RBC: 3.84 x10E6/uL (ref 3.77–5.28)
RDW: 12.9 % (ref 11.7–15.4)
WBC: 14.3 10*3/uL — ABNORMAL HIGH (ref 3.4–10.8)

## 2020-06-20 LAB — HIV ANTIBODY (ROUTINE TESTING W REFLEX): HIV Screen 4th Generation wRfx: NONREACTIVE

## 2020-06-20 LAB — GLUCOSE TOLERANCE, 2 HOURS W/ 1HR
Glucose, 1 hour: 187 mg/dL — ABNORMAL HIGH (ref 65–179)
Glucose, 2 hour: 143 mg/dL (ref 65–152)
Glucose, Fasting: 99 mg/dL — ABNORMAL HIGH (ref 65–91)

## 2020-06-20 LAB — RPR: RPR Ser Ql: NONREACTIVE

## 2020-06-21 MED ORDER — ACCU-CHEK SMARTVIEW VI STRP: ORAL_STRIP | 12 refills | Status: DC

## 2020-06-21 MED ORDER — ACCU-CHEK NANO SMARTVIEW W/DEVICE KIT: 1.0000 | PACK | 0 refills | Status: DC

## 2020-06-21 MED ORDER — ACCU-CHEK SOFTCLIX LANCETS MISC: 1.0000 | Freq: Four times a day (QID) | 12 refills | Status: DC

## 2020-06-21 NOTE — Addendum Note (Signed)
Addended by: Levie Heritage on: 06/21/2020 11:54 AM   Modules accepted: Orders

## 2020-06-25 ENCOUNTER — Encounter: Payer: No Typology Code available for payment source | Attending: Family Medicine | Admitting: Registered"

## 2020-06-25 DIAGNOSIS — Z3A00 Weeks of gestation of pregnancy not specified: Secondary | ICD-10-CM | POA: Diagnosis not present

## 2020-06-25 DIAGNOSIS — O24419 Gestational diabetes mellitus in pregnancy, unspecified control: Secondary | ICD-10-CM | POA: Diagnosis not present

## 2020-06-26 ENCOUNTER — Ambulatory Visit: Payer: No Typology Code available for payment source | Admitting: Registered"

## 2020-06-26 ENCOUNTER — Other Ambulatory Visit: Payer: Self-pay

## 2020-06-26 DIAGNOSIS — O24419 Gestational diabetes mellitus in pregnancy, unspecified control: Secondary | ICD-10-CM | POA: Insufficient documentation

## 2020-06-26 NOTE — Progress Notes (Signed)
Patient was seen on 06/26/20 for Gestational Diabetes self-management. EDD 09/10/20; [redacted]w[redacted]d . Patient states no history of GDM. Diet history obtained. Patient eats variety of all food groups. Beverages include water, milk.    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 Which throws off eating schedule because 30 min lunch break is at 2:15 pm and her son is ready to eat dinner at 4 pm after she picks him up from school.  The following learning objectives were met by the patient :   States the definition of Gestational Diabetes  States why dietary management is important in controlling blood glucose  Describes the effects of carbohydrates on blood glucose levels  Demonstrates ability to create a balanced meal plan  Demonstrates carbohydrate counting   States when to check blood glucose levels  Demonstrates proper blood glucose monitoring techniques  Next visit need to cover more detail on general healthy eating and...  States the effect of stress and exercise on blood glucose levels  States the importance of limiting caffeine and abstaining from alcohol and smoking  Plan:  Aim for 3 Carbohydrate Choices per meal (45 grams) +/- 1 either way  Aim for 1-2 Carbohydrate Choices per snack Begin reading food labels for Total Carbohydrate of foods If OK with your MD, consider  increasing your activity level by walking, Arm Chair Exercises or other activity daily as tolerated Begin checking Blood Glucose before breakfast and 2 hours after first bite of breakfast, lunch and dinner as directed by MD  Bring Log Book/Sheet and meter to every medical appointment  Take medication if directed by MD  Blood glucose monitor given: patient brought meter to appt CBG: 113 mg/dL  Patient instructed to monitor glucose levels: FBS: 60 - 95 mg/dl 1 hour: <140 mg/dl  Patient received the following handouts:  Nutrition Diabetes and Pregnancy  Carbohydrate Counting List  Blood glucose Log  Sheet  Patient will be seen for follow-up in 2 weeks or as needed.

## 2020-07-01 ENCOUNTER — Other Ambulatory Visit: Payer: Self-pay | Admitting: *Deleted

## 2020-07-01 ENCOUNTER — Other Ambulatory Visit: Payer: Self-pay

## 2020-07-01 DIAGNOSIS — O24419 Gestational diabetes mellitus in pregnancy, unspecified control: Secondary | ICD-10-CM

## 2020-07-03 ENCOUNTER — Other Ambulatory Visit: Payer: Self-pay

## 2020-07-03 ENCOUNTER — Ambulatory Visit (INDEPENDENT_AMBULATORY_CARE_PROVIDER_SITE_OTHER): Payer: No Typology Code available for payment source | Admitting: Family Medicine

## 2020-07-03 VITALS — BP 109/64 | HR 109 | Wt 254.0 lb

## 2020-07-03 DIAGNOSIS — Z3480 Encounter for supervision of other normal pregnancy, unspecified trimester: Secondary | ICD-10-CM

## 2020-07-03 DIAGNOSIS — O24419 Gestational diabetes mellitus in pregnancy, unspecified control: Secondary | ICD-10-CM

## 2020-07-03 DIAGNOSIS — Z3A30 30 weeks gestation of pregnancy: Secondary | ICD-10-CM

## 2020-07-03 DIAGNOSIS — O09529 Supervision of elderly multigravida, unspecified trimester: Secondary | ICD-10-CM

## 2020-07-03 DIAGNOSIS — R030 Elevated blood-pressure reading, without diagnosis of hypertension: Secondary | ICD-10-CM

## 2020-07-03 NOTE — Progress Notes (Signed)
   PRENATAL VISIT NOTE  Subjective:  Jody Bryan is a 40 y.o. G2P1001 at [redacted]w[redacted]d being seen today for ongoing prenatal care.  She is currently monitored for the following issues for this high-risk pregnancy and has Toenail fungus; Meralgia paraesthetica; Right wrist pain; Low back pain; Other fatigue; Pre-hypertension; Vitamin D deficiency; Prediabetes; Preventative health care; Renal cyst; Bulging lumbar disc; Supervision of other normal pregnancy, antepartum; Antepartum multigravida of advanced maternal age; and Gestational diabetes mellitus (GDM), antepartum on their problem list.  Patient reports no complaints.  Contractions: Not present. Vag. Bleeding: None.  Movement: Present. Denies leaking of fluid.   The following portions of the patient's history were reviewed and updated as appropriate: allergies, current medications, past family history, past medical history, past social history, past surgical history and problem list.   Objective:   Vitals:   07/03/20 0909  BP: 109/64  Pulse: (!) 109  Weight: 254 lb (115.2 kg)    Fetal Status:     Movement: Present     General:  Alert, oriented and cooperative. Patient is in no acute distress.  Skin: Skin is warm and dry. No rash noted.   Cardiovascular: Normal heart rate noted  Respiratory: Normal respiratory effort, no problems with respiration noted  Abdomen: Soft, gravid, appropriate for gestational age.  Pain/Pressure: Absent     Pelvic: Cervical exam deferred        Extremities: Normal range of motion.  Edema: None  Mental Status: Normal mood and affect. Normal behavior. Normal judgment and thought content.   Assessment and Plan:  Pregnancy: G2P1001 at [redacted]w[redacted]d 1. [redacted] weeks gestation of pregnancy  2. Supervision of other normal pregnancy, antepartum FHT and FH normal  3. Gestational diabetes mellitus (GDM), antepartum, gestational diabetes method of control unspecified Seen diabetes education. Has checked CBGs over past 4-5  days. Some elevated CBGs - better yesterday with following diet. Will continue to watch over next couple of weeks [name] for growth at 36 weeks   4. Pre-hypertension  5. Antepartum multigravida of advanced maternal age  On ASA 81mg .  Preterm labor symptoms and general obstetric precautions including but not limited to vaginal bleeding, contractions, leaking of fluid and fetal movement were reviewed in detail with the patient. Please refer to After Visit Summary for other counseling recommendations.   No follow-ups on file.  Future Appointments  Date Time Provider Department Center  07/10/2020  8:15 AM [facility]  07/17/2020  9:00 AM 07/19/2020, DO [facility] None  07/18/2020  9:15 AM [facility] NURSE [facility]  07/18/2020  9:30 AM [facility] US3 [facility]  08/01/2020  9:00 AM 10/01/2020, DO [facility] None  08/14/2020  9:00 AM 08/16/2020, DO [facility] None  08/22/2020  9:00 AM 08/24/2020, DO [facility] None  08/29/2020  9:00 AM 08/31/2020, DO [facility] None    Levie Heritage, DO

## 2020-07-04 ENCOUNTER — Encounter: Payer: Self-pay | Admitting: General Practice

## 2020-07-09 ENCOUNTER — Telehealth: Payer: Self-pay
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
## 2020-07-10 ENCOUNTER — Other Ambulatory Visit: Payer: No Typology Code available for payment source

## 2020-07-17 ENCOUNTER — Other Ambulatory Visit: Payer: Self-pay

## 2020-07-17 ENCOUNTER — Ambulatory Visit (INDEPENDENT_AMBULATORY_CARE_PROVIDER_SITE_OTHER): Payer: No Typology Code available for payment source | Admitting: Family Medicine

## 2020-07-17 VITALS — BP 135/79 | Wt 252.0 lb

## 2020-07-17 DIAGNOSIS — Z3480 Encounter for supervision of other normal pregnancy, unspecified trimester: Secondary | ICD-10-CM

## 2020-07-17 DIAGNOSIS — R030 Elevated blood-pressure reading, without diagnosis of hypertension: Secondary | ICD-10-CM

## 2020-07-17 DIAGNOSIS — O09529 Supervision of elderly multigravida, unspecified trimester: Secondary | ICD-10-CM

## 2020-07-17 DIAGNOSIS — Z3A32 32 weeks gestation of pregnancy: Secondary | ICD-10-CM

## 2020-07-17 DIAGNOSIS — O24419 Gestational diabetes mellitus in pregnancy, unspecified control: Secondary | ICD-10-CM

## 2020-07-17 NOTE — Progress Notes (Signed)
   PRENATAL VISIT NOTE  Subjective:  Jody Bryan is a 40 y.o. G2P1001 at [redacted]w[redacted]d being seen today for ongoing prenatal care.  She is currently monitored for the following issues for this high-risk pregnancy and has Toenail fungus; Meralgia paraesthetica; Right wrist pain; Low back pain; Other fatigue; Pre-hypertension; Vitamin D deficiency; Prediabetes; Preventative health care; Renal cyst; Bulging lumbar disc; Supervision of other normal pregnancy, antepartum; Antepartum multigravida of advanced maternal age; and Gestational diabetes mellitus (GDM), antepartum on their problem list.  Patient reports no complaints.  Contractions: Not present. Vag. Bleeding: None.  Movement: Present. Denies leaking of fluid.   The following portions of the patient's history were reviewed and updated as appropriate: allergies, current medications, past family history, past medical history, past social history, past surgical history and problem list.   Objective:   Vitals:   07/17/20 0911  BP: 135/79  Weight: 252 lb (114.3 kg)    Fetal Status: Fetal Heart Rate (bpm): 143   Movement: Present     General:  Alert, oriented and cooperative. Patient is in no acute distress.  Skin: Skin is warm and dry. No rash noted.   Cardiovascular: Normal heart rate noted  Respiratory: Normal respiratory effort, no problems with respiration noted  Abdomen: Soft, gravid, appropriate for gestational age.  Pain/Pressure: Present     Pelvic: Cervical exam deferred        Extremities: Normal range of motion.  Edema: Trace  Mental Status: Normal mood and affect. Normal behavior. Normal judgment and thought content.   Assessment and Plan:  Pregnancy: G2P1001 at [redacted]w[redacted]d 1. [redacted] weeks gestation of pregnancy  2. Supervision of other normal pregnancy, antepartum FHT normal.   3. Gestational diabetes mellitus (GDM), antepartum, gestational diabetes method of control unspecified Brought meter - CBGs reviewed. No consistently  taking. Discussed importance of checking consistently. Overall, well controlled. Checking 1hr PP  4. Antepartum multigravida of advanced maternal age ASA 81mg   5. Pre-hypertension BP controlled.  Preterm labor symptoms and general obstetric precautions including but not limited to vaginal bleeding, contractions, leaking of fluid and fetal movement were reviewed in detail with the patient. Please refer to After Visit Summary for other counseling recommendations.   No follow-ups on file.  Future Appointments  Date Time Provider Department Center  07/18/2020  9:15 AM [facility] NURSE [facility]  07/18/2020  9:30 AM [facility] US3 [facility]  07/30/2020  8:15 AM [facility]  08/01/2020  9:00 AM 10/01/2020, DO [facility] None  08/14/2020  9:00 AM 08/16/2020, DO [facility] None  08/22/2020  9:00 AM 08/24/2020, DO [facility] None  08/29/2020  9:00 AM 08/31/2020, DO [facility] None    Levie Heritage, DO

## 2020-07-18 ENCOUNTER — Other Ambulatory Visit: Payer: Self-pay | Admitting: Maternal & Fetal Medicine

## 2020-07-18 ENCOUNTER — Encounter: Payer: Self-pay | Admitting: *Deleted

## 2020-07-18 ENCOUNTER — Ambulatory Visit: Payer: No Typology Code available for payment source | Attending: Obstetrics

## 2020-07-18 ENCOUNTER — Ambulatory Visit: Payer: No Typology Code available for payment source | Admitting: *Deleted

## 2020-07-18 VITALS — BP 133/78 | HR 102

## 2020-07-18 DIAGNOSIS — Z3A32 32 weeks gestation of pregnancy: Secondary | ICD-10-CM | POA: Insufficient documentation

## 2020-07-18 DIAGNOSIS — O09523 Supervision of elderly multigravida, third trimester: Secondary | ICD-10-CM

## 2020-07-18 DIAGNOSIS — O99213 Obesity complicating pregnancy, third trimester: Secondary | ICD-10-CM | POA: Insufficient documentation

## 2020-07-18 DIAGNOSIS — Z6834 Body mass index (BMI) 34.0-34.9, adult: Secondary | ICD-10-CM

## 2020-07-18 DIAGNOSIS — E669 Obesity, unspecified: Secondary | ICD-10-CM | POA: Diagnosis not present

## 2020-07-30 ENCOUNTER — Other Ambulatory Visit: Payer: No Typology Code available for payment source

## 2020-08-01 ENCOUNTER — Ambulatory Visit (INDEPENDENT_AMBULATORY_CARE_PROVIDER_SITE_OTHER): Payer: No Typology Code available for payment source | Admitting: Family Medicine

## 2020-08-01 ENCOUNTER — Other Ambulatory Visit: Payer: Self-pay

## 2020-08-01 VITALS — BP 118/72 | HR 97 | Wt 255.0 lb

## 2020-08-01 DIAGNOSIS — N281 Cyst of kidney, acquired: Secondary | ICD-10-CM

## 2020-08-01 DIAGNOSIS — O26899 Other specified pregnancy related conditions, unspecified trimester: Secondary | ICD-10-CM | POA: Diagnosis not present

## 2020-08-01 DIAGNOSIS — R030 Elevated blood-pressure reading, without diagnosis of hypertension: Secondary | ICD-10-CM

## 2020-08-01 DIAGNOSIS — O09529 Supervision of elderly multigravida, unspecified trimester: Secondary | ICD-10-CM

## 2020-08-01 DIAGNOSIS — Z3480 Encounter for supervision of other normal pregnancy, unspecified trimester: Secondary | ICD-10-CM

## 2020-08-01 DIAGNOSIS — Z3A34 34 weeks gestation of pregnancy: Secondary | ICD-10-CM

## 2020-08-01 DIAGNOSIS — O24419 Gestational diabetes mellitus in pregnancy, unspecified control: Secondary | ICD-10-CM

## 2020-08-01 DIAGNOSIS — R102 Pelvic and perineal pain: Secondary | ICD-10-CM

## 2020-08-01 LAB — POCT URINALYSIS DIPSTICK
Appearance: ABNORMAL
Bilirubin, UA: NEGATIVE
Glucose, UA: NEGATIVE
Ketones, UA: NEGATIVE
Nitrite, UA: NEGATIVE
Odor: POSITIVE
Protein, UA: NEGATIVE
Spec Grav, UA: 1.01 (ref 1.010–1.025)
Urobilinogen, UA: 0.2 E.U./dL
pH, UA: 6 (ref 5.0–8.0)

## 2020-08-01 NOTE — Progress Notes (Signed)
+   Fetal movement. Pt states she is having some pressure. Will obtain urine to r/o urinary issue.

## 2020-08-01 NOTE — Progress Notes (Signed)
   PRENATAL VISIT NOTE  Subjective:  Jody Bryan is a 40 y.o. G2P1001 at [redacted]w[redacted]d being seen today for ongoing prenatal care.  She is currently monitored for the following issues for this high-risk pregnancy and has Toenail fungus; Meralgia paraesthetica; Right wrist pain; Low back pain; Other fatigue; Pre-hypertension; Vitamin D deficiency; Prediabetes; Preventative health care; Renal cyst; Bulging lumbar disc; Supervision of other normal pregnancy, antepartum; Antepartum multigravida of advanced maternal age; and Gestational diabetes mellitus (GDM), antepartum on their problem list.  Patient reports pelvic pressure.  Contractions: Not present. Vag. Bleeding: None.  Movement: Present. Denies leaking of fluid.   The following portions of the patient's history were reviewed and updated as appropriate: allergies, current medications, past family history, past medical history, past social history, past surgical history and problem list.   Objective:   Vitals:   08/01/20 0918  BP: 118/72  Pulse: 97  Weight: 255 lb (115.7 kg)    Fetal Status: Fetal Heart Rate (bpm): 155   Movement: Present     General:  Alert, oriented and cooperative. Patient is in no acute distress.  Skin: Skin is warm and dry. No rash noted.   Cardiovascular: Normal heart rate noted  Respiratory: Normal respiratory effort, no problems with respiration noted  Abdomen: Soft, gravid, appropriate for gestational age.  Pain/Pressure: Present     Pelvic: Cervical exam deferred        Extremities: Normal range of motion.  Edema: Trace  Mental Status: Normal mood and affect. Normal behavior. Normal judgment and thought content.   Assessment and Plan:  Pregnancy: G2P1001 at [redacted]w[redacted]d 1. [redacted] weeks gestation of pregnancy  2. Supervision of other normal pregnancy, antepartum FHT and FH normal  3. Gestational diabetes mellitus (GDM), antepartum, gestational diabetes method of control unspecified fastings controlled. Postprandial  controlled. Diet controlled. [name] at 36 weeks.  4. Antepartum multigravida of advanced maternal age Jonne Ply 81mg   5. Pre-hypertension BP controlled  6. Renal cyst  7. Pelvic pressure in pregnancy - POCT urinalysis dipstick - Urine Culture  Preterm labor symptoms and general obstetric precautions including but not limited to vaginal bleeding, contractions, leaking of fluid and fetal movement were reviewed in detail with the patient. Please refer to After Visit Summary for other counseling recommendations.   No follow-ups on file.  Future Appointments  Date Time Provider Department Center  08/14/2020  9:00 AM 08/16/2020, DO [facility] None  08/15/2020  7:15 AM [facility] NURSE [facility]  08/15/2020  7:30 AM [facility] US3 [facility]  08/22/2020  9:00 AM 08/24/2020, DO [facility] None  08/29/2020  9:00 AM 08/31/2020, DO [facility] None    Levie Heritage, DO

## 2020-08-03 LAB — URINE CULTURE

## 2020-08-14 ENCOUNTER — Other Ambulatory Visit (HOSPITAL_COMMUNITY)
Admission: RE | Admit: 2020-08-14 | Discharge: 2020-08-14 | Disposition: A | Discharge status: Home / Self Care | Payer: 59 | Source: Ambulatory Visit | Attending: Family Medicine | Admitting: Family Medicine

## 2020-08-14 ENCOUNTER — Ambulatory Visit (INDEPENDENT_AMBULATORY_CARE_PROVIDER_SITE_OTHER): Payer: 59 | Admitting: Family Medicine

## 2020-08-14 ENCOUNTER — Other Ambulatory Visit: Payer: Self-pay

## 2020-08-14 VITALS — BP 129/76 | HR 102 | Wt 262.0 lb

## 2020-08-14 DIAGNOSIS — Z3480 Encounter for supervision of other normal pregnancy, unspecified trimester: Secondary | ICD-10-CM

## 2020-08-14 DIAGNOSIS — O24419 Gestational diabetes mellitus in pregnancy, unspecified control: Secondary | ICD-10-CM

## 2020-08-14 DIAGNOSIS — Z3483 Encounter for supervision of other normal pregnancy, third trimester: Secondary | ICD-10-CM | POA: Insufficient documentation

## 2020-08-14 DIAGNOSIS — O09529 Supervision of elderly multigravida, unspecified trimester: Secondary | ICD-10-CM

## 2020-08-14 DIAGNOSIS — Z3A36 36 weeks gestation of pregnancy: Secondary | ICD-10-CM

## 2020-08-14 NOTE — Progress Notes (Signed)
Subjective:  Jody Bryan is a 40 y.o. G2P1001 at [redacted]w[redacted]d being seen today for ongoing prenatal care.  She is currently monitored for the following issues for this high-risk pregnancy and has Toenail fungus; Meralgia paraesthetica; Right wrist pain; Low back pain; Other fatigue; Pre-hypertension; Vitamin D deficiency; Prediabetes; Preventative health care; Renal cyst; Bulging lumbar disc; Supervision of other normal pregnancy, antepartum; Antepartum multigravida of advanced maternal age; and Gestational diabetes mellitus (GDM), antepartum on their problem list.  GDM: Patient Diet controlled.  Reports no hypoglycemic episodes.  Tolerating medication well Fasting: 85-95 2hr PP: < 120  Patient reports no complaints.  Contractions: Irritability. Vag. Bleeding: None.  Movement: Present. Denies leaking of fluid.   The following portions of the patient's history were reviewed and updated as appropriate: allergies, current medications, past family history, past medical history, past social history, past surgical history and problem list. Problem list updated.  Objective:   Vitals:   08/14/20 0910  BP: 129/76  Pulse: (!) 102  Weight: 262 lb (118.8 kg)    Fetal Status: Fetal Heart Rate (bpm): 152   Movement: Present     General:  Alert, oriented and cooperative. Patient is in no acute distress.  Skin: Skin is warm and dry. No rash noted.   Cardiovascular: Normal heart rate noted  Respiratory: Normal respiratory effort, no problems with respiration noted  Abdomen: Soft, gravid, appropriate for gestational age. Pain/Pressure: Present     Pelvic: Vag. Bleeding: None     Cervical exam performed        Extremities: Normal range of motion.  Edema: Trace  Mental Status: Normal mood and affect. Normal behavior. Normal judgment and thought content.   Urinalysis:      Assessment and Plan:  Pregnancy: G2P1001 at [redacted]w[redacted]d  1. [redacted] weeks gestation of pregnancy 2. Supervision of other normal pregnancy,  antepartum FHT normal  3. Gestational diabetes mellitus (GDM), antepartum, gestational diabetes method of control unspecified [name] tomorrow  4. Antepartum multigravida of advanced maternal age ASA 81mg   Preterm labor symptoms and general obstetric precautions including but not limited to vaginal bleeding, contractions, leaking of fluid and fetal movement were reviewed in detail with the patient. Please refer to After Visit Summary for other counseling recommendations.  No follow-ups on file.   , DO

## 2020-08-14 NOTE — Progress Notes (Signed)
+   fetal movement. Blood sugars are running well. PHQ 9: 5, GAD 7: 0

## 2020-08-15 ENCOUNTER — Ambulatory Visit: Payer: 59 | Attending: Maternal & Fetal Medicine

## 2020-08-15 ENCOUNTER — Other Ambulatory Visit: Payer: Self-pay | Admitting: *Deleted

## 2020-08-15 ENCOUNTER — Encounter: Payer: Self-pay | Admitting: *Deleted

## 2020-08-15 ENCOUNTER — Ambulatory Visit (HOSPITAL_BASED_OUTPATIENT_CLINIC_OR_DEPARTMENT_OTHER): Payer: 59 | Admitting: *Deleted

## 2020-08-15 ENCOUNTER — Ambulatory Visit: Payer: 59 | Admitting: *Deleted

## 2020-08-15 ENCOUNTER — Ambulatory Visit: Payer: 59

## 2020-08-15 VITALS — BP 141/71 | HR 99

## 2020-08-15 DIAGNOSIS — O09523 Supervision of elderly multigravida, third trimester: Secondary | ICD-10-CM | POA: Insufficient documentation

## 2020-08-15 DIAGNOSIS — O99213 Obesity complicating pregnancy, third trimester: Secondary | ICD-10-CM | POA: Insufficient documentation

## 2020-08-15 DIAGNOSIS — Z3A36 36 weeks gestation of pregnancy: Secondary | ICD-10-CM | POA: Diagnosis not present

## 2020-08-15 DIAGNOSIS — O24410 Gestational diabetes mellitus in pregnancy, diet controlled: Secondary | ICD-10-CM

## 2020-08-15 DIAGNOSIS — E669 Obesity, unspecified: Secondary | ICD-10-CM | POA: Diagnosis not present

## 2020-08-15 LAB — CERVICOVAGINAL ANCILLARY ONLY
Chlamydia: NEGATIVE
Comment: NEGATIVE
Comment: NEGATIVE
Comment: NORMAL
Neisseria Gonorrhea: NEGATIVE
Trichomonas: NEGATIVE

## 2020-08-15 NOTE — Procedures (Signed)
Jody Bryan 1980/03/14 [redacted]w[redacted]d  Fetus A Non-Stress Test Interpretation for 08/15/20  Indication:  AMA  Fetal Heart Rate A Mode: External Baseline Rate (A): 135 bpm Variability: Moderate Accelerations: 15 x 15 Decelerations: None Multiple birth?: No  Uterine Activity Mode: Palpation, Toco Contraction Frequency (min): none Resting Tone Palpated: Relaxed Resting Time: Adequate  Interpretation (Fetal Testing) Nonstress Test Interpretation: Reactive Overall Impression: Reassuring for gestational age Comments: Dr. Judeth Cornfield reviewed tracing.

## 2020-08-16 LAB — STREP GP B NAA: Strep Gp B NAA: NEGATIVE

## 2020-08-19 ENCOUNTER — Ambulatory Visit: Payer: 59 | Admitting: *Deleted

## 2020-08-19 ENCOUNTER — Other Ambulatory Visit: Payer: Self-pay

## 2020-08-19 ENCOUNTER — Ambulatory Visit: Payer: 59 | Attending: Obstetrics and Gynecology | Admitting: *Deleted

## 2020-08-19 VITALS — BP 139/82 | HR 101

## 2020-08-19 DIAGNOSIS — O09523 Supervision of elderly multigravida, third trimester: Secondary | ICD-10-CM

## 2020-08-19 DIAGNOSIS — O24410 Gestational diabetes mellitus in pregnancy, diet controlled: Secondary | ICD-10-CM | POA: Diagnosis not present

## 2020-08-19 DIAGNOSIS — Z3A36 36 weeks gestation of pregnancy: Secondary | ICD-10-CM

## 2020-08-19 NOTE — Procedures (Signed)
Jody Bryan 1980-12-10 [redacted]w[redacted]d  Fetus A Non-Stress Test Interpretation for 08/19/20  Indication: Diabetes, gestational-diet controlled  Fetal Heart Rate A Mode: External Baseline Rate (A): 140 bpm Variability: Moderate Accelerations: 15 x 15 Decelerations: None  Uterine Activity Mode: Palpation, Toco Contraction Frequency (min): Occas. Contraction Quality: Mild Resting Tone Palpated: Relaxed Resting Time: Adequate  Interpretation (Fetal Testing) Nonstress Test Interpretation: Reactive Comments: Dr. Parke Poisson reviewed tracing.

## 2020-08-22 ENCOUNTER — Ambulatory Visit: Payer: 59

## 2020-08-22 ENCOUNTER — Ambulatory Visit: Payer: 59 | Attending: Obstetrics and Gynecology | Admitting: *Deleted

## 2020-08-22 ENCOUNTER — Other Ambulatory Visit: Payer: 59

## 2020-08-22 ENCOUNTER — Encounter: Payer: Self-pay | Admitting: *Deleted

## 2020-08-22 ENCOUNTER — Ambulatory Visit (INDEPENDENT_AMBULATORY_CARE_PROVIDER_SITE_OTHER): Payer: 59 | Admitting: Family Medicine

## 2020-08-22 ENCOUNTER — Ambulatory Visit (HOSPITAL_BASED_OUTPATIENT_CLINIC_OR_DEPARTMENT_OTHER): Payer: 59

## 2020-08-22 ENCOUNTER — Other Ambulatory Visit: Payer: Self-pay | Admitting: Obstetrics and Gynecology

## 2020-08-22 ENCOUNTER — Other Ambulatory Visit: Payer: Self-pay

## 2020-08-22 VITALS — BP 133/85 | HR 108 | Wt 264.0 lb

## 2020-08-22 VITALS — BP 136/76 | HR 105

## 2020-08-22 DIAGNOSIS — O99213 Obesity complicating pregnancy, third trimester: Secondary | ICD-10-CM | POA: Insufficient documentation

## 2020-08-22 DIAGNOSIS — E669 Obesity, unspecified: Secondary | ICD-10-CM

## 2020-08-22 DIAGNOSIS — O3663X0 Maternal care for excessive fetal growth, third trimester, not applicable or unspecified: Secondary | ICD-10-CM | POA: Insufficient documentation

## 2020-08-22 DIAGNOSIS — O24419 Gestational diabetes mellitus in pregnancy, unspecified control: Secondary | ICD-10-CM

## 2020-08-22 DIAGNOSIS — O09523 Supervision of elderly multigravida, third trimester: Secondary | ICD-10-CM

## 2020-08-22 DIAGNOSIS — Z3A37 37 weeks gestation of pregnancy: Secondary | ICD-10-CM

## 2020-08-22 DIAGNOSIS — Z3480 Encounter for supervision of other normal pregnancy, unspecified trimester: Secondary | ICD-10-CM

## 2020-08-22 DIAGNOSIS — O24410 Gestational diabetes mellitus in pregnancy, diet controlled: Secondary | ICD-10-CM

## 2020-08-22 DIAGNOSIS — O09529 Supervision of elderly multigravida, unspecified trimester: Secondary | ICD-10-CM

## 2020-08-22 LAB — POCT URINALYSIS DIPSTICK OB
Bilirubin, UA: NEGATIVE
Blood, UA: NEGATIVE
Glucose, UA: NEGATIVE
Ketones, UA: NEGATIVE
Leukocytes, UA: NEGATIVE
Nitrite, UA: NEGATIVE
POC,PROTEIN,UA: NEGATIVE
Spec Grav, UA: 1.02 (ref 1.010–1.025)
Urobilinogen, UA: 0.2 E.U./dL
pH, UA: 6.5 (ref 5.0–8.0)

## 2020-08-22 NOTE — Progress Notes (Signed)
   PRENATAL VISIT NOTE  Subjective:  Jody Bryan is a 40 y.o. G2P1001 at [redacted]w[redacted]d being seen today for ongoing prenatal care.  She is currently monitored for the following issues for this high-risk pregnancy and has Toenail fungus; Meralgia paraesthetica; Right wrist pain; Low back pain; Other fatigue; Pre-hypertension; Vitamin D deficiency; Prediabetes; Preventative health care; Renal cyst; Bulging lumbar disc; Supervision of other normal pregnancy, antepartum; Antepartum multigravida of advanced maternal age; and Gestational diabetes mellitus (GDM), antepartum on their problem list.  Patient reports occasional contractions.  Contractions: Not present. Vag. Bleeding: None.  Movement: Present. Denies leaking of fluid.   The following portions of the patient's history were reviewed and updated as appropriate: allergies, current medications, past family history, past medical history, past social history, past surgical history and problem list.   Objective:   Vitals:   08/22/20 0917  BP: 133/85  Pulse: (!) 108  Weight: 264 lb (119.7 kg)    Fetal Status: Fetal Heart Rate (bpm): 136   Movement: Present     General:  Alert, oriented and cooperative. Patient is in no acute distress.  Skin: Skin is warm and dry. No rash noted.   Cardiovascular: Normal heart rate noted  Respiratory: Normal respiratory effort, no problems with respiration noted  Abdomen: Soft, gravid, appropriate for gestational age.  Pain/Pressure: Absent     Pelvic: Cervical exam deferred        Extremities: Normal range of motion.  Edema: Trace  Mental Status: Normal mood and affect. Normal behavior. Normal judgment and thought content.   Assessment and Plan:  Pregnancy: G2P1001 at [redacted]w[redacted]d 1. [redacted] weeks gestation of pregnancy - POC Urinalysis Dipstick OB  2. Supervision of other normal pregnancy, antepartum FHT and FH normal  3. Gestational diabetes mellitus (GDM), antepartum, gestational diabetes method of control  unspecified EFW 95%. Discussed possible delivery at 39 weeks - patient would prefer to wait until 40weeks. BPP weekly. CBGs controlled. - POC Urinalysis Dipstick OB  4. Antepartum multigravida of advanced maternal age On ASA 81mg   Term labor symptoms and general obstetric precautions including but not limited to vaginal bleeding, contractions, leaking of fluid and fetal movement were reviewed in detail with the patient. Please refer to After Visit Summary for other counseling recommendations.   No follow-ups on file.  Future Appointments  Date Time Provider Department Center  08/22/2020  3:00 PM [facility] NURSE [facility]  08/22/2020  3:45 PM [facility] US6 [facility]  08/29/2020  9:00 AM 08/31/2020, DO [facility] None  09/05/2020  9:00 AM 11/06/2020, DO [facility] None    Levie Heritage, DO

## 2020-08-26 ENCOUNTER — Other Ambulatory Visit: Payer: Self-pay | Admitting: Family Medicine

## 2020-08-27 ENCOUNTER — Inpatient Hospital Stay (HOSPITAL_COMMUNITY): Payer: 59

## 2020-08-27 ENCOUNTER — Other Ambulatory Visit: Payer: Self-pay

## 2020-08-27 ENCOUNTER — Encounter (HOSPITAL_COMMUNITY): Payer: Self-pay | Admitting: Family Medicine

## 2020-08-27 ENCOUNTER — Inpatient Hospital Stay (HOSPITAL_COMMUNITY)
Admission: AD | Admit: 2020-08-27 | Discharge: 2020-08-29 | DRG: 807 | Disposition: A | Discharge status: Home / Self Care | Payer: 59 | Attending: Family Medicine | Admitting: Family Medicine

## 2020-08-27 DIAGNOSIS — O139 Gestational [pregnancy-induced] hypertension without significant proteinuria, unspecified trimester: Secondary | ICD-10-CM | POA: Diagnosis not present

## 2020-08-27 DIAGNOSIS — Z7982 Long term (current) use of aspirin: Secondary | ICD-10-CM

## 2020-08-27 DIAGNOSIS — Z3490 Encounter for supervision of normal pregnancy, unspecified, unspecified trimester: Secondary | ICD-10-CM | POA: Diagnosis present

## 2020-08-27 DIAGNOSIS — O24419 Gestational diabetes mellitus in pregnancy, unspecified control: Secondary | ICD-10-CM | POA: Diagnosis present

## 2020-08-27 DIAGNOSIS — Z20822 Contact with and (suspected) exposure to covid-19: Secondary | ICD-10-CM | POA: Diagnosis not present

## 2020-08-27 DIAGNOSIS — Z87891 Personal history of nicotine dependence: Secondary | ICD-10-CM | POA: Diagnosis not present

## 2020-08-27 DIAGNOSIS — O4202 Full-term premature rupture of membranes, onset of labor within 24 hours of rupture: Secondary | ICD-10-CM | POA: Diagnosis not present

## 2020-08-27 DIAGNOSIS — O09529 Supervision of elderly multigravida, unspecified trimester: Secondary | ICD-10-CM

## 2020-08-27 DIAGNOSIS — O134 Gestational [pregnancy-induced] hypertension without significant proteinuria, complicating childbirth: Secondary | ICD-10-CM | POA: Diagnosis not present

## 2020-08-27 DIAGNOSIS — O3663X0 Maternal care for excessive fetal growth, third trimester, not applicable or unspecified: Principal | ICD-10-CM | POA: Diagnosis present

## 2020-08-27 DIAGNOSIS — R7303 Prediabetes: Secondary | ICD-10-CM | POA: Diagnosis present

## 2020-08-27 DIAGNOSIS — O24420 Gestational diabetes mellitus in childbirth, diet controlled: Secondary | ICD-10-CM | POA: Diagnosis not present

## 2020-08-27 DIAGNOSIS — Z3A38 38 weeks gestation of pregnancy: Secondary | ICD-10-CM | POA: Diagnosis not present

## 2020-08-27 LAB — CBC
HCT: 38.4 % (ref 36.0–46.0)
Hemoglobin: 13.4 g/dL (ref 12.0–15.0)
MCH: 33 pg (ref 26.0–34.0)
MCHC: 34.9 g/dL (ref 30.0–36.0)
MCV: 94.6 fL (ref 80.0–100.0)
Platelets: 325 10*3/uL (ref 150–400)
RBC: 4.06 MIL/uL (ref 3.87–5.11)
RDW: 13.9 % (ref 11.5–15.5)
WBC: 12.2 10*3/uL — ABNORMAL HIGH (ref 4.0–10.5)
nRBC: 0.2 % (ref 0.0–0.2)

## 2020-08-27 LAB — TYPE AND SCREEN
ABO/RH(D): O POS
Antibody Screen: NEGATIVE

## 2020-08-27 LAB — COMPREHENSIVE METABOLIC PANEL
ALT: 18 U/L (ref 0–44)
AST: 21 U/L (ref 15–41)
Albumin: 2.6 g/dL — ABNORMAL LOW (ref 3.5–5.0)
Alkaline Phosphatase: 143 U/L — ABNORMAL HIGH (ref 38–126)
Anion gap: 15 (ref 5–15)
BUN: 7 mg/dL (ref 6–20)
CO2: 18 mmol/L — ABNORMAL LOW (ref 22–32)
Calcium: 9.6 mg/dL (ref 8.9–10.3)
Chloride: 102 mmol/L (ref 98–111)
Creatinine, Ser: 0.57 mg/dL (ref 0.44–1.00)
GFR, Estimated: >60 mL/min (ref >60)
Glucose, Bld: 113 mg/dL — ABNORMAL HIGH (ref 70–99)
Potassium: 3.8 mmol/L (ref 3.5–5.1)
Sodium: 135 mmol/L (ref 135–145)
Total Bilirubin: 0.7 mg/dL (ref 0.3–1.2)
Total Protein: 5.9 g/dL — ABNORMAL LOW (ref 6.5–8.1)

## 2020-08-27 LAB — PROTEIN / CREATININE RATIO, URINE
Creatinine, Urine: 85.66 mg/dL
Protein Creatinine Ratio: 0.13 mg/mg{Cre} (ref 0.00–0.15)
Total Protein, Urine: 11 mg/dL

## 2020-08-27 LAB — GLUCOSE, CAPILLARY
Glucose-Capillary: 111 mg/dL — ABNORMAL HIGH (ref 70–99)
Glucose-Capillary: 91 mg/dL (ref 70–99)
Glucose-Capillary: 81 mg/dL (ref 70–99)
Glucose-Capillary: 79 mg/dL (ref 70–99)

## 2020-08-27 LAB — RPR: RPR Ser Ql: NONREACTIVE

## 2020-08-27 LAB — SARS CORONAVIRUS 2 (TAT 6-24 HRS): SARS Coronavirus 2: NEGATIVE

## 2020-08-27 MED ORDER — OXYTOCIN BOLUS FROM INFUSION
333.0000 mL | Freq: Once | INTRAVENOUS | Status: AC
  Administered 2020-08-28: 333 mL via INTRAVENOUS

## 2020-08-27 MED ORDER — LACTATED RINGERS IV SOLN: 500.0000 mL | INTRAVENOUS | Status: DC | PRN

## 2020-08-27 MED ORDER — SOD CITRATE-CITRIC ACID 500-334 MG/5ML PO SOLN: 30.0000 mL | ORAL | Status: DC | PRN

## 2020-08-27 MED ORDER — OXYTOCIN-SODIUM CHLORIDE 30-0.9 UT/500ML-% IV SOLN: 1.0000 m[IU]/min | INTRAVENOUS | Status: DC

## 2020-08-27 MED ORDER — OXYCODONE-ACETAMINOPHEN 5-325 MG PO TABS: 1.0000 | ORAL_TABLET | ORAL | Status: DC | PRN

## 2020-08-27 MED ORDER — ONDANSETRON HCL 4 MG/2ML IJ SOLN: 4.0000 mg | Freq: Four times a day (QID) | INTRAMUSCULAR | Status: DC | PRN

## 2020-08-27 MED ORDER — OXYTOCIN-SODIUM CHLORIDE 30-0.9 UT/500ML-% IV SOLN
2.5000 [IU]/h | INTRAVENOUS | Status: DC
  Filled 2020-08-27: qty 500

## 2020-08-27 MED ORDER — TERBUTALINE SULFATE 1 MG/ML IJ SOLN: 0.2500 mg | Freq: Once | INTRAMUSCULAR | Status: DC | PRN

## 2020-08-27 MED ORDER — MISOPROSTOL 50MCG HALF TABLET
50.0000 ug | ORAL_TABLET | ORAL | Status: DC
  Administered 2020-08-27 (×2): 50 ug via BUCCAL
  Filled 2020-08-27 (×2): qty 1

## 2020-08-27 MED ORDER — OXYCODONE-ACETAMINOPHEN 5-325 MG PO TABS: 2.0000 | ORAL_TABLET | ORAL | Status: DC | PRN

## 2020-08-27 MED ORDER — ACETAMINOPHEN 325 MG PO TABS: 650.0000 mg | ORAL_TABLET | ORAL | Status: DC | PRN

## 2020-08-27 MED ORDER — LACTATED RINGERS IV SOLN: INTRAVENOUS | Status: DC

## 2020-08-27 MED ORDER — LIDOCAINE HCL (PF) 1 % IJ SOLN
30.0000 mL | INTRAMUSCULAR | Status: DC | PRN
  Filled 2020-08-27: qty 30

## 2020-08-27 NOTE — Progress Notes (Signed)
Labor Progress Note Jody Bryan is a 40 y.o. G2P1001 at [redacted]w[redacted]d presented for IOL-fetal macrosomia, A1GDM.  S: Pt reporting increasing discomfort with contractions. No other concerns at this time.  O:  BP 123/65   Pulse 99   Temp 97.6 F (36.4 C) (Oral)   Resp 18   Ht 5\' 7"  (1.702 m)   Wt 123.2 kg   LMP 12/05/2019   BMI 42.54 kg/m  EFM: baseline 125/moderate variability/+accels/single variable decel Toco: every 2-3 min  CVE: Dilation: 8 Effacement (%): 80 Cervical Position: Anterior Station: 0 Presentation: Vertex Exam by:: Dr. [email or web address]   A&P: 40 y.o. G2P1001 [redacted]w[redacted]d IOL-fetal macrosomia, A1GDM. #IOL: Progressing well. S/p Cytotec x2 and Cook's catheter. SROM at approximately 2300. Will continue to monitor and start pitocin if contraction pattern spaces out. #FWB: cat 2 strip given single variable decel #GBS negative #A1GDM/LGA: EFW 95%ile @[redacted]w[redacted]d , 3486g. Most recent CBG 79. Continue q2hr BG checks in active labor. #Elevated BP: no diagnosis, asymptomatic, does not meet criteria. PreE labs unremarkable. Continue to monitor.  [redacted]w[redacted]d, MD 11:31 PM

## 2020-08-27 NOTE — Progress Notes (Signed)
Labor Progress Note [name] is a 40 y.o. G2P1001 at [redacted]w[redacted]d presented for IOL-fetal macrosomia, A1GDM. S: Doing well without complaints, napping.  O:  BP 111/68   Pulse 94   Temp 97.6 F (36.4 C) (Oral)   Resp 18   Ht 5\' 7"  (1.702 m)   Wt 123.2 kg   LMP 12/05/2019   BMI 42.54 kg/m  EFM: baseline 150bpm/mod variability/+accels/no decels Toco: q2-5 min  CVE: Dilation: 1 Effacement (%): Thick Station: -3 Presentation: Vertex Exam by:: [email or web address] MD   A&P: 40 y.o. G2P1001 [redacted]w[redacted]d presented for IOL-fetal macrosomia, A1GDM. #IOL: S/p cyto x1. Given cervical exam, attempted cooks catheter placement, unsuccessful. Will re-dose cytotec and re-attempt at next check. #Pain: PRN #FWB: cat 1 #GBS negative #A1GDM/LGA: EFW 95%ile @[redacted]w[redacted]d , 3486g. Q4 BGL in latent labor, q2 in active labor. #Elevated BP: no diagnosis, asymptomatic, does not meet criteria. PreE labs unremarkable. Continue to monitor.    [redacted]w[redacted]d, MD 1:08 PM

## 2020-08-27 NOTE — Progress Notes (Addendum)
Labor Progress Note Jody Bryan is a 40 y.o. G2P1001 at [redacted]w[redacted]d presented for IOL-fetal macrosomia, A1GDM. S: Comfortable, was ambulating and doing lunges in the hallway earlier.  O:  BP 136/62   Pulse 97   Temp 97.6 F (36.4 C) (Oral)   Resp 18   Ht 5\' 7"  (1.702 m)   Wt 123.2 kg   LMP 12/05/2019   BMI 42.54 kg/m  EFM: baseline 140/moderate variability/+accels/no decels Toco: every 2-4 min  CVE: Dilation: 1 Effacement (%): Thick Station: -3 Presentation: Vertex Exam by:: [email or web address] MD   A&P: 40 y.o. G2P1001 [redacted]w[redacted]d IOL-fetal macrosomia, A1GDM. #IOL: S/p Cytotec x2. Cooks catheter successfully placed, will plan to re-dose Cytotec at the 4 hour mark around 1700. #Pain: per patient request #FWB: cat I #GBS negative #A1GDM/LGA: EFW 95%ile @[redacted]w[redacted]d , 3486g. Most recent CBG 91. Q4 BGL in latent labor, q2 in active labor. #Elevated BP: no diagnosis, asymptomatic, does not meet criteria. PreE labs unremarkable. Continue to monitor.  [redacted]w[redacted]d, MD 4:45 PM   GME ATTESTATION:  I saw and evaluated the patient. I agree with the findings and the plan of care as documented in the resident's note.  , MD OB Fellow, [facility], Center for [facility] 08/27/2020 5:04 PM

## 2020-08-27 NOTE — H&P (Signed)
OBSTETRIC ADMISSION HISTORY AND PHYSICAL  Jody Bryan is a 40 y.o. female G2P1001 with IUP at 68w0dby LMP presenting for IOL-fetal macrosomia, A1GDM. She reports +FMs, No LOF, no VB, no blurry vision, headaches or peripheral edema, and RUQ pain.  She plans on breast feeding. She request BTL for birth control, consent signed 07/17/20. [facility]   Dating: By LMP --->  Estimated Date of Delivery: 09/10/20  Sono:    08/15/20_0 , CWD, normal anatomy, cephalic presentation, anterior fundal placental lie, 3486g, 95% EFW   Prenatal History/Complications: LGA AI4PYKElevated BP (no diagnosis) BMI 433AMA  Past Medical History: Past Medical History:  Diagnosis Date   Bilateral leg edema    Chicken pox    Depression    Drug use    Dyspnea    GERD (gastroesophageal reflux disease)    Low back pain     Past Surgical History: Past Surgical History:  Procedure Laterality Date   WISDOM TOOTH EXTRACTION  08/2019    Obstetrical History: OB History     Gravida  2   Para  1   Term  1   Preterm      AB      Living  1      SAB      IAB      Ectopic      Multiple      Live Births  1           Social History Social History   Socioeconomic History   Marital status: Single    Spouse name: Not on file   Number of children: 1   Years of education: Not on file   Highest education level: Not on file  Occupational History   Occupation:  call center  Tobacco Use   Smoking status: Former    Years: 2.00    Pack years: 0.00    Types: Cigarettes   Smokeless tobacco: Never  Vaping Use   Vaping Use: Never used  Substance and Sexual Activity   Alcohol use: Yes    Alcohol/week: 0.0 standard drinks    Comment: social   Drug use: Not Currently   Sexual activity: Yes  Other Topics Concern   Not on file  Social History Narrative   Not on file   Social Determinants of Health   Financial Resource Strain: Not on file  Food  Insecurity: Not on file  Transportation Needs: Not on file  Physical Activity: Not on file  Stress: Not on file  Social Connections: Not on file    Family History: Family History  Problem Relation Age of Onset   Hypertension Mother    Obesity Mother    Hypertension Father    Depression Father    Sleep apnea Father    Drug abuse Father    Obesity Father    Arthritis Paternal Grandmother    Cancer Paternal Grandmother        breast   Alcohol abuse Sister    Stroke Sister    Heart disease Maternal Grandmother    Alcohol abuse Maternal Grandfather     Allergies: No Known Allergies  Medications Prior to Admission  Medication Sig Dispense Refill Last Dose   aspirin EC 81 MG tablet Take 81 mg by mouth daily. Swallow whole.   08/26/2020   Melatonin 1 MG CAPS Take by mouth.   Past Week   Prenatal Vit-Fe Fumarate-FA (PRENATAL MULTIVITAMIN) TABS tablet Take 1  tablet by mouth daily at 12 noon.   08/26/2020   Accu-Chek Softclix Lancets lancets 1 each by Other route 4 (four) times daily. 100 each 12    Blood Glucose Monitoring Suppl (ACCU-CHEK NANO SMARTVIEW) w/Device KIT 1 kit by Subdermal route as directed. Check blood sugars for fasting, and two hours after breakfast, lunch and dinner (4 checks daily) 1 kit 0    glucose blood (ACCU-CHEK SMARTVIEW) test strip Use as instructed to check blood sugars 100 each 12      Review of Systems   All systems reviewed and negative except as stated in HPI  Blood pressure 131/85, pulse (!) 114, temperature 97.6 F (36.4 C), temperature source Oral, resp. rate 18, height _0  (1.702 m), weight 123.2 kg, last menstrual period 12/05/2019. General appearance: alert, cooperative, and no distress Lungs: normal respiratory effort Heart: regular rate and rhythm Abdomen: soft, non-tender Pelvic: as noted below Extremities: Homans sign is negative, no sign of DVT Presentation: cephalic by cervical exam Fetal monitoringBaseline: 130 bpm, Variability:  Good {> 6 bpm), Accelerations: Reactive, and Decelerations: Absent Uterine activityFrequency: Every 4-5 minutes Dilation: Fingertip Effacement (%): Thick Station: -3 Exam by:: Sylvester Harder MD   Prenatal labs: ABO, Rh: --/--/PENDING (06/28 0745) Antibody: PENDING (06/28 0745) Rubella: 1.32 (12/23 1038) RPR: Non Reactive (04/20 0829)  HBsAg: Negative (12/23 1038)  HIV: Non Reactive (04/20 0829)  GBS: Negative/-- (06/15 0959)  2 hr Glucola passed Genetic screening  normal Anatomy US possible VSD, otherwise normal  Prenatal Transfer Tool  Maternal Diabetes: Yes:  Diabetes Type:  Diet controlled Genetic Screening: Normal Maternal Ultrasounds/Referrals: Cardiac defect Fetal Ultrasounds or other Referrals:  Referred to Materal Fetal Medicine  Maternal Substance Abuse:  No Significant Maternal Medications:  None Significant Maternal Lab Results: Group B Strep negative  Results for orders placed or performed during the hospital encounter of 08/27/20 (from the past 24 hour(s))  CBC   Collection Time: 08/27/20  7:45 AM  Result Value Ref Range   WBC 12.2 (H) 4.0 - 10.5 K/uL   RBC 4.06 3.87 - 5.11 MIL/uL   Hemoglobin 13.4 12.0 - 15.0 g/dL   HCT 38.4 36.0 - 46.0 %   MCV 94.6 80.0 - 100.0 fL   MCH 33.0 26.0 - 34.0 pg   MCHC 34.9 30.0 - 36.0 g/dL   RDW 13.9 11.5 - 15.5 %   Platelets 325 150 - 400 K/uL   nRBC 0.2 0.0 - 0.2 %  Type and screen   Collection Time: 08/27/20  7:45 AM  Result Value Ref Range   ABO/RH(D) PENDING    Antibody Screen PENDING    Sample Expiration      08/30/2020,2359 Performed at [facility], [place] [address], [place], [place] [address]   Glucose, capillary   Collection Time: 08/27/20  8:03 AM  Result Value Ref Range   Glucose-Capillary 111 (H) 70 - 99 mg/dL    Patient Active Problem List   Diagnosis Date Noted   Encounter for induction of labor 08/27/2020   Gestational diabetes mellitus (GDM), antepartum 06/26/2020   Supervision of other normal  pregnancy, antepartum 03/12/2020   Antepartum multigravida of advanced maternal age 05/10/2020   Renal cyst 12/22/2019   Bulging lumbar disc 12/22/2019   Preventative health care 08/09/2017   Vitamin D deficiency 04/01/2017   Prediabetes 04/01/2017   Other fatigue 03/17/2017   Pre-hypertension 03/17/2017   Low back pain 07/23/2014   Right wrist pain 09/05/2012   Toenail fungus 05/25/2012  Meralgia paraesthetica 05/25/2012    Assessment/Plan:  Jody Bryan is a 40 y.o. G2P1001 at 57w0dhere for IOL-A1GDM, LGA.  #IOL: Discussed IOL process with patient. Given cervical exam will dose cytotec and attempt FB placement at next check. #Pain: PRN #FWB: cat 1 #ID: GBS neg #MOF: breast #MOC: BTL, consent signed 07/17/20 #Circ: yes #A1GDM/LGA: EFW 95%ile _0 , 3486g. Q4 BGL in latent labor, q2 in active labor. #Elevated BP: no diagnosis, asymptomatic, does not meet criteria. PreE labs pending. Continue to monitor.  AArrie Senate MD  08/27/2020, 9:02 AM

## 2020-08-28 ENCOUNTER — Encounter (HOSPITAL_COMMUNITY): Payer: Self-pay | Admitting: Family Medicine

## 2020-08-28 DIAGNOSIS — O4202 Full-term premature rupture of membranes, onset of labor within 24 hours of rupture: Secondary | ICD-10-CM

## 2020-08-28 DIAGNOSIS — O3663X0 Maternal care for excessive fetal growth, third trimester, not applicable or unspecified: Secondary | ICD-10-CM

## 2020-08-28 DIAGNOSIS — Z3A38 38 weeks gestation of pregnancy: Secondary | ICD-10-CM

## 2020-08-28 DIAGNOSIS — O24420 Gestational diabetes mellitus in childbirth, diet controlled: Secondary | ICD-10-CM

## 2020-08-28 MED ORDER — ACETAMINOPHEN 325 MG PO TABS
650.0000 mg | ORAL_TABLET | Freq: Four times a day (QID) | ORAL | Status: DC
  Administered 2020-08-28 – 2020-08-29 (×6): 650 mg via ORAL
  Filled 2020-08-28 (×6): qty 2

## 2020-08-28 MED ORDER — LACTATED RINGERS IV SOLN: INTRAVENOUS | Status: DC

## 2020-08-28 MED ORDER — ONDANSETRON HCL 4 MG/2ML IJ SOLN: 4.0000 mg | INTRAMUSCULAR | Status: DC | PRN

## 2020-08-28 MED ORDER — METOCLOPRAMIDE HCL 10 MG PO TABS: 10.0000 mg | ORAL_TABLET | Freq: Once | ORAL | Status: DC

## 2020-08-28 MED ORDER — PRENATAL MULTIVITAMIN CH
1.0000 | ORAL_TABLET | Freq: Every day | ORAL | Status: DC
  Administered 2020-08-28 – 2020-08-29 (×2): 1 via ORAL
  Filled 2020-08-28 (×2): qty 1

## 2020-08-28 MED ORDER — ONDANSETRON HCL 4 MG PO TABS: 4.0000 mg | ORAL_TABLET | ORAL | Status: DC | PRN

## 2020-08-28 MED ORDER — COCONUT OIL OIL
1.0000 "application " | TOPICAL_OIL | Status: DC | PRN
  Administered 2020-08-29: 1 via TOPICAL

## 2020-08-28 MED ORDER — TRANEXAMIC ACID-NACL 1000-0.7 MG/100ML-% IV SOLN
1000.0000 mg | INTRAVENOUS | Status: AC
  Administered 2020-08-28: 1000 mg via INTRAVENOUS

## 2020-08-28 MED ORDER — SIMETHICONE 80 MG PO CHEW: 80.0000 mg | CHEWABLE_TABLET | ORAL | Status: DC | PRN

## 2020-08-28 MED ORDER — SENNOSIDES-DOCUSATE SODIUM 8.6-50 MG PO TABS
2.0000 | ORAL_TABLET | Freq: Every day | ORAL | Status: DC
  Administered 2020-08-28 – 2020-08-29 (×2): 2 via ORAL
  Filled 2020-08-28 (×2): qty 2

## 2020-08-28 MED ORDER — DIPHENHYDRAMINE HCL 50 MG/ML IJ SOLN
INTRAMUSCULAR | Status: AC
  Filled 2020-08-28: qty 1

## 2020-08-28 MED ORDER — FAMOTIDINE 20 MG PO TABS: 40.0000 mg | ORAL_TABLET | Freq: Once | ORAL | Status: DC

## 2020-08-28 MED ORDER — DIPHENHYDRAMINE HCL 50 MG/ML IJ SOLN
25.0000 mg | Freq: Once | INTRAMUSCULAR | Status: AC
  Administered 2020-08-28: 25 mg via INTRAVENOUS

## 2020-08-28 MED ORDER — TRANEXAMIC ACID-NACL 1000-0.7 MG/100ML-% IV SOLN
INTRAVENOUS | Status: AC
  Filled 2020-08-28: qty 100

## 2020-08-28 MED ORDER — WITCH HAZEL-GLYCERIN EX PADS: 1.0000 "application " | MEDICATED_PAD | CUTANEOUS | Status: DC | PRN

## 2020-08-28 MED ORDER — IBUPROFEN 600 MG PO TABS
600.0000 mg | ORAL_TABLET | Freq: Four times a day (QID) | ORAL | Status: DC
  Administered 2020-08-28 – 2020-08-29 (×5): 600 mg via ORAL
  Filled 2020-08-28 (×6): qty 1

## 2020-08-28 MED ORDER — TETANUS-DIPHTH-ACELL PERTUSSIS 5-2.5-18.5 LF-MCG/0.5 IM SUSY: 0.5000 mL | PREFILLED_SYRINGE | Freq: Once | INTRAMUSCULAR | Status: DC

## 2020-08-28 MED ORDER — DIPHENHYDRAMINE HCL 25 MG PO CAPS
25.0000 mg | ORAL_CAPSULE | Freq: Four times a day (QID) | ORAL | Status: DC | PRN
Start: 2020-08-28 — End: 2020-08-29

## 2020-08-28 MED ORDER — BENZOCAINE-MENTHOL 20-0.5 % EX AERO
1.0000 "application " | INHALATION_SPRAY | CUTANEOUS | Status: DC | PRN
  Administered 2020-08-28: 1 via TOPICAL
  Filled 2020-08-28: qty 56

## 2020-08-28 MED ORDER — DIBUCAINE (PERIANAL) 1 % EX OINT: 1.0000 "application " | TOPICAL_OINTMENT | CUTANEOUS | Status: DC | PRN

## 2020-08-28 NOTE — Lactation Note (Signed)
This note was copied from a baby's chart. Lactation Consultation Note  Patient Name: Jody Bryan WVPXT'[id number] Date: 08/28/2020 Reason for consult: Initial assessment;Early term 37-38.6wks Age:42 hours   P2 mother whose infant is now 73 hours old.  This is an ETI at 38+1 weeks.  Mother breast fed her first child (now 38 years old) for 5 months.  RN in room assisting with mother's care when I arrived.  Baby swaddled and asleep in the bassinet.  Reviewed breast feeding basics including hand expression.  Mother was able to express colostrum drops.  Since baby is not awakening or showing cues I suggested mother call for my assistance with the next feeding.  Mother verbalized understanding.  RN updated.   Maternal Data Has patient been taught Hand Expression?: Yes Does the patient have breastfeeding experience prior to this delivery?: Yes How long did the patient breastfeed?: 5 months  Feeding Mother's Current Feeding Choice: Breast Milk  LATCH Score Latch: Grasps breast easily, tongue down, lips flanged, rhythmical sucking.  Audible Swallowing: Spontaneous and intermittent  Type of Nipple: Everted at rest and after stimulation  Comfort (Breast/Nipple): Soft / non-tender  Hold (Positioning): Assistance needed to correctly position infant at breast and maintain latch.  LATCH Score: 9   Lactation Tools Discussed/Used    Interventions Interventions: Breast feeding basics reviewed;Assisted with latch;Skin to skin;Hand express;Breast compression;Adjust position;Support pillows;Position options;Expressed milk;Education  Discharge Pump: Personal WIC Program: Yes  Consult Status Consult Status: Follow-up Date: 08/28/20 Follow-up type: In-patient    Jody Bryan 08/28/2020, 4:55 AM

## 2020-08-28 NOTE — Lactation Note (Signed)
This note was copied from a baby's chart. Lactation Consultation Note  Patient Name: Boy Clydette Privitera WCBJS'E Date: 08/28/2020 Reason for consult: L&D Initial assessment;Early term 37-38.6wks Age:42 hours LC entered the room, infant was cuing to breastfeed. Mom latched infant on her left breast using the football hold position, infant sustained latch and BF with depth, infant was still BF after 20 minutes when LC left the room. Mom knows to BF infant according to primal cues: licking, kissing, tasting, smacking, hands and fist in mouth and BF infant STS.  Mom knows to call RN or LC on  MBU if she needs further assistance with latching infant at the breast.  LC discussed infant's input and output with parents.  Maternal Data Has patient been taught Hand Expression?: Yes Does the patient have breastfeeding experience prior to this delivery?: Yes How long did the patient breastfeed?: Per mom, she BF her 9 year oldson for 5 months  Feeding Mother's Current Feeding Choice: Breast Milk  LATCH Score Latch: Grasps breast easily, tongue down, lips flanged, rhythmical sucking.  Audible Swallowing: Spontaneous and intermittent  Type of Nipple: Everted at rest and after stimulation  Comfort (Breast/Nipple): Soft / non-tender  Hold (Positioning): Assistance needed to correctly position infant at breast and maintain latch.  LATCH Score: 9   Lactation Tools Discussed/Used    Interventions Interventions: Breast feeding basics reviewed;Assisted with latch;Skin to skin;Hand express;Breast compression;Adjust position;Support pillows;Position options;Expressed milk;Education  Discharge Pump: Personal WIC Program: Yes  Consult Status Consult Status: Follow-up Date: 08/28/20 Follow-up type: In-patient    Danelle Earthly 08/28/2020, 1:52 AM

## 2020-08-28 NOTE — Progress Notes (Signed)
Patient desires permanent sterilization.  Other reversible forms of contraception were discussed with patient; she declines all other modalities. Risks of procedure discussed with patient including but not limited to: risk of regret, permanence of method, bleeding, infection, injury to surrounding organs and need for additional procedures.  Failure risk of 1-2 % with increased risk of ectopic gestation if pregnancy occurs was also discussed with patient.  Patient verbalized understanding of these risks and wants to proceed with sterilization.  Written informed consent obtained.  To OR when ready.  

## 2020-08-28 NOTE — Lactation Note (Signed)
This note was copied from a baby's chart. Lactation Consultation Note  Patient Name: Boy Mackenzee Becvar [id number] Date: 08/28/2020 Reason for consult: Follow-up assessment;Early term 37-38.6wks Age:42 hours   P2 mother whose infant is now 50 hours old.  This is an ETI at 38+1 weeks.  Mother breast fed her first child (now 81 years old) for 5 months.  Mother requested latch assistance.  Baby awake but not showing cues when I arrived.  Mother interested in latching to the right breast in the football hold.  She demonstrated hand expression and was able to express one drop of colostrum  Latched baby to the breast after a couple of attempts.  Once at the breast, he required constant stimulation to initiate a suck.  Baby sucked on/off for 5 minutes. Reviewed body positioning and alignment.  Swaddled and baby fell back to sleep.  Mother appreciative of help given.  Father remains asleep on the couch.   Maternal Data Has patient been taught Hand Expression?: Yes Does the patient have breastfeeding experience prior to this delivery?: Yes How long did the patient breastfeed?: 5 months  Feeding Mother's Current Feeding Choice: Breast Milk  LATCH Score Latch: Repeated attempts needed to sustain latch, nipple held in mouth throughout feeding, stimulation needed to elicit sucking reflex.  Audible Swallowing: None  Type of Nipple: Everted at rest and after stimulation (short shafted)  Comfort (Breast/Nipple): Soft / non-tender  Hold (Positioning): Assistance needed to correctly position infant at breast and maintain latch.  LATCH Score: 6   Lactation Tools Discussed/Used    Interventions Interventions: Breast feeding basics reviewed;Assisted with latch;Skin to skin;Breast massage;Hand express;Breast compression;Adjust position;Position options;Support pillows;Education  Discharge Pump: Personal  Consult Status Consult Status: Follow-up Date: 08/29/20 Follow-up type:  In-patient    Salimata Christenson R Torianna Junio 08/28/2020, 6:25 AM

## 2020-08-28 NOTE — Discharge Summary (Addendum)
Postpartum Discharge Summary     Patient Name: Jody Bryan DOB: [birth year] MRN: [id number]  Date of admission: 08/27/2020 Delivery date:08/28/2020  Delivering provider: Randa Ngo  Date of discharge: 08/29/2020  Admitting diagnosis: Encounter for induction of labor [Z34.90] Intrauterine pregnancy: [redacted]w[redacted]d    Secondary diagnosis:  Principal Problem:   Vaginal delivery Active Problems:   Gestational hypertension   Prediabetes   Antepartum multigravida of advanced maternal age   Gestational diabetes mellitus (GDM), antepartum   Encounter for induction of labor  Additional problems: as noted above Discharge diagnosis: Term Pregnancy Delivered and Gestational Hypertension                                              Post partum procedures: none Augmentation: Cytotec and IP Foley Complications: None  Hospital course: Induction of Labor With Vaginal Delivery   40y.o. yo G2P1001 at 42w1das admitted to the hospital 08/27/2020 for induction of labor.  Indication for induction:  fetal macrosomia . She received cytotec and Cook's catheter was placed. She then had SROM for clear fluid and progressed to complete cervical dilation. Patient had a labor course that included some mild range BP elevations with no symptoms and nl labs, but being >4hrs apart giving the dx of gHTN. Membrane Rupture Time/Date: 11:00 PM ,08/27/2020   Delivery Method:Vaginal, Spontaneous  Episiotomy: None  Lacerations:  None  Details of delivery can be found in separate delivery note. Patient had a postpartum course remarkable for being started on Procardia 30XL prior to d/c due to mild range elevations. She desired a circumcision for her son prior to d/c; consented and note placed in infant's chart. Patient is discharged home 08/29/20.  Newborn Data: Birth date:08/28/2020  Birth time:12:56 AM  Gender:Female  Living status:Living  Apgars:9 ,9  Weight:3860 g (8lb 8.2oz)  Magnesium Sulfate received: No BMZ  received: No Rhophylac:N/A MMR:N/A T-DaP:Given prenatally Flu: Yes Transfusion:No  Physical exam  Vitals:   08/28/20 1346 08/28/20 1831 08/28/20 2120 08/29/20 0559  BP: 128/70 124/77 132/75 (!) 147/97  Pulse: 100 (!) 106 (!) 110 (!) 101  Resp: _0 Temp: 98.2 F (36.8 C) 98.4 F (36.9 C) 98.1 F (36.7 C) 98.3 F (36.8 C)  TempSrc:   Oral Oral  SpO2:   100% 100%  Weight:      Height:       General: alert and cooperative Lochia: appropriate Uterine Fundus: firm Incision: N/A DVT Evaluation: No evidence of DVT seen on physical exam. Labs: Lab Results  Component Value Date   WBC 12.2 (H) 08/27/2020   HGB 13.4 08/27/2020   HCT 38.4 08/27/2020   MCV 94.6 08/27/2020   PLT 325 08/27/2020   CMP Latest Ref Rng & Units 08/27/2020  Glucose 70 - 99 mg/dL 113(H)  BUN 6 - 20 mg/dL 7  Creatinine 0.44 - 1.00 mg/dL 0.57  Sodium 135 - 145 mmol/L 135  Potassium 3.5 - 5.1 mmol/L 3.8  Chloride 98 - 111 mmol/L 102  CO2 22 - 32 mmol/L 18(L)  Calcium 8.9 - 10.3 mg/dL 9.6  Total Protein 6.5 - 8.1 g/dL 5.9(L)  Total Bilirubin 0.3 - 1.2 mg/dL 0.7  Alkaline Phos 38 - 126 U/L 143(H)  AST 15 - 41 U/L 21  ALT 0 - 44 U/L 18   Edinburgh Score: Edinburgh Postnatal Depression Scale  Screening Tool 08/28/2020  I have been able to laugh and see the funny side of things. (No Data)     After visit meds:  Allergies as of 08/29/2020   No Known Allergies      Medication List     STOP taking these medications    Accu-Chek Nano SmartView w/Device Kit   Accu-Chek SmartView test strip Generic drug: glucose blood   Accu-Chek Softclix Lancets lancets   aspirin EC 81 MG tablet       TAKE these medications    ibuprofen 600 MG tablet Commonly known as: ADVIL Take 1 tablet (600 mg total) by mouth every 6 (six) hours as needed.   Melatonin 1 MG Caps Take by mouth.   NIFEdipine 30 MG 24 hr tablet Commonly known as: ADALAT CC Take 1 tablet (30 mg total) by mouth daily.    prenatal multivitamin Tabs tablet Take 1 tablet by mouth daily at 12 noon.         Discharge home in stable condition Infant Feeding: Breast Infant Disposition:home with mother Discharge instruction: per After Visit Summary and Postpartum booklet. Activity: Advance as tolerated. Pelvic rest for 6 weeks.  Diet: routine diet Future Appointments: Future Appointments  Date Time Provider [place]  09/03/2020 10:30 AM [facility] NURSE [facility] None  10/11/2020  8:15 AM Stinson, Tanna Savoy, DO [facility] None   Follow up Visit: Message sent to [facility] by [facility].  Please schedule this patient for a In person postpartum visit in 6 weeks with the following provider: Any provider. Additional Postpartum F/U:2 hour GTT and BP check 1 week  High risk pregnancy complicated by:  [id number], gHTN, Fetal VSD, Fetal macrosomia Delivery mode:  Vaginal, Spontaneous  Anticipated Birth Control:  IUD (outpt)   08/29/2020 Myrtis Ser, CNM 6:29 AM

## 2020-08-28 NOTE — Progress Notes (Signed)
Pt stated she does not want to do tubal now/called alicia and she stated she will come talk to her

## 2020-08-29 ENCOUNTER — Encounter: Payer: 59 | Admitting: Family Medicine

## 2020-08-29 LAB — BIRTH TISSUE RECOVERY COLLECTION (PLACENTA DONATION)

## 2020-08-29 LAB — GLUCOSE, CAPILLARY: Glucose-Capillary: 77 mg/dL (ref 70–99)

## 2020-08-29 MED ORDER — NIFEDIPINE ER OSMOTIC RELEASE 30 MG PO TB24
30.0000 mg | ORAL_TABLET | Freq: Every day | ORAL | Status: DC
  Administered 2020-08-29: 30 mg via ORAL
  Filled 2020-08-29: qty 1

## 2020-08-29 MED ORDER — NIFEDIPINE ER 30 MG PO TB24: 30.0000 mg | ORAL_TABLET | Freq: Every day | ORAL | 1 refills | Status: DC

## 2020-08-29 MED ORDER — IBUPROFEN 600 MG PO TABS: 600.0000 mg | ORAL_TABLET | Freq: Four times a day (QID) | ORAL | 0 refills | Status: DC | PRN

## 2020-08-29 NOTE — Lactation Note (Signed)
This note was copied from Jody baby's chart. Lactation Consultation Note  Patient Name: Jody Bryan [id number] Date: 08/29/2020 Reason for consult: Follow-up assessment;Early term 37-38.6wks Age:42 hours  LC in to room for follow up. Parents are expecting discharge. Infant is sleeping after circumcision in basinet.  Discussed normal newborn behavior and patterns in addition to clusterfeeding. Talked about milk coming into volume.   Plan: 1-Skin to skin 2-Aim for Jody deep, comfortable latch 3-Breastfeeding on demand or 8-12 times in 24h period. 4-Keep infant awake during breastfeeding session: massaging breast, infant's hand/shoulder/feet 5-Monitor voids and stools as signs good intake.  6-Encouraged maternal rest, hydration and food intake.  7-Contact LC as needed for feeds/support/concerns/questions   All questions answered at this time. Reviewed LC brochure and [employer] booklet.     Feeding Mother's Current Feeding Choice: Breast Milk  Interventions Interventions: Breast feeding basics reviewed;Skin to skin;Hand express;Expressed milk;Education  Discharge Discharge Education: Warning signs for feeding baby;Engorgement and breast care Pump: Personal WIC Program: Yes  Consult Status Consult Status: Complete Date: 08/29/20 Follow-up type: Call as needed    Jody Bryan Jody Bryan 08/29/2020, 2:37 PM

## 2020-08-29 NOTE — Social Work (Addendum)
CSW received consult for hx of Depression.  CSW met with MOB to offer support and complete assessment.    CSW met with MOB at bedside. CSW observed MOB holding and bonding with the infant. FOB present at bedside. CSW congratulated MOB and FOB. CSW introduced role and offered MOB privacy during the assessment. MOB preferred that FOB stay during the assessment. MOB presented calm, bright and engaged with CSW. CSW inquired how MOB has felt since giving birth. MOB reports feeling fine however express the birth was not the same as her older child in that she felt more of the contractions.   CSW inquired about MOB history of depression. MOB reports she does not have depression. MOB disclosed her depression was situational. She was diagnosed with depression in 2015, during her divorce. MOB reports she went to therapy for two and half months and it helped. MOB reports her mood has been normal with normal life stressors. CSW inquired about MOB coping skills. MOB reports she prays and listens to music. CSW praised MOB for her coping skills. CSW inquired if MOB experienced post-partum with her older child. MOB reports she did not have postpartum. CSW provided education regarding the baby blues period vs. perinatal mood disorders, discussed treatment and gave resources for mental health follow up if concerns arise.  CSW recommended MOB complete a self-evaluation during the postpartum time period using the New Mom Checklist from Postpartum Progress and encouraged MOB to contact a medical professional if symptoms are noted at any time. MOB receptive to the information provided. CSW assessed MOB for safety. MOB denies thoughts of harm to self and others. CSW inquired about MOB supports. MOB acknowledges her spouse as primary support because most of her relatives do not live-in town.    CSW provided review of Sudden Infant Death Syndrome (SIDS) precautions and informed MOB no co-sleeping with the infant. MOB reports the  infant will sleep in crib. CSW inquired if MOB has essential items for the infant. MOB reports she has essential items for the infant. MOB has chosen [facility] for infant's follow up care. CSW inquired if MOB has transportation to appointments. MOB reports she has transportation. CSW assessed MOB for additional needs. MOB reports no further need.     CSW identifies no further need for intervention and no barriers to discharge at this time.   Jody Bryan, MSW, LCSW [facility] [place] Work[facility]  [phone] 08/29/2020  11:01 AM

## 2020-08-30 ENCOUNTER — Telehealth: Payer: Self-pay | Admitting: *Deleted

## 2020-08-30 NOTE — Telephone Encounter (Signed)
Transition Care Management Follow-up Telephone Call Date of discharge and from where: 08/29/2020 - [facility] How have you been since you were released from the hospital? "Good" Any questions or concerns? No  Items Reviewed: Did the pt receive and understand the discharge instructions provided? Yes  Medications obtained and verified? Yes  Other? No  Any new allergies since your discharge? No  Dietary orders reviewed? No Do you have support at home? Yes    Functional Questionnaire: (I = Independent and D = Dependent) ADLs: I  Bathing/Dressing- I  Meal Prep- I  Eating- I  Maintaining continence- I  Transferring/Ambulation- I  Managing Meds- I  Follow up appointments reviewed:  PCP Hospital f/u appt confirmed? No   Specialist Hospital f/u appt confirmed? Yes  Scheduled to see OBGYN on 09/03/2020 @ 1030 and 10/11/2020 @ 0815. Are transportation arrangements needed? No  If their condition worsens, is the pt aware to call PCP or go to the Emergency Dept.? Yes Was the patient provided with contact information for the PCP's office or ED? Yes Was to pt encouraged to call back with questions or concerns? Yes

## 2020-08-31 DIAGNOSIS — R600 Localized edema: Secondary | ICD-10-CM | POA: Diagnosis not present

## 2020-08-31 DIAGNOSIS — O139 Gestational [pregnancy-induced] hypertension without significant proteinuria, unspecified trimester: Secondary | ICD-10-CM | POA: Diagnosis not present

## 2020-09-03 ENCOUNTER — Ambulatory Visit: Payer: 59 | Admitting: Advanced Practice Midwife

## 2020-09-03 ENCOUNTER — Other Ambulatory Visit: Payer: Self-pay

## 2020-09-03 VITALS — BP 155/77 | HR 106 | Wt 255.0 lb

## 2020-09-03 DIAGNOSIS — O139 Gestational [pregnancy-induced] hypertension without significant proteinuria, unspecified trimester: Secondary | ICD-10-CM

## 2020-09-03 MED ORDER — NIFEDIPINE ER OSMOTIC RELEASE 60 MG PO TB24: 60.0000 mg | ORAL_TABLET | Freq: Every day | ORAL | 0 refills | Status: DC

## 2020-09-03 NOTE — Progress Notes (Signed)
Postpartum BP check Vitals:   09/03/20 1048  BP: (!) 155/77  Pulse: (!) 106    Taking Procardia XL 30mg  daily Per Dr consult, will increase to 60mg  and recheck in 1 week

## 2020-09-04 ENCOUNTER — Telehealth: Payer: Self-pay | Admitting: Family Medicine

## 2020-09-04 ENCOUNTER — Other Ambulatory Visit: Payer: 59

## 2020-09-04 VITALS — BP 132/68 | HR 111 | Ht 65.0 in | Wt 256.0 lb

## 2020-09-04 DIAGNOSIS — R7989 Other specified abnormal findings of blood chemistry: Secondary | ICD-10-CM

## 2020-09-04 DIAGNOSIS — R600 Localized edema: Secondary | ICD-10-CM

## 2020-09-04 MED ORDER — HYDROCHLOROTHIAZIDE 25 MG PO TABS: 25.0000 mg | ORAL_TABLET | Freq: Every day | ORAL | 3 refills | Status: DC

## 2020-09-04 NOTE — Progress Notes (Addendum)
Subjective:  Jody Bryan is a 40 y.o. female here for BP check.   Hypertension ROS: taking medications as instructed, no medication side effects noted, no TIA's, no chest pain on exertion, no dyspnea on exertion, and noting swelling of ankles.    Objective:  LMP 12/05/2019   Appearance alert, well appearing, and in no distress. General exam BP noted to be well controlled today in office.    Assessment:   Blood Pressure improved.   Plan:  Orders and follow up as documented in patient record. Will add HCTZ 25mg  1 po qd. Pt to come back on 09/10/20 for repeat BP check.

## 2020-09-04 NOTE — Telephone Encounter (Signed)
Looked at labs from urgent care visit on 7/2 - had elevated LFTs. BP checked yesterday with increase of procardia. Called patient - is having some headaches, but also a lot of swelling. Will have patient stop by and check BP and get labs to check LFTs.

## 2020-09-04 NOTE — Telephone Encounter (Signed)
-----   Message from Mikey Bussing, [place] sent at 09/04/2020  8:41 AM EDT ----- Regarding: Labs This pt was seen in a [facility] ED on 08/31/20 and she would like for you to review her labs.

## 2020-09-05 ENCOUNTER — Encounter: Payer: 59 | Admitting: Family Medicine

## 2020-09-05 LAB — COMPREHENSIVE METABOLIC PANEL
ALT: 35 IU/L — ABNORMAL HIGH (ref 0–32)
AST: 23 IU/L (ref 0–40)
Albumin/Globulin Ratio: 1.3 (ref 1.2–2.2)
Albumin: 3.9 g/dL (ref 3.8–4.8)
Alkaline Phosphatase: 126 IU/L — ABNORMAL HIGH (ref 44–121)
BUN/Creatinine Ratio: 14 (ref 9–23)
BUN: 12 mg/dL (ref 6–24)
Bilirubin Total: 0.6 mg/dL (ref 0.0–1.2)
CO2: 20 mmol/L (ref 20–29)
Calcium: 10 mg/dL (ref 8.7–10.2)
Chloride: 102 mmol/L (ref 96–106)
Creatinine, Ser: 0.87 mg/dL (ref 0.57–1.00)
Globulin, Total: 2.9 g/dL (ref 1.5–4.5)
Glucose: 110 mg/dL — ABNORMAL HIGH (ref 65–99)
Potassium: 4.4 mmol/L (ref 3.5–5.2)
Sodium: 139 mmol/L (ref 134–144)
Total Protein: 6.8 g/dL (ref 6.0–8.5)
eGFR: 85 mL/min/{1.73_m2} (ref >59)

## 2020-09-07 ENCOUNTER — Telehealth (HOSPITAL_COMMUNITY): Payer: Self-pay

## 2020-09-07 NOTE — Telephone Encounter (Signed)
"  I'm doing just fine, everything is wonderful. I have an appointment on Tuesday with my OB-GYN for a BP and swelling check." Patient declines questions or concerns about her healing.  "The baby is doing well. He has an appointment on the 29th. Breastfeeding is going well, he is eating good. I went to my [facility] appointment. They helped me and gave me a hand pump. Talked about all the benefits of breastfeeding for baby. He is gaining weight well." Patient declines questions or concerns about baby.  EPDS score is 1.  Marcelino Duster [facility] 09/07/2020,1400

## 2020-09-10 ENCOUNTER — Other Ambulatory Visit: Payer: Self-pay

## 2020-09-10 ENCOUNTER — Ambulatory Visit (INDEPENDENT_AMBULATORY_CARE_PROVIDER_SITE_OTHER): Payer: 59

## 2020-09-10 ENCOUNTER — Ambulatory Visit: Payer: 59

## 2020-09-10 VITALS — BP 134/81 | HR 93 | Wt 244.0 lb

## 2020-09-10 DIAGNOSIS — Z0130 Encounter for examination of blood pressure without abnormal findings: Secondary | ICD-10-CM

## 2020-09-10 NOTE — Progress Notes (Addendum)
Subjective:  Jody Bryan is a 40 y.o. female here for BP check.   Hypertension ROS: taking medications as instructed, no medication side effects noted, no TIA's, no chest pain on exertion, no dyspnea on exertion, and no swelling of ankles.    Objective:  LMP 12/05/2019   Appearance alert, well appearing, and in no distress. General exam BP noted to be well controlled today in office.    Assessment:   Blood Pressure stable.   Plan:  Current treatment plan is effective, no change in therapy. Pt to f/u at [facility] office visit. Jody Bryan l Jody Bryan, CMA   Patient was assessed and managed by nursing staff during this encounter. I have reviewed the chart and agree with the documentation and plan. I have also made any necessary editorial changes. Vitals:   09/10/20 1005  BP: 134/81  Pulse: [name] [place] 09/10/2020 11:55 AM

## 2020-10-02 ENCOUNTER — Other Ambulatory Visit: Payer: Self-pay | Admitting: Advanced Practice Midwife

## 2020-10-11 ENCOUNTER — Ambulatory Visit: Payer: 59 | Admitting: Family Medicine

## 2020-10-29 ENCOUNTER — Ambulatory Visit: Payer: 59 | Admitting: Obstetrics and Gynecology

## 2020-11-21 DIAGNOSIS — R3589 Other polyuria: Secondary | ICD-10-CM | POA: Diagnosis not present

## 2020-11-21 DIAGNOSIS — I10 Essential (primary) hypertension: Secondary | ICD-10-CM | POA: Diagnosis not present

## 2020-11-21 DIAGNOSIS — Z6841 Body Mass Index (BMI) 40.0 and over, adult: Secondary | ICD-10-CM | POA: Diagnosis not present

## 2020-11-21 DIAGNOSIS — N2889 Other specified disorders of kidney and ureter: Secondary | ICD-10-CM | POA: Diagnosis not present

## 2020-11-27 ENCOUNTER — Other Ambulatory Visit: Payer: Self-pay

## 2020-11-27 ENCOUNTER — Telehealth (INDEPENDENT_AMBULATORY_CARE_PROVIDER_SITE_OTHER): Payer: 59 | Admitting: Family Medicine

## 2020-11-27 DIAGNOSIS — O135 Gestational [pregnancy-induced] hypertension without significant proteinuria, complicating the puerperium: Secondary | ICD-10-CM

## 2020-11-27 DIAGNOSIS — O139 Gestational [pregnancy-induced] hypertension without significant proteinuria, unspecified trimester: Secondary | ICD-10-CM

## 2020-11-27 DIAGNOSIS — O99893 Other specified diseases and conditions complicating puerperium: Secondary | ICD-10-CM | POA: Diagnosis not present

## 2020-11-27 DIAGNOSIS — O24439 Gestational diabetes mellitus in the puerperium, unspecified control: Secondary | ICD-10-CM | POA: Diagnosis not present

## 2020-11-27 DIAGNOSIS — O24419 Gestational diabetes mellitus in pregnancy, unspecified control: Secondary | ICD-10-CM

## 2020-11-27 DIAGNOSIS — M25532 Pain in left wrist: Secondary | ICD-10-CM

## 2020-11-27 DIAGNOSIS — M25531 Pain in right wrist: Secondary | ICD-10-CM

## 2020-11-27 NOTE — Progress Notes (Signed)
Post Partum Visit Note  Jody Bryan is a 40 y.o. G33P2002 female who presents for a postpartum visit. She is 9 weeks postpartum following a normal spontaneous vaginal delivery.  I have fully reviewed the prenatal and intrapartum course. The delivery was at 38 gestational weeks.  Anesthesia: epidural. Postpartum course has been normal. Baby is doing well. Baby is feeding by breast. Bleeding no bleeding. Bowel function is normal. Bladder function is normal. Patient is sexually active. Contraception method is IUD. Postpartum depression screening: negative.   The pregnancy intention screening data noted above was reviewed. Potential methods of contraception were discussed. The patient elected to proceed with No data recorded.   Edinburgh Postnatal Depression Scale - 11/27/20 0819       Edinburgh Postnatal Depression Scale:  In the Past 7 Days   I have been able to laugh and see the funny side of things. 0    I have looked forward with enjoyment to things. 0    I have blamed myself unnecessarily when things went wrong. 0    I have been anxious or worried for no good reason. 0    I have felt scared or panicky for no good reason. 0    Things have been getting on top of me. 0    I have been so unhappy that I have had difficulty sleeping. 0    I have felt sad or miserable. 0    I have been so unhappy that I have been crying. 0    The thought of harming myself has occurred to me. 0    Edinburgh Postnatal Depression Scale Total 0             Health Maintenance Due  Topic Date Due   URINE MICROALBUMIN  Never done   COVID-19 Vaccine (3 - Pfizer risk series) 08/02/2019   INFLUENZA VACCINE  09/30/2020    The following portions of the patient's history were reviewed and updated as appropriate: allergies, current medications, past family history, past medical history, past social history, past surgical history, and problem list.  Review of Systems Pertinent items are noted in  HPI.  Objective:  There were no vitals taken for this visit.   General:  alert, cooperative, and no distress       Assessment:   1. Pain in both wrists   2. Postpartum exam   3. Gestational hypertension, antepartum   4. Gestational diabetes mellitus (GDM), antepartum, gestational diabetes method of control unspecified    normal postpartum exam.   Plan:   Essential components of care per ACOG recommendations:  1.  Mood and well being: Patient with negative depression screening today. Reviewed local resources for support.  - Patient tobacco use? No.   - hx of drug use? No.    2. Infant care and feeding:  -Patient currently breastmilk feeding? Yes. Reviewed importance of draining breast regularly to support lactation.  -Social determinants of health (SDOH) reviewed in EPIC. No concerns  3. Sexuality, contraception and birth spacing - Patient does not want a pregnancy in the next year.   - Reviewed forms of contraception in tiered fashion. Patient desired IUD today.   - Discussed birth spacing of 18 months  4. Sleep and fatigue -Encouraged family/partner/community support of 4 hrs of uninterrupted sleep to help with mood and fatigue  5. Physical Recovery  - Discussed patients delivery and complications. She describes her labor as good. - Patient had a Vaginal, no problems at delivery.  Patient expressed understanding - Patient has urinary incontinence? No. - Patient is safe to resume physical and sexual activity  6.  Health Maintenance - HM due items addressed Yes - Last pap smear  Diagnosis  Date Value Ref Range Status  02/22/2020   Final   - Negative for intraepithelial lesion or malignancy (NILM)   Pap smear not done at today's visit.  -Breast Cancer screening indicated? No. Currently breastfeeding - discussed potential for false positive. Will wait until next year  7. Chronic Disease/Pregnancy Condition follow up: None  - PCP follow up - HTN Will get scheduled  for 2hr GTT and IUD insertion  Levie Heritage, DO [facility] [employer], [facility]

## 2020-12-05 ENCOUNTER — Ambulatory Visit (INDEPENDENT_AMBULATORY_CARE_PROVIDER_SITE_OTHER): Payer: 59 | Admitting: Family Medicine

## 2020-12-05 ENCOUNTER — Other Ambulatory Visit: Payer: Self-pay

## 2020-12-05 ENCOUNTER — Encounter: Payer: Self-pay | Admitting: Family Medicine

## 2020-12-05 VITALS — BP 134/78 | HR 78 | Wt 253.0 lb

## 2020-12-05 DIAGNOSIS — Z30430 Encounter for insertion of intrauterine contraceptive device: Secondary | ICD-10-CM | POA: Diagnosis not present

## 2020-12-05 LAB — POCT URINE PREGNANCY: Preg Test, Ur: NEGATIVE

## 2020-12-05 MED ORDER — LEVONORGESTREL 20.1 MCG/DAY IU IUD
1.0000 | INTRAUTERINE_SYSTEM | Freq: Once | INTRAUTERINE | Status: AC
  Administered 2020-12-05: 1 via INTRAUTERINE

## 2020-12-05 NOTE — Progress Notes (Signed)
IUD Procedure Note Patient identified, informed consent performed, signed copy in chart, time out was performed.  Urine pregnancy test negative.  Speculum placed in the vagina.  Cervix visualized.  Cleaned with Betadine x 2.  Paracervical block placed with Lidocaine 2% with epinephrine 10mL spread between the 12 o'clock, 4 o'clock, 8 o'clock positions. Cervix grasped anteriorly with a single tooth tenaculum.  Uterus sounded to 10 cm.  Liletta  IUD placed per manufacturer's recommendations.  Strings trimmed to 3 cm. Tenaculum was removed, good hemostasis noted.  Patient tolerated procedure well.   Patient given post procedure instructions and Liletta care card with expiration date.  Patient is asked to check IUD strings periodically and follow up in 4-6 weeks for IUD check. 

## 2020-12-10 ENCOUNTER — Other Ambulatory Visit: Payer: 59

## 2021-01-02 ENCOUNTER — Other Ambulatory Visit: Payer: Self-pay

## 2021-01-02 ENCOUNTER — Ambulatory Visit (INDEPENDENT_AMBULATORY_CARE_PROVIDER_SITE_OTHER): Payer: 59 | Admitting: Family Medicine

## 2021-01-02 ENCOUNTER — Encounter: Payer: Self-pay | Admitting: Family Medicine

## 2021-01-02 VITALS — BP 131/78 | HR 72 | Ht 66.0 in | Wt 253.0 lb

## 2021-01-02 DIAGNOSIS — Z8632 Personal history of gestational diabetes: Secondary | ICD-10-CM | POA: Diagnosis not present

## 2021-01-02 DIAGNOSIS — Z30431 Encounter for routine checking of intrauterine contraceptive device: Secondary | ICD-10-CM

## 2021-01-02 NOTE — Progress Notes (Signed)
Patient presents for string check and to do her 2 hr postpartum gtt. [name] RN

## 2021-01-02 NOTE — Progress Notes (Signed)
   Subjective:   Patient Name: Jody Bryan, female   DOB: 06-29-1980, 40 y.o.  MRN: [id number]  HPI Patient here for an IUD check.  She had the Liletta IUD placed 1 month ago.  She reports light bleeding.   Review of Systems  Constitutional: Negative for fever and chills.  Gastrointestinal: Negative for abdominal pain.  Genitourinary: Negative for vaginal discharge, vaginal pain, pelvic pain and dyspareunia.        Objective:   Physical Exam  Constitutional: She appears well-developed and well-nourished.  HENT:  Head: Normocephalic and atraumatic.  Abdominal: Soft. There is no tenderness. There is no guarding.  Genitourinary: There is no rash, tenderness or lesion on the right labia. There is no rash, tenderness or lesion on the left labia. No erythema or tenderness in the vagina. No foreign body around the vagina. No signs of injury around the vagina. No vaginal discharge found.    Skin: Skin is warm and dry.  Psychiatric: She has a normal mood and affect. Her behavior is normal. Judgment and thought content normal.       Assessment & Plan:  1. IUD check up IUD in place.  Pt to call with any other problems.  Recheck in 1 year.  2. History of gestational diabetes - Hemoglobin A1c

## 2021-01-03 LAB — HEMOGLOBIN A1C
Est. average glucose Bld gHb Est-mCnc: 111 mg/dL
Hgb A1c MFr Bld: 5.5 % (ref 4.8–5.6)

## 2021-01-15 DIAGNOSIS — M25531 Pain in right wrist: Secondary | ICD-10-CM

## 2021-01-15 NOTE — Telephone Encounter (Signed)
Still having right wrist pain after delivery. Referral made to sports medicine.

## 2021-01-17 ENCOUNTER — Ambulatory Visit (INDEPENDENT_AMBULATORY_CARE_PROVIDER_SITE_OTHER): Payer: 59 | Admitting: Family Medicine

## 2021-01-17 ENCOUNTER — Encounter: Payer: Self-pay | Admitting: Family Medicine

## 2021-01-17 VITALS — BP 120/82 | Ht 65.0 in | Wt 246.0 lb

## 2021-01-17 DIAGNOSIS — M654 Radial styloid tenosynovitis [de Quervain]: Secondary | ICD-10-CM | POA: Diagnosis not present

## 2021-01-17 NOTE — Progress Notes (Signed)
  Jody Bryan - 40 y.o. female MRN [id number]  Date of birth: 06-May-1980  SUBJECTIVE:  Including CC & ROS.  No chief complaint on file.   Jody Bryan is a 40 y.o. female that is presenting with bilateral wrist pain.  The left is worse than the right.  Has been ongoing for a few weeks.  No injury.  It is occurring over the first dorsal compartment.   Review of Systems See HPI   HISTORY: Past Medical, Surgical, Social, and Family History Reviewed & Updated per EMR.   Pertinent Historical Findings include:  Past Medical History:  Diagnosis Date   Bilateral leg edema    Chicken pox    Depression    Drug use    Dyspnea    GERD (gastroesophageal reflux disease)    Low back pain     Past Surgical History:  Procedure Laterality Date   WISDOM TOOTH EXTRACTION  08/2019    Family History  Problem Relation Age of Onset   Hypertension Mother    Obesity Mother    Hypertension Father    Depression Father    Sleep apnea Father    Drug abuse Father    Obesity Father    Arthritis Paternal Grandmother    Cancer Paternal Grandmother        breast   Alcohol abuse Sister    Stroke Sister    Heart disease Maternal Grandmother    Alcohol abuse Maternal Grandfather     Social History   Socioeconomic History   Marital status: Single    Spouse name: Not on file   Number of children: 1   Years of education: Not on file   Highest education level: Not on file  Occupational History   Occupation:  call center  Tobacco Use   Smoking status: Former    Years: 2.00    Types: Cigarettes   Smokeless tobacco: Never  Vaping Use   Vaping Use: Never used  Substance and Sexual Activity   Alcohol use: Yes    Alcohol/week: 0.0 standard drinks    Comment: social   Drug use: Not Currently   Sexual activity: Yes    Birth control/protection: I.U.D.  Other Topics Concern   Not on file  Social History Narrative   Not on file   Social Determinants of Health   Financial Resource  Strain: Not on file  Food Insecurity: Not on file  Transportation Needs: Not on file  Physical Activity: Not on file  Stress: Not on file  Social Connections: Not on file  Intimate Partner Violence: Not on file     PHYSICAL EXAM:  VS: BP 120/82 (BP Location: Left Arm, Patient Position: Sitting)   Ht 5\' 5"  (1.651 m)   Wt 246 lb (111.6 kg)   BMI 40.94 kg/m  Physical Exam Gen: NAD, alert, cooperative with exam, well-appearing     ASSESSMENT & PLAN:   De Quervain's tenosynovitis, bilateral Has changes related to the first dorsal compartment with picking up her bating repetitively. -Counseled on home exercise therapy and supportive care. -Counseled on Voltaren. -Provided thumb spica brace. -Could consider physical therapy.

## 2021-01-17 NOTE — Assessment & Plan Note (Signed)
Has changes related to the first dorsal compartment with picking up her bating repetitively. -Counseled on home exercise therapy and supportive care. -Counseled on Voltaren. -Provided thumb spica brace. -Could consider physical therapy.

## 2021-01-17 NOTE — Patient Instructions (Signed)
Good to see you Please try ice  Please try the brace  Please try the exercises  You could consider voltaren  Please send me a message in MyChart with any questions or updates.  Please see me back in 3-4 weeks.   --Dr. Jordan Likes

## 2021-02-04 ENCOUNTER — Ambulatory Visit: Payer: 59 | Admitting: Family Medicine

## 2021-06-11 IMAGING — US US MFM OB FOLLOW-UP
1 series · 14 of 28 positions shown · non-contrast
Comparison: none

[Series 1: us mfm ob follow-up · 14 of 30 slices shown]
[im 2/30]
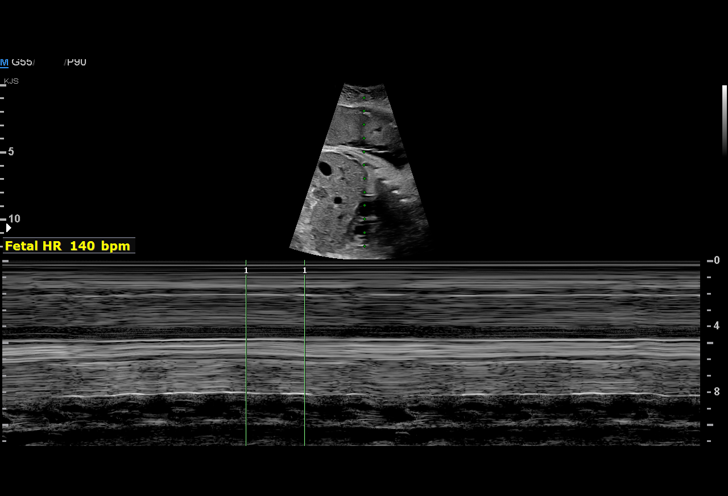
[im 4/30]
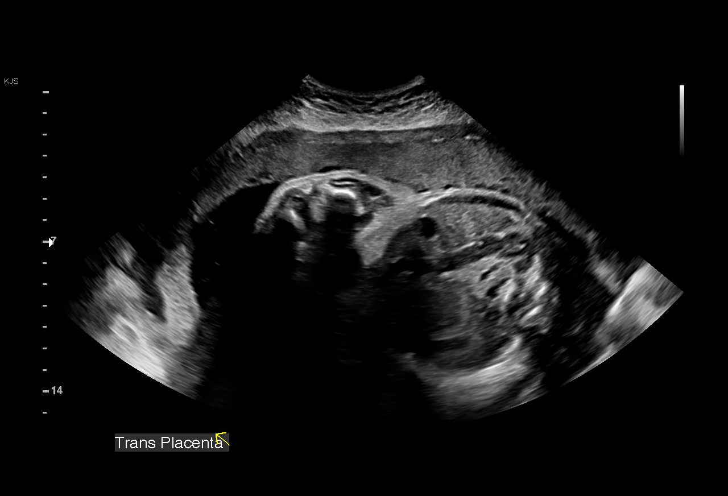
[im 6/30]
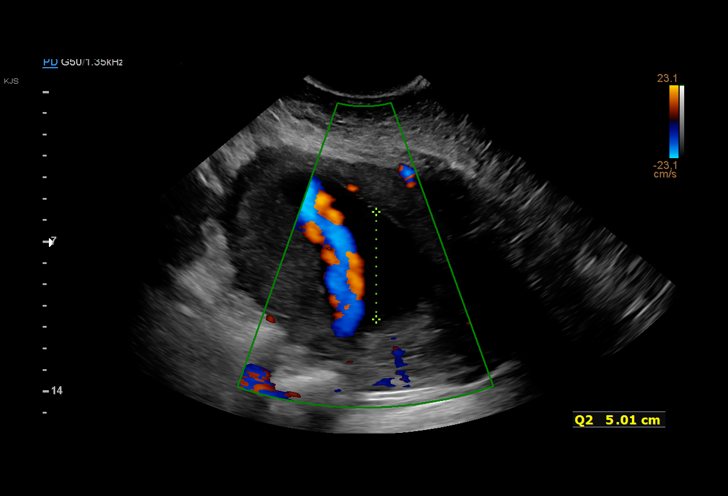
[im 8/30]
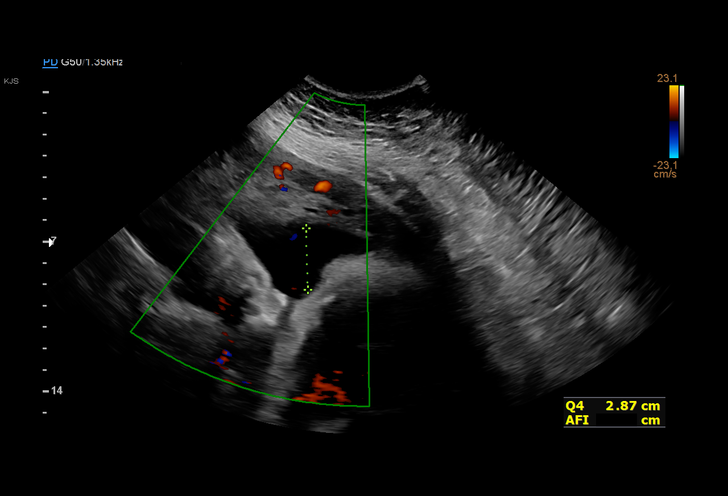
[im 10/30]
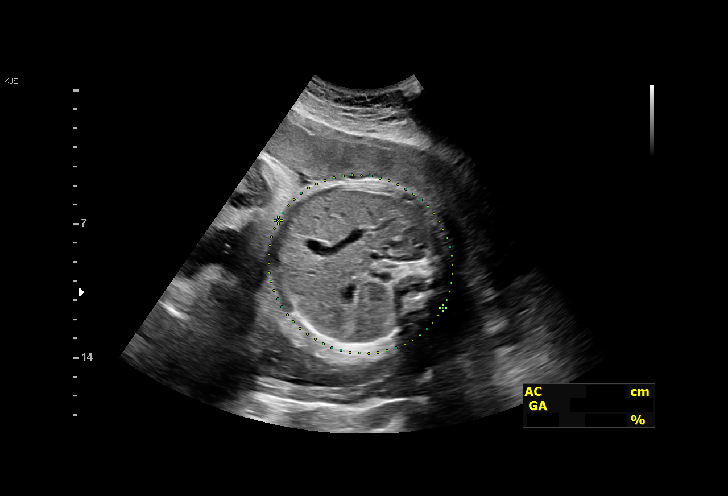
[im 12/30]
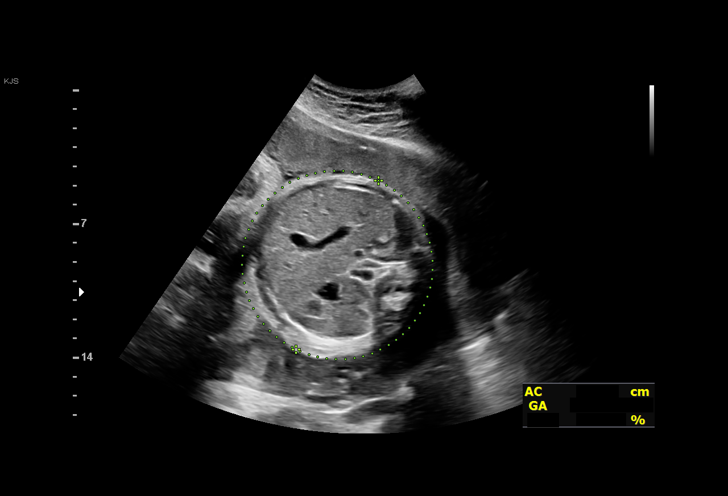
[im 14/30]
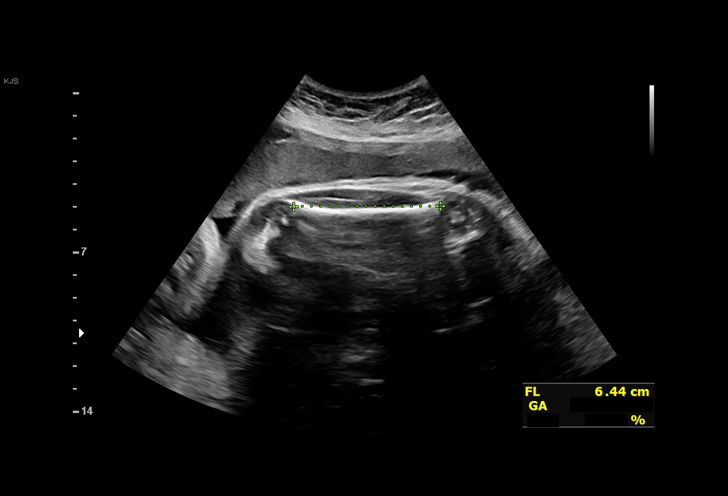
[im 17/30]
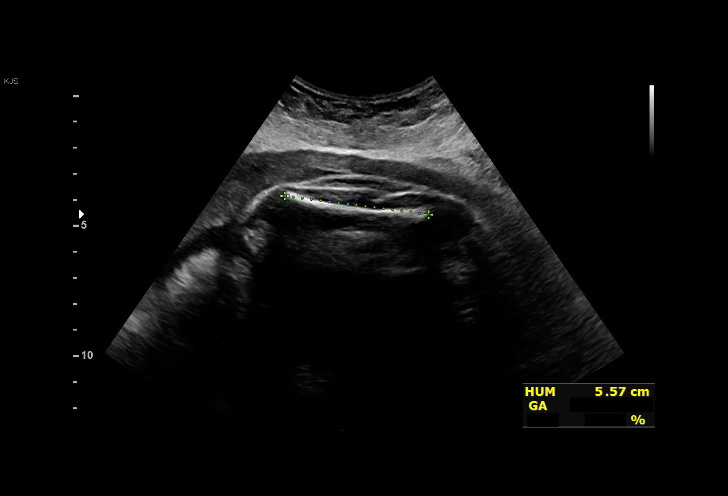
[im 19/30]
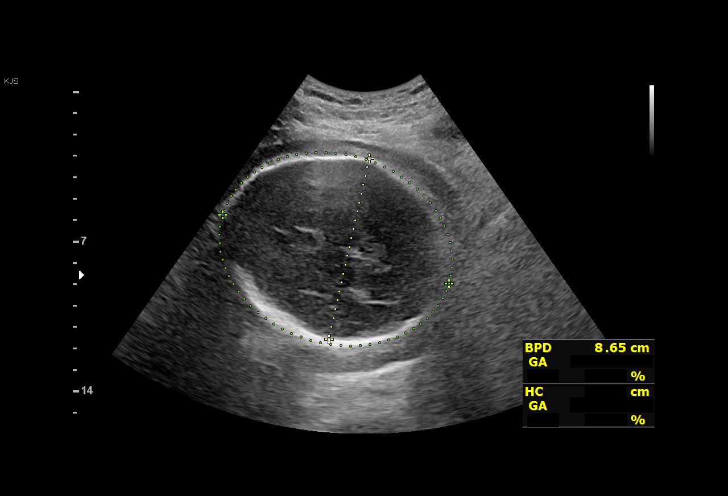
[im 21/30]
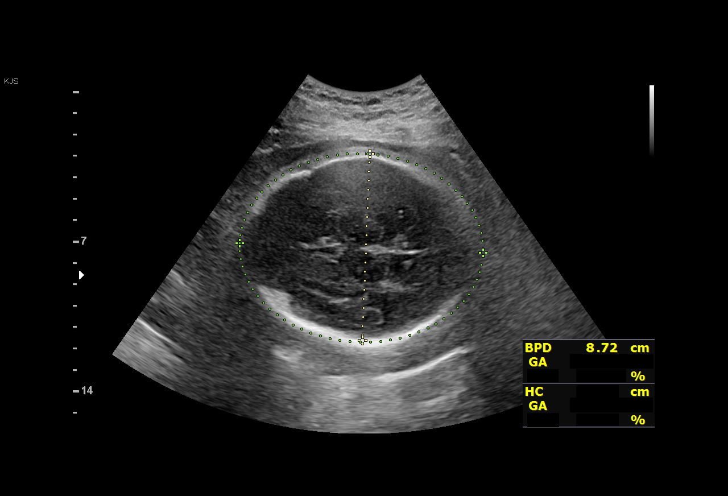
[im 23/30]
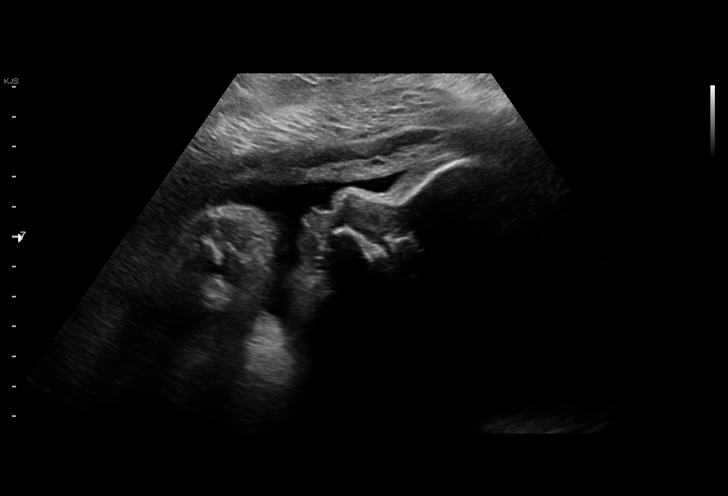
[im 25/30]
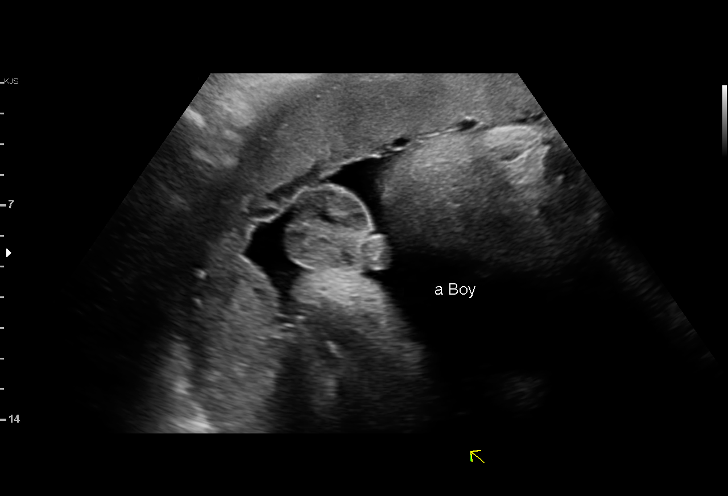
[im 27/30]
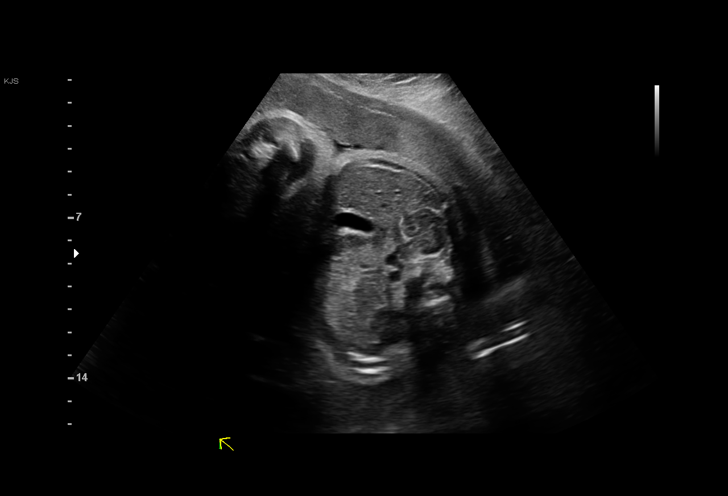
[im 30/30]
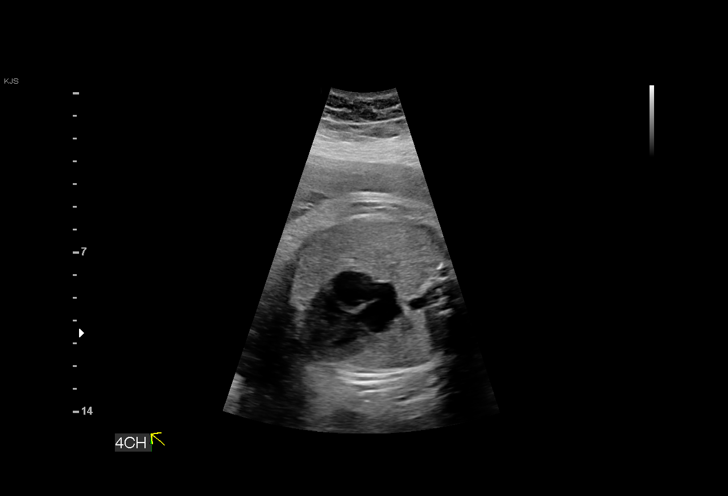

[14 of 28 positions shown; findings below may reference images not displayed]

Indications

 32 weeks gestation of pregnancy
 Advanced maternal age multigravida 35+,
 third trimester
 Obesity complicating pregnancy, third
 trimester
 Advanced maternal age multigravida 35+,
 third trimester
 Low Risk NIPS, Neg Horizon, AFP Neg
Fetal Evaluation

 Num Of Fetuses:         1
 Fetal Heart Rate(bpm):  140
 Cardiac Activity:       Observed
 Presentation:           Cephalic
 Placenta:               Anterior Fundal
 P. Cord Insertion:      Previously Visualized

 Amniotic Fluid
 AFI FV:      Within normal limits

 AFI Sum(cm)     %Tile       Largest Pocket(cm)
 20.4            77

 RUQ(cm)       RLQ(cm)       LUQ(cm)        LLQ(cm)
 7.7           2.9           5
Biometry
 BPD:      86.8  mm     G. Age:  35w 0d         97  %    CI:        75.83   %    70 - 86
                                                         FL/HC:      20.3   %    19.1 -
 HC:       316   mm     G. Age:  35w 3d         90  %    HC/AC:      1.04        0.96 -
 AC:      304.6  mm     G. Age:  34w 3d         95  %    FL/BPD:     74.0   %    71 - 87
 FL:       64.2  mm     G. Age:  33w 1d         62  %    FL/AC:      21.1   %    20 - 24
 HUM:      55.7  mm     G. Age:  32w 3d         55  %
 LV:        4.8  mm

 Est. FW:    2412  gm      5 lb 4 oz     93  %
OB History

 Gravidity:    2         Term:   1
 Living:       1
Gestational Age

 LMP:           32w 2d        Date:  12/05/19                 EDD:   09/10/20
 U/S Today:     34w 4d                                        EDD:   08/25/20
 Best:          32w 2d     Det. By:  LMP  (12/05/19)          EDD:   09/10/20
Anatomy

 Cranium:               Appears normal         Aortic Arch:            Previously seen
 Cavum:                 Previously seen        Ductal Arch:            Previously seen
 Ventricles:            Appears normal         Diaphragm:              Appears normal
 Choroid Plexus:        Previously seen        Stomach:                Appears normal, left
                                                                       sided
 Cerebellum:            Previously seen        Abdomen:                Previously seen
 Posterior Fossa:       Previously seen        Abdominal Wall:         Previously seen
 Nuchal Fold:           Not applicable (>20    Cord Vessels:           Previously seen
                        wks GA)
 Face:                  Absent nasal           Kidneys:                Appear normal
                        bone, prev seen
 Lips:                  Previously seen        Bladder:                Appears normal
 Thoracic:              Appears normal         Spine:                  Previously seen
 Heart:                 Appears normal         Upper Extremities:      Previously seen
                        (4CH, axis, and
                        situs)
 RVOT:                  Previously seen        Lower Extremities:      Previously seen
 LVOT:                  Previously seen

 Other:  Parents do not wish to know sex of fetus. Technically difficult due to
         maternal habitus and fetal position.
Cervix Uterus Adnexa

 Cervix
 Not visualized (advanced GA >28wks)

 Uterus
 No abnormality visualized.

 Cul De Sac
 No free fluid seen.
Comments

 Follow up growth due to elevated BMI and AMA > 40
 Normal interval growth with measurements consistent with
 dates
 Good fetal movement and amniotic fluid volume

 I reviewed the normal nature of today's examination. I
 recommend weekly testing initiated at 36 weeks.
 Consider delivery between 39-40 weeks.

## 2021-07-11 ENCOUNTER — Telehealth: Payer: Self-pay

## 2021-07-11 ENCOUNTER — Encounter: Payer: Self-pay | Admitting: Family Medicine

## 2021-07-11 ENCOUNTER — Telehealth (INDEPENDENT_AMBULATORY_CARE_PROVIDER_SITE_OTHER): Payer: 59 | Admitting: Family Medicine

## 2021-07-11 DIAGNOSIS — E66812 Obesity, class 2: Secondary | ICD-10-CM | POA: Insufficient documentation

## 2021-07-11 DIAGNOSIS — E66813 Obesity, class 3: Secondary | ICD-10-CM | POA: Insufficient documentation

## 2021-07-11 DIAGNOSIS — Z6841 Body Mass Index (BMI) 40.0 and over, adult: Secondary | ICD-10-CM | POA: Insufficient documentation

## 2021-07-11 MED ORDER — WEGOVY 0.25 MG/0.5ML ~~LOC~~ SOAJ: 0.2500 mg | SUBCUTANEOUS | 0 refills | Status: DC

## 2021-07-11 MED ORDER — WEGOVY 0.5 MG/0.5ML ~~LOC~~ SOAJ: 0.5000 mg | SUBCUTANEOUS | 1 refills | Status: DC

## 2021-07-11 NOTE — Progress Notes (Signed)
? ? ?MyChart Video Visit ? ? ? ?Virtual Visit via Video Note  ? ?This visit type was conducted due to national recommendations for restrictions regarding the COVID-19 Pandemic (e.g. social distancing) in an effort to limit this patient's exposure and mitigate transmission in our community. This patient is at least at moderate risk for complications without adequate follow up. This format is felt to be most appropriate for this patient at this time. Physical exam was limited by quality of the video and audio technology used for the visit. Jody Bryan. was able to get the patient set up on a video visit. ? ?Patient location: Home Patient and provider in visit ?Provider location: Office ? ?I discussed the limitations of evaluation and management by telemedicine and the availability of in person appointments. The patient expressed understanding and agreed to proceed. ? ?Visit Date: 07/11/2021 ? ?Today's healthcare provider: [name], DO  ? ? ?Subjective:  ? ? Patient ID: Jody Bryan, female    DOB: 1980-12-03, 41 y.o.   MRN: [id number] ? ?No chief complaint on file. ? ? ?HPI ?Patient is in today for a video visit. ? ?She is doing well at this time.  She recently had a baby. She is concerned about weight loss since her pregnancy last year. She is going to the gym about 3 times a week and is exercising. She is interested in weight loss medications.  She is not breastfeeding at this time.  ? ?She got an IUD in October 2022 and reports she has developed pain around her hips that is brought on by bending. She will talk to her OBGYN. ? ?She reports back pain that is localized to her shoulder blades.--- maybe from carrying a baby ? ?Past Medical History:  ?Diagnosis Date  ? Bilateral leg edema   ? Chicken pox   ? Depression   ? Drug use   ? Dyspnea   ? GERD (gastroesophageal reflux disease)   ? Low back pain   ? ? ?Past Surgical History:  ?Procedure Laterality Date  ? WISDOM TOOTH EXTRACTION  08/2019  ? ? ?Family  History  ?Problem Relation Age of Onset  ? Hypertension Mother   ? Obesity Mother   ? Hypertension Father   ? Depression Father   ? Sleep apnea Father   ? Drug abuse Father   ? Obesity Father   ? Arthritis Paternal Grandmother   ? Cancer Paternal Grandmother   ?     breast  ? Alcohol abuse Sister   ? Stroke Sister   ? Heart disease Maternal Grandmother   ? Alcohol abuse Maternal Grandfather   ? ? ?Social History  ? ?Socioeconomic History  ? Marital status: Single  ?  Spouse name: Not on file  ? Number of children: 1  ? Years of education: Not on file  ? Highest education level: Not on file  ?Occupational History  ? Occupation:  call center  ?Tobacco Use  ? Smoking status: Former  ?  Years: 2.00  ?  Types: Cigarettes  ? Smokeless tobacco: Never  ?Vaping Use  ? Vaping Use: Never used  ?Substance and Sexual Activity  ? Alcohol use: Yes  ?  Alcohol/week: 0.0 standard drinks  ?  Comment: social  ? Drug use: Not Currently  ? Sexual activity: Yes  ?  Birth control/protection: I.U.D.  ?Other Topics Concern  ? Not on file  ?Social History Narrative  ? Not on file  ? ?Social Determinants of Health  ? ?  Financial Resource Strain: Not on file  ?Food Insecurity: Not on file  ?Transportation Needs: Not on file  ?Physical Activity: Not on file  ?Stress: Not on file  ?Social Connections: Not on file  ?Intimate Partner Violence: Not on file  ? ? ?Outpatient Medications Prior to Visit  ?Medication Sig Dispense Refill  ? Prenatal Vit-Fe Fumarate-FA (PRENATAL MULTIVITAMIN) TABS tablet Take 1 tablet by mouth daily at 12 noon.    ? hydrochlorothiazide (HYDRODIURIL) 25 MG tablet Take 1 tablet (25 mg total) by mouth daily. (Patient not taking: Reported on 12/05/2020) 30 tablet 3  ? NIFEdipine (PROCARDIA XL) 60 MG 24 hr tablet Take 1 tablet (60 mg total) by mouth daily. (Patient not taking: Reported on 01/02/2021) 30 tablet 0  ? ?No facility-administered medications prior to visit.  ? ? ?No Known Allergies ? ?Review of Systems   ?Constitutional:  Negative for fever.  ?Respiratory:  Negative for cough.   ?Cardiovascular:  Negative for chest pain.  ?Genitourinary:  Negative for dysuria.  ?Musculoskeletal:  Positive for back pain and joint pain (hip).  ? ?   ?Objective:  ?  ?Physical Exam ?Constitutional:   ?   Appearance: Normal appearance. She is not ill-appearing.  ?Neurological:  ?   Mental Status: She is alert and oriented to person, place, and time.  ?Psychiatric:     ?   Mood and Affect: Mood normal.     ?   Behavior: Behavior normal.     ?   Judgment: Judgment normal.  ? ? ?Ht '5\' 5"'  (1.651 m)   Wt 255 lb (115.7 kg)   BMI 42.43 kg/m?  ?Wt Readings from Last 3 Encounters:  ?07/11/21 255 lb (115.7 kg)  ?01/17/21 246 lb (111.6 kg)  ?01/02/21 253 lb (114.8 kg)  ? ? ?Diabetic Foot Exam - Simple   ?No data filed ?  ? ?Lab Results  ?Component Value Date  ? WBC 12.2 (H) 08/27/2020  ? HGB 13.4 08/27/2020  ? HCT 38.4 08/27/2020  ? PLT 325 08/27/2020  ? GLUCOSE 110 (H) 09/04/2020  ? CHOL 175 10/10/2019  ? TRIG 51.0 10/10/2019  ? HDL 71.00 10/10/2019  ? [place] 94 10/10/2019  ? ALT 35 (H) 09/04/2020  ? AST 23 09/04/2020  ? NA 139 09/04/2020  ? K 4.4 09/04/2020  ? CL 102 09/04/2020  ? CREATININE 0.87 09/04/2020  ? BUN 12 09/04/2020  ? CO2 20 09/04/2020  ? TSH 1.44 10/10/2019  ? HGBA1C 5.5 01/02/2021  ? ? ?Lab Results  ?Component Value Date  ? TSH 1.44 10/10/2019  ? ?Lab Results  ?Component Value Date  ? WBC 12.2 (H) 08/27/2020  ? HGB 13.4 08/27/2020  ? HCT 38.4 08/27/2020  ? MCV 94.6 08/27/2020  ? PLT 325 08/27/2020  ? ?Lab Results  ?Component Value Date  ? NA 139 09/04/2020  ? K 4.4 09/04/2020  ? CO2 20 09/04/2020  ? GLUCOSE 110 (H) 09/04/2020  ? BUN 12 09/04/2020  ? CREATININE 0.87 09/04/2020  ? BILITOT 0.6 09/04/2020  ? ALKPHOS 126 (H) 09/04/2020  ? AST 23 09/04/2020  ? ALT 35 (H) 09/04/2020  ? PROT 6.8 09/04/2020  ? ALBUMIN 3.9 09/04/2020  ? CALCIUM 10.0 09/04/2020  ? ANIONGAP 15 08/27/2020  ? EGFR 85 09/04/2020  ? GFR 96.76 10/10/2019   ? ?Lab Results  ?Component Value Date  ? CHOL 175 10/10/2019  ? ?Lab Results  ?Component Value Date  ? HDL 71.00 10/10/2019  ? ?Lab Results  ?Component Value Date  ?  [place] 94 10/10/2019  ? ?Lab Results  ?Component Value Date  ? TRIG 51.0 10/10/2019  ? ?Lab Results  ?Component Value Date  ? CHOLHDL 2 10/10/2019  ? ?Lab Results  ?Component Value Date  ? HGBA1C 5.5 01/02/2021  ? ? ?   ?Assessment & Plan:  ? ?Problem List Items Addressed This Visit   ? ?  ? Unprioritized  ? Morbid obesity ([place]) - Primary  ?  con't with exercise and diet ?wegovy started ?F/u 1 month or sooner prn  ? ? ?  ?  ? Relevant Medications  ? Semaglutide-Weight Management (WEGOVY) 0.25 MG/0.5ML SOAJ  ? Semaglutide-Weight Management (WEGOVY) 0.5 MG/0.5ML SOAJ  ? ? ?Meds ordered this encounter  ?Medications  ? Semaglutide-Weight Management (WEGOVY) 0.25 MG/0.5ML SOAJ  ?  Sig: Inject 0.25 mg into the skin once a week.  ?  Dispense:  2 mL  ?  Refill:  0  ? Semaglutide-Weight Management (WEGOVY) 0.5 MG/0.5ML SOAJ  ?  Sig: Inject 0.5 mg into the skin once a week.  ?  Dispense:  2 mL  ?  Refill:  1  ? ? ?I discussed the assessment and treatment plan with the patient. The patient was provided an opportunity to ask questions and all were answered. The patient agreed with the plan and demonstrated an understanding of the instructions. ?  ?The patient was advised to call back or seek an in-person evaluation if the symptoms worsen or if the condition fails to improve as anticipated. ? ?I provided 20 minutes of face-to-face time during this encounter. ? ? ?I,Zite Okoli,acting as a Education administrator for [employer], DO.,have documented all relevant documentation on the behalf of [name], DO,as directed by  [name], DO while in the presence of [name], DO.  ? ?I, [name], DO, have reviewed all documentation for this visit. The documentation on 07/11/21 for the exam, diagnosis, procedures, and orders are all  accurate and complete.  ? ?[name], DO ?Archivist at [employer] ?[phone] (phone) ?[phone] (fax) ? ?[facility]  ?

## 2021-07-11 NOTE — Patient Instructions (Signed)

## 2021-07-11 NOTE — Telephone Encounter (Signed)
PA initiated via Covermymeds; KEY: [place]. Awaiting determination.  ?

## 2021-07-11 NOTE — Assessment & Plan Note (Signed)
con't with exercise and diet ?wegovy started ?F/u 1 month or sooner prn  ? ?

## 2021-07-14 NOTE — Telephone Encounter (Signed)
PA approved. Effective 07/14/2021 to 02/13/2022 ?

## 2021-07-15 ENCOUNTER — Ambulatory Visit: Payer: 59

## 2021-07-15 NOTE — Progress Notes (Signed)
Pt is here for injection education. Pt will be taking Wegovy. Pt supplied his/her own pen for initial dose. Demonstrated and walked through proper injection technique with pt.     Pt verbalized understanding and explained back to me injection instructions. Pt administered his first injection in the abdomen , tolerated well. Pt now feels comfortable self administering Wegovy.

## 2021-07-24 ENCOUNTER — Ambulatory Visit: Payer: 59 | Admitting: Family Medicine

## 2021-07-25 ENCOUNTER — Other Ambulatory Visit (HOSPITAL_COMMUNITY)
Admission: RE | Admit: 2021-07-25 | Discharge: 2021-07-25 | Disposition: A | Discharge status: Home / Self Care | Payer: 59 | Source: Ambulatory Visit | Attending: Family Medicine | Admitting: Family Medicine

## 2021-07-25 ENCOUNTER — Ambulatory Visit (INDEPENDENT_AMBULATORY_CARE_PROVIDER_SITE_OTHER): Payer: 59 | Admitting: Obstetrics & Gynecology

## 2021-07-25 ENCOUNTER — Encounter: Payer: Self-pay | Admitting: Obstetrics & Gynecology

## 2021-07-25 VITALS — BP 135/99 | HR 85 | Wt 252.0 lb

## 2021-07-25 DIAGNOSIS — Z30431 Encounter for routine checking of intrauterine contraceptive device: Secondary | ICD-10-CM | POA: Diagnosis not present

## 2021-07-25 DIAGNOSIS — Z124 Encounter for screening for malignant neoplasm of cervix: Secondary | ICD-10-CM | POA: Insufficient documentation

## 2021-07-25 DIAGNOSIS — Z975 Presence of (intrauterine) contraceptive device: Secondary | ICD-10-CM

## 2021-07-25 DIAGNOSIS — R102 Pelvic and perineal pain: Secondary | ICD-10-CM | POA: Diagnosis not present

## 2021-07-25 NOTE — Progress Notes (Signed)
   GYNECOLOGY OFFICE VISIT NOTE  History:   Jody Bryan is a 41 y.o. [id number] here today for evaluation of pain in hips, lower back and pelvis for a few weeks.  She was evaluated by her PCP who told her to evaluate her IUD's position as a source of this pain.  She denies any abnormal vaginal discharge, bleeding, pain during intercourse or other concerns.    Past Medical History:  Diagnosis Date   Bilateral leg edema    Chicken pox    Depression    Drug use    Dyspnea    GERD (gastroesophageal reflux disease)    Low back pain     Past Surgical History:  Procedure Laterality Date   WISDOM TOOTH EXTRACTION  08/2019    The following portions of the patient's history were reviewed and updated as appropriate: allergies, current medications, past family history, past medical history, past social history, past surgical history and problem list.   Health Maintenance:  Normal pap and negative HRHPV on 02/22/2020.  Normal mammogram on 10/16/2019.   Review of Systems:  Pertinent items noted in HPI and remainder of comprehensive ROS otherwise negative.  Physical Exam:  BP (!) 135/99   Pulse 85   Wt 252 lb (114.3 kg)   LMP 07/15/2021 (Approximate) Comment: has IUD  BMI 41.93 kg/m  CONSTITUTIONAL: Well-developed, well-nourished female in no acute distress.  HEENT:  Normocephalic, atraumatic. External right and left ear normal. No scleral icterus.  NECK: Normal range of motion, supple, no masses noted on observation SKIN: No rash noted. Not diaphoretic. No erythema. No pallor. MUSCULOSKELETAL: Normal range of motion. No edema noted. NEUROLOGIC: Alert and oriented to person, place, and time. Normal muscle tone coordination. No cranial nerve deficit noted. PSYCHIATRIC: Normal mood and affect. Normal behavior. Normal judgment and thought content. CARDIOVASCULAR: Normal heart rate noted RESPIRATORY: Effort and breath sounds normal, no problems with respiration noted ABDOMEN: No masses  noted. No other overt distention noted.   PELVIC: Normal appearing external genitalia; normal urethral meatus; normal appearing vaginal mucosa and cervix.  IUD strings seen about 3 cm in length. Bloody discharge noted (she is at end of her period).  Pap smear obtained.  Normal uterine size, no other palpable masses, no uterine or adnexal tenderness. Performed in the presence of a chaperone     Assessment and Plan:     1. Pelvic pain in female 2. IUD (intrauterine device) in place Pain could be musculoskeletal, but will obtain ultrasound to ensure correct intrauterine position of the IUD. This will also evaluate the whole reproductive system and look for other possible etiologies. In the meantime, NSAIDs recommended as needed for pain. - US PELVIC COMPLETE WITH TRANSVAGINAL; Future  3. Pap smear for cervical cancer screening - Cytology - PAP done, will follow up results and manage accordingly. Routine preventative health maintenance measures emphasized, will get mammogram later in the year. Please refer to After Visit Summary for other counseling recommendations.   Return for any gynecologic concerns.    I spent 25 minutes dedicated to the care of this patient including pre-visit review of records, face to face time with the patient discussing her conditions and treatments and post visit orders.    Jaynie Collins, MD, FACOG Obstetrician & Gynecologist, [facility] for [employer], [facility]

## 2021-07-30 ENCOUNTER — Ambulatory Visit (HOSPITAL_BASED_OUTPATIENT_CLINIC_OR_DEPARTMENT_OTHER): Admission: RE | Admit: 2021-07-30 | Payer: 59 | Source: Ambulatory Visit

## 2021-07-31 ENCOUNTER — Ambulatory Visit (HOSPITAL_BASED_OUTPATIENT_CLINIC_OR_DEPARTMENT_OTHER)
Admission: RE | Admit: 2021-07-31 | Discharge: 2021-07-31 | Disposition: A | Discharge status: Home / Self Care | Payer: 59 | Source: Ambulatory Visit | Attending: Obstetrics & Gynecology | Admitting: Obstetrics & Gynecology

## 2021-07-31 DIAGNOSIS — R102 Pelvic and perineal pain: Secondary | ICD-10-CM | POA: Diagnosis not present

## 2021-07-31 DIAGNOSIS — Z975 Presence of (intrauterine) contraceptive device: Secondary | ICD-10-CM | POA: Diagnosis present

## 2021-07-31 LAB — CYTOLOGY - PAP
Comment: NEGATIVE
Diagnosis: NEGATIVE
High risk HPV: NEGATIVE

## 2021-08-04 ENCOUNTER — Other Ambulatory Visit: Payer: Self-pay | Admitting: Obstetrics and Gynecology

## 2021-08-04 DIAGNOSIS — N83202 Unspecified ovarian cyst, left side: Secondary | ICD-10-CM

## 2021-08-07 ENCOUNTER — Telehealth: Payer: Self-pay | Admitting: Family Medicine

## 2021-08-07 NOTE — Telephone Encounter (Signed)
Error

## 2021-08-12 ENCOUNTER — Encounter: Payer: Self-pay | Admitting: Family Medicine

## 2021-08-12 ENCOUNTER — Ambulatory Visit (INDEPENDENT_AMBULATORY_CARE_PROVIDER_SITE_OTHER): Payer: 59 | Admitting: Family Medicine

## 2021-08-12 VITALS — BP 128/88 | HR 77 | Temp 98.2°F | Resp 18 | Ht 65.0 in | Wt 252.0 lb

## 2021-08-12 DIAGNOSIS — R1030 Lower abdominal pain, unspecified: Secondary | ICD-10-CM | POA: Diagnosis not present

## 2021-08-12 DIAGNOSIS — Z0000 Encounter for general adult medical examination without abnormal findings: Secondary | ICD-10-CM | POA: Diagnosis not present

## 2021-08-12 NOTE — Progress Notes (Signed)
Subjective:   By signing my name below, I, Jody Bryan, attest that this documentation has been prepared under the direction and in the presence of [name], DO  08/12/2021    Patient ID: Jody Bryan, female    DOB: 24-Aug-1980, 41 y.o.   MRN: [id number]  Chief Complaint  Patient presents with   Annual Exam    Pt states fasting     HPI Patient is in today for a comprehensive physical exam.   She reports her GYN found a fibroid on her uterus during her last visit. She continues having hip pain but her GYN told her the fibroid has nothing to do with her hip pain. Her hip pain presents while sitting. She notes prior to her recent pregnancy her pain was concentrated in her low back and moved to her hips after giving birth. She continues having the IUD in place. She reports during her last pap smear she was going through her menstrual cycle even with her IUD placed. She did not notice anything indicated her menstrual cycle during that time.   She denies having any fever, new moles, congestion, sinus pain, sore throat, chest pain, palpitations, cough, shortness of breath, wheezing, nausea, vomiting, diarrhea, constipation, dysuria, frequency, abdominal pain, hematuria, new muscle pain, headaches. She participates in regular exercise by walking at the gym for at least 30-40 minutes 5 days a week.  She is UTD on dental care.  She is UTD on vision care.    Past Medical History:  Diagnosis Date   Bilateral leg edema    Chicken pox    Depression    Drug use    Dyspnea    GERD (gastroesophageal reflux disease)    Low back pain     Past Surgical History:  Procedure Laterality Date   WISDOM TOOTH EXTRACTION  08/2019    Family History  Problem Relation Age of Onset   Hypertension Mother    Obesity Mother    Hypertension Father    Depression Father    Sleep apnea Father    Drug abuse Father    Obesity Father    Arthritis Paternal Grandmother    Cancer Paternal  Grandmother        breast   Alcohol abuse Sister    Stroke Sister    Heart disease Maternal Grandmother    Alcohol abuse Maternal Grandfather     Social History   Socioeconomic History   Marital status: Single    Spouse name: Not on file   Number of children: 1   Years of education: Not on file   Highest education level: Not on file  Occupational History   Occupation:  call center  Tobacco Use   Smoking status: Former    Years: 2.00    Types: Cigarettes   Smokeless tobacco: Never  Vaping Use   Vaping Use: Never used  Substance and Sexual Activity   Alcohol use: Yes    Alcohol/week: 0.0 standard drinks of alcohol    Comment: social   Drug use: Not Currently   Sexual activity: Yes    Birth control/protection: I.U.D.  Other Topics Concern   Not on file  Social History Narrative   Exercise ---  5 days a week for 40 min   Social Determinants of Health   Financial Resource Strain: Not on file  Food Insecurity: Not on file  Transportation Needs: Not on file  Physical Activity: Not on file  Stress: Not on file  Social Connections: Not on file  Intimate Partner Violence: Not on file    Outpatient Medications Prior to Visit  Medication Sig Dispense Refill   hydrochlorothiazide (HYDRODIURIL) 25 MG tablet Take 1 tablet (25 mg total) by mouth daily. 30 tablet 3   NIFEdipine (PROCARDIA XL) 60 MG 24 hr tablet Take 1 tablet (60 mg total) by mouth daily. 30 tablet 0   Prenatal Vit-Fe Fumarate-FA (PRENATAL MULTIVITAMIN) TABS tablet Take 1 tablet by mouth daily at 12 noon.     Semaglutide-Weight Management (WEGOVY) 0.25 MG/0.5ML SOAJ Inject 0.25 mg into the skin once a week. 2 mL 0   Semaglutide-Weight Management (WEGOVY) 0.5 MG/0.5ML SOAJ Inject 0.5 mg into the skin once a week. 2 mL 1   No facility-administered medications prior to visit.    No Known Allergies  Review of Systems  Constitutional:  Negative for fever.  HENT:  Negative for congestion, sinus pain and sore  throat.   Respiratory:  Negative for cough, shortness of breath and wheezing.   Cardiovascular:  Negative for chest pain and palpitations.  Gastrointestinal:  Negative for abdominal pain, constipation, diarrhea, nausea and vomiting.  Genitourinary:  Negative for dysuria, frequency and hematuria.  Musculoskeletal:  Positive for back pain.       (-)new muscle pain (-)Hip pain  Skin:        (-)New moles  Neurological:  Negative for headaches.       Objective:    Physical Exam Constitutional:      General: She is not in acute distress.    Appearance: Normal appearance. She is not ill-appearing.  HENT:     Head: Normocephalic and atraumatic.     Right Ear: Tympanic membrane, ear canal and external ear normal.     Left Ear: Tympanic membrane, ear canal and external ear normal.  Eyes:     Extraocular Movements: Extraocular movements intact.     Pupils: Pupils are equal, round, and reactive to light.  Cardiovascular:     Rate and Rhythm: Normal rate and regular rhythm.     Heart sounds: Normal heart sounds. No murmur heard.    No gallop.  Pulmonary:     Effort: Pulmonary effort is normal. No respiratory distress.     Breath sounds: Normal breath sounds. No wheezing or rales.  Abdominal:     General: Bowel sounds are normal. There is no distension.     Palpations: Abdomen is soft.     Tenderness: There is no abdominal tenderness. There is no guarding.  Skin:    General: Skin is warm and dry.  Neurological:     Mental Status: She is alert and oriented to person, place, and time.  Psychiatric:        Judgment: Judgment normal.     BP 128/88 (BP Location: Left Arm, Patient Position: Sitting, Cuff Size: Large)   Pulse 77   Temp 98.2 F (36.8 C) (Oral)   Resp 18   Ht 5' 5" (1.651 m)   Wt 252 lb (114.3 kg)   LMP 07/15/2021 (Approximate) Comment: has IUD  SpO2 98%   BMI 41.93 kg/m  Wt Readings from Last 3 Encounters:  08/12/21 252 lb (114.3 kg)  07/25/21 252 lb (114.3 kg)   07/11/21 255 lb (115.7 kg)    Diabetic Foot Exam - Simple   No data filed    Lab Results  Component Value Date   WBC 12.2 (H) 08/27/2020   HGB 13.4 08/27/2020   HCT 38.4 08/27/2020  PLT 325 08/27/2020   GLUCOSE 110 (H) 09/04/2020   CHOL 175 10/10/2019   TRIG 51.0 10/10/2019   HDL 71.00 10/10/2019   LDLCALC 94 10/10/2019   ALT 35 (H) 09/04/2020   AST 23 09/04/2020   NA 139 09/04/2020   K 4.4 09/04/2020   CL 102 09/04/2020   CREATININE 0.87 09/04/2020   BUN 12 09/04/2020   CO2 20 09/04/2020   TSH 1.44 10/10/2019   HGBA1C 5.5 01/02/2021    Lab Results  Component Value Date   TSH 1.44 10/10/2019   Lab Results  Component Value Date   WBC 12.2 (H) 08/27/2020   HGB 13.4 08/27/2020   HCT 38.4 08/27/2020   MCV 94.6 08/27/2020   PLT 325 08/27/2020   Lab Results  Component Value Date   NA 139 09/04/2020   K 4.4 09/04/2020   CO2 20 09/04/2020   GLUCOSE 110 (H) 09/04/2020   BUN 12 09/04/2020   CREATININE 0.87 09/04/2020   BILITOT 0.6 09/04/2020   ALKPHOS 126 (H) 09/04/2020   AST 23 09/04/2020   ALT 35 (H) 09/04/2020   PROT 6.8 09/04/2020   ALBUMIN 3.9 09/04/2020   CALCIUM 10.0 09/04/2020   ANIONGAP 15 08/27/2020   EGFR 85 09/04/2020   GFR 96.76 10/10/2019   Lab Results  Component Value Date   CHOL 175 10/10/2019   Lab Results  Component Value Date   HDL 71.00 10/10/2019   Lab Results  Component Value Date   LDLCALC 94 10/10/2019   Lab Results  Component Value Date   TRIG 51.0 10/10/2019   Lab Results  Component Value Date   CHOLHDL 2 10/10/2019   Lab Results  Component Value Date   HGBA1C 5.5 01/02/2021   Mammogram- Last completed 10/17/2019. Results are normal. Repeat in 1 year.  Dexa- Last completed 10/16/2019. Results showed she has osteoporosis. Repeat in 2 years.  Pap smear- Last completed 07/25/2021. Results are normal. Repeat in 3 years.      Assessment & Plan:   Problem List Items Addressed This Visit       Unprioritized    Preventative health care - Primary    ghm utd Check labs  See avs       Relevant Orders   Thyroid Panel With TSH   CBC with Differential/Platelet   Comprehensive metabolic panel   Lipid panel   TSH   Vitamin B12   VITAMIN D 25 Hydroxy (Vit-D Deficiency, Fractures)   Inguinal pain    [name] normal Check xrays       Relevant Orders   Thyroid Panel With TSH   CBC with Differential/Platelet   Comprehensive metabolic panel   Lipid panel   TSH   Vitamin B12   VITAMIN D 25 Hydroxy (Vit-D Deficiency, Fractures)   DG Lumbar Spine Complete   DG HIPS BILAT W OR W/O PELVIS 2V     No orders of the defined types were placed in this encounter.   IAnn Held, DO, personally preformed the services described in this documentation.  All medical record entries made by the scribe were at my direction and in my presence.  I have reviewed the chart and discharge instructions (if applicable) and agree that the record reflects my personal performance and is accurate and complete. 08/12/2021   I,Jody Bryan,acting as a Education administrator for [employer], DO.,have documented all relevant documentation on the behalf of [name], DO,as directed by  [name],  DO while in the presence of [name], DO.   [name], DO

## 2021-08-12 NOTE — Assessment & Plan Note (Signed)
ghm utd Check labs  See avs  

## 2021-08-12 NOTE — Patient Instructions (Signed)
Preventive Care 40-43 Years Old, Female Preventive care refers to lifestyle choices and visits with your health care provider that can promote health and wellness. Preventive care visits are also called wellness exams. What can I expect for my preventive care visit? Counseling Your health care provider may ask you questions about your: Medical history, including: Past medical problems. Family medical history. Pregnancy history. Current health, including: Menstrual cycle. Method of birth control. Emotional well-being. Home life and relationship well-being. Sexual activity and sexual health. Lifestyle, including: Alcohol, nicotine or tobacco, and drug use. Access to firearms. Diet, exercise, and sleep habits. Work and work environment. Sunscreen use. Safety issues such as seatbelt and bike helmet use. Physical exam Your health care provider will check your: Height and weight. These may be used to calculate your BMI (body mass index). BMI is a measurement that tells if you are at a healthy weight. Waist circumference. This measures the distance around your waistline. This measurement also tells if you are at a healthy weight and may help predict your risk of certain diseases, such as type 2 diabetes and high blood pressure. Heart rate and blood pressure. Body temperature. Skin for abnormal spots. What immunizations do I need?  Vaccines are usually given at various ages, according to a schedule. Your health care provider will recommend vaccines for you based on your age, medical history, and lifestyle or other factors, such as travel or where you work. What tests do I need? Screening Your health care provider may recommend screening tests for certain conditions. This may include: Lipid and cholesterol levels. Diabetes screening. This is done by checking your blood sugar (glucose) after you have not eaten for a while (fasting). Pelvic exam and Pap test. Hepatitis B test. Hepatitis C  test. HIV (human immunodeficiency virus) test. STI (sexually transmitted infection) testing, if you are at risk. Lung cancer screening. Colorectal cancer screening. Mammogram. Talk with your health care provider about when you should start having regular mammograms. This may depend on whether you have a family history of breast cancer. BRCA-related cancer screening. This may be done if you have a family history of breast, ovarian, tubal, or peritoneal cancers. Bone density scan. This is done to screen for osteoporosis. Talk with your health care provider about your test results, treatment options, and if necessary, the need for more tests. Follow these instructions at home: Eating and drinking  Eat a diet that includes fresh fruits and vegetables, whole grains, lean protein, and low-fat dairy products. Take vitamin and mineral supplements as recommended by your health care provider. Do not drink alcohol if: Your health care provider tells you not to drink. You are pregnant, may be pregnant, or are planning to become pregnant. If you drink alcohol: Limit how much you have to 0-1 drink a day. Know how much alcohol is in your drink. In the [place] one drink equals one 12 oz bottle of beer (355 mL), one 5 oz glass of wine (148 mL), or one 1 oz glass of hard liquor (44 mL). Lifestyle Brush your teeth every morning and night with fluoride toothpaste. Floss one time each day. Exercise for at least 30 minutes 5 or more days each week. Do not use any products that contain nicotine or tobacco. These products include cigarettes, chewing tobacco, and vaping devices, such as e-cigarettes. If you need help quitting, ask your health care provider. Do not use drugs. If you are sexually active, practice safe sex. Use a condom or other form of protection to   prevent STIs. If you do not wish to become pregnant, use a form of birth control. If you plan to become pregnant, see your health care provider for a  prepregnancy visit. Take aspirin only as told by your health care provider. Make sure that you understand how much to take and what form to take. Work with your health care provider to find out whether it is safe and beneficial for you to take aspirin daily. Find healthy ways to manage stress, such as: Meditation, yoga, or listening to music. Journaling. Talking to a trusted person. Spending time with friends and family. Minimize exposure to UV radiation to reduce your risk of skin cancer. Safety Always wear your seat belt while driving or riding in a vehicle. Do not drive: If you have been drinking alcohol. Do not ride with someone who has been drinking. When you are tired or distracted. While texting. If you have been using any mind-altering substances or drugs. Wear a helmet and other protective equipment during sports activities. If you have firearms in your house, make sure you follow all gun safety procedures. Seek help if you have been physically or sexually abused. What's next? Visit your health care provider once a year for an annual wellness visit. Ask your health care provider how often you should have your eyes and teeth checked. Stay up to date on all vaccines. This information is not intended to replace advice given to you by your health care provider. Make sure you discuss any questions you have with your health care provider. Document Revised: 08/14/2020 Document Reviewed: 08/14/2020 Elsevier Patient Education  [place].

## 2021-08-12 NOTE — Assessment & Plan Note (Signed)
[name] normal Check xrays

## 2021-08-13 LAB — CBC WITH DIFFERENTIAL/PLATELET
Basophils Absolute: 0.1 10*3/uL (ref 0.0–0.1)
Basophils Relative: 1 % (ref 0.0–3.0)
Eosinophils Absolute: 0.1 10*3/uL (ref 0.0–0.7)
Eosinophils Relative: 1.2 % (ref 0.0–5.0)
HCT: 43.4 % (ref 36.0–46.0)
Hemoglobin: 14.5 g/dL (ref 12.0–15.0)
Lymphocytes Relative: 28.4 % (ref 12.0–46.0)
Lymphs Abs: 2.3 10*3/uL (ref 0.7–4.0)
MCHC: 33.4 g/dL (ref 30.0–36.0)
MCV: 90.9 fl (ref 78.0–100.0)
Monocytes Absolute: 0.5 10*3/uL (ref 0.1–1.0)
Monocytes Relative: 6.5 % (ref 3.0–12.0)
Neutro Abs: 5 10*3/uL (ref 1.4–7.7)
Neutrophils Relative %: 62.9 % (ref 43.0–77.0)
Platelets: 331 10*3/uL (ref 150.0–400.0)
RBC: 4.77 Mil/uL (ref 3.87–5.11)
RDW: 13.7 % (ref 11.5–15.5)
WBC: 8 10*3/uL (ref 4.0–10.5)

## 2021-08-13 LAB — COMPREHENSIVE METABOLIC PANEL
ALT: 20 U/L (ref 0–35)
AST: 13 U/L (ref 0–37)
Albumin: 4.4 g/dL (ref 3.5–5.2)
Alkaline Phosphatase: 80 U/L (ref 39–117)
BUN: 14 mg/dL (ref 6–23)
CO2: 27 mEq/L (ref 19–32)
Calcium: 9.7 mg/dL (ref 8.4–10.5)
Chloride: 108 mEq/L (ref 96–112)
Creatinine, Ser: 0.71 mg/dL (ref 0.40–1.20)
GFR: 104.12 mL/min (ref >60.00)
Glucose, Bld: 90 mg/dL (ref 70–99)
Potassium: 4.5 mEq/L (ref 3.5–5.1)
Sodium: 141 mEq/L (ref 135–145)
Total Bilirubin: 0.8 mg/dL (ref 0.2–1.2)
Total Protein: 7.2 g/dL (ref 6.0–8.3)

## 2021-08-13 LAB — THYROID PANEL WITH TSH
Free Thyroxine Index: 2.4 (ref 1.4–3.8)
T3 Uptake: 31 % (ref 22–35)
T4, Total: 7.6 ug/dL (ref 5.1–11.9)
TSH: 1.14 mIU/L

## 2021-08-13 LAB — LIPID PANEL
Cholesterol: 158 mg/dL (ref 0–200)
HDL: 49.4 mg/dL (ref >39.00)
LDL Cholesterol: 99 mg/dL (ref 0–99)
NonHDL: 108.98
Total CHOL/HDL Ratio: 3
Triglycerides: 50 mg/dL (ref 0.0–149.0)
VLDL: 10 mg/dL (ref 0.0–40.0)

## 2021-08-13 LAB — VITAMIN D 25 HYDROXY (VIT D DEFICIENCY, FRACTURES): VITD: 24.61 ng/mL — ABNORMAL LOW (ref 30.00–100.00)

## 2021-08-13 LAB — TSH: TSH: 1.1 u[IU]/mL (ref 0.35–5.50)

## 2021-08-13 LAB — VITAMIN B12: Vitamin B-12: 427 pg/mL (ref 211–911)

## 2021-08-18 ENCOUNTER — Other Ambulatory Visit: Payer: Self-pay

## 2021-08-18 MED ORDER — VITAMIN D (ERGOCALCIFEROL) 1.25 MG (50000 UNIT) PO CAPS: 50000.0000 [IU] | ORAL_CAPSULE | ORAL | 1 refills | Status: DC

## 2021-10-20 ENCOUNTER — Telehealth: Payer: Self-pay | Admitting: Family Medicine

## 2021-10-20 DIAGNOSIS — Z1231 Encounter for screening mammogram for malignant neoplasm of breast: Secondary | ICD-10-CM

## 2021-10-20 NOTE — Telephone Encounter (Signed)
Pt called stating she would like to have an order put in for her to do a routine mammogram.

## 2021-10-21 NOTE — Telephone Encounter (Signed)
Okay to place order? °

## 2021-10-21 NOTE — Telephone Encounter (Signed)
Orders placed. Pt states her insurance will be running out soon and she wanted to know if there was anything else that should be done like a colonoscopy? Please advise

## 2021-10-22 NOTE — Telephone Encounter (Signed)
Spoke with patient. Pt verbalized understanding  °

## 2021-10-23 ENCOUNTER — Ambulatory Visit (HOSPITAL_BASED_OUTPATIENT_CLINIC_OR_DEPARTMENT_OTHER)
Admission: RE | Admit: 2021-10-23 | Discharge: 2021-10-23 | Disposition: A | Discharge status: Home / Self Care | Payer: 59 | Source: Ambulatory Visit | Attending: Obstetrics and Gynecology | Admitting: Obstetrics and Gynecology

## 2021-10-23 DIAGNOSIS — N83202 Unspecified ovarian cyst, left side: Secondary | ICD-10-CM

## 2021-10-27 ENCOUNTER — Encounter: Payer: Self-pay | Admitting: Family Medicine

## 2021-10-29 ENCOUNTER — Other Ambulatory Visit: Payer: Self-pay | Admitting: Obstetrics & Gynecology

## 2021-10-29 ENCOUNTER — Telehealth: Payer: Self-pay

## 2021-10-29 DIAGNOSIS — N83202 Unspecified ovarian cyst, left side: Secondary | ICD-10-CM

## 2021-10-29 NOTE — Telephone Encounter (Signed)
-----   Message from Tereso Newcomer, MD sent at 10/29/2021  2:24 PM EDT ----- Left ovarian cyst is a little bigger and looks more complex. MRI recommended for further characterization. This was ordered for patient. Please help schedule for patient.  Please call to inform patient of results and recommendations.

## 2021-10-29 NOTE — Telephone Encounter (Signed)
Patient called and made aware that she needs an MRI scheduled. Informed patient we will have to get prior authorization and then call her back to scheduled MRI. Victorino Dike [facility]

## 2021-10-30 ENCOUNTER — Encounter: Payer: Self-pay | Admitting: Obstetrics & Gynecology

## 2021-10-31 ENCOUNTER — Other Ambulatory Visit: Payer: Self-pay

## 2021-10-31 MED ORDER — CYCLOBENZAPRINE HCL 10 MG PO TABS
10.0000 mg | ORAL_TABLET | Freq: Three times a day (TID) | ORAL | 0 refills | Status: DC | PRN
Start: 2021-10-31 — End: 2022-05-18

## 2021-11-10 ENCOUNTER — Telehealth: Payer: Self-pay

## 2021-11-10 ENCOUNTER — Ambulatory Visit (INDEPENDENT_AMBULATORY_CARE_PROVIDER_SITE_OTHER): Payer: 59

## 2021-11-10 DIAGNOSIS — D259 Leiomyoma of uterus, unspecified: Secondary | ICD-10-CM | POA: Diagnosis not present

## 2021-11-10 DIAGNOSIS — I862 Pelvic varices: Secondary | ICD-10-CM | POA: Diagnosis not present

## 2021-11-10 DIAGNOSIS — N83202 Unspecified ovarian cyst, left side: Secondary | ICD-10-CM | POA: Diagnosis not present

## 2021-11-10 MED ORDER — GADOBUTROL 1 MMOL/ML IV SOLN
10.0000 mL | Freq: Once | INTRAVENOUS | Status: AC | PRN
  Administered 2021-11-10: 10 mL via INTRAVENOUS

## 2021-11-10 NOTE — Telephone Encounter (Signed)
-----   Message from Tereso Newcomer, MD sent at 11/10/2021  4:00 PM EDT ----- Total size complex cyst is 2.8 cm. No acute surgical intervention is needed for this cyst, need to follow up in about 6 months.  Pelvic congestion as seen on MRI can cause pain occasionally, due to engorged blood vessels. Usually patients with this have chronic pain for months/years, not just a few weeks.   If pain continues, may need referral to Interventional radiology for endovascular therapy.  IUD is in proper position.  For now, continue taking over the counter pain medications such as Ibuprofen or Naproxen as needed for pain.   Please call to inform patient of results and recommendations. Results were also released to MyChart and patient was given recommendations as indicated.

## 2021-11-10 NOTE — Telephone Encounter (Signed)
Called pt to inform her [name] results. Pt made aware that her IUD is in the proper place and that the pain she is having does not require surgery. Pt also made aware that if the pain continues, she may need a referral to interventional radiology for endovascular therapy. Advised pt to continue to take over the counter ibuprofen or Naproxen for pain. Understanding was voiced. Oluwatamilore Starnes l Oluwatosin Higginson, CMA

## 2021-12-04 ENCOUNTER — Ambulatory Visit (INDEPENDENT_AMBULATORY_CARE_PROVIDER_SITE_OTHER): Payer: 59

## 2021-12-04 DIAGNOSIS — Z1231 Encounter for screening mammogram for malignant neoplasm of breast: Secondary | ICD-10-CM

## 2022-02-22 DIAGNOSIS — H5213 Myopia, bilateral: Secondary | ICD-10-CM | POA: Diagnosis not present

## 2022-02-26 ENCOUNTER — Other Ambulatory Visit: Payer: Self-pay | Admitting: Family Medicine

## 2022-03-06 ENCOUNTER — Telehealth: Payer: Self-pay | Admitting: General Practice

## 2022-03-06 NOTE — Telephone Encounter (Signed)
-----   Message from Maurine Minister, [place] sent at 11/10/2021  4:45 PM EDT ----- Regarding: Follow up 6 month follow up with Dr. Currie Paris around March 2024.

## 2022-03-06 NOTE — Telephone Encounter (Signed)
Left message on VM for pt to contact our office to schedule 6 month follow up with Dr. Currie Paris around March 2024.

## 2022-05-04 DIAGNOSIS — M9903 Segmental and somatic dysfunction of lumbar region: Secondary | ICD-10-CM | POA: Diagnosis not present

## 2022-05-04 DIAGNOSIS — M9904 Segmental and somatic dysfunction of sacral region: Secondary | ICD-10-CM | POA: Diagnosis not present

## 2022-05-04 DIAGNOSIS — M5386 Other specified dorsopathies, lumbar region: Secondary | ICD-10-CM | POA: Diagnosis not present

## 2022-05-04 DIAGNOSIS — M9905 Segmental and somatic dysfunction of pelvic region: Secondary | ICD-10-CM | POA: Diagnosis not present

## 2022-05-07 DIAGNOSIS — M9903 Segmental and somatic dysfunction of lumbar region: Secondary | ICD-10-CM | POA: Diagnosis not present

## 2022-05-07 DIAGNOSIS — M9905 Segmental and somatic dysfunction of pelvic region: Secondary | ICD-10-CM | POA: Diagnosis not present

## 2022-05-07 DIAGNOSIS — M5386 Other specified dorsopathies, lumbar region: Secondary | ICD-10-CM | POA: Diagnosis not present

## 2022-05-07 DIAGNOSIS — M9904 Segmental and somatic dysfunction of sacral region: Secondary | ICD-10-CM | POA: Diagnosis not present

## 2022-05-14 DIAGNOSIS — M9903 Segmental and somatic dysfunction of lumbar region: Secondary | ICD-10-CM | POA: Diagnosis not present

## 2022-05-14 DIAGNOSIS — M9905 Segmental and somatic dysfunction of pelvic region: Secondary | ICD-10-CM | POA: Diagnosis not present

## 2022-05-14 DIAGNOSIS — M5386 Other specified dorsopathies, lumbar region: Secondary | ICD-10-CM | POA: Diagnosis not present

## 2022-05-14 DIAGNOSIS — M9904 Segmental and somatic dysfunction of sacral region: Secondary | ICD-10-CM | POA: Diagnosis not present

## 2022-05-18 ENCOUNTER — Encounter: Payer: Self-pay | Admitting: Family Medicine

## 2022-05-18 ENCOUNTER — Ambulatory Visit (INDEPENDENT_AMBULATORY_CARE_PROVIDER_SITE_OTHER): Payer: Medicaid Other | Admitting: Family Medicine

## 2022-05-18 VITALS — BP 130/86 | HR 91 | Temp 98.4°F | Resp 18 | Ht 65.0 in | Wt 248.6 lb

## 2022-05-18 DIAGNOSIS — E559 Vitamin D deficiency, unspecified: Secondary | ICD-10-CM

## 2022-05-18 DIAGNOSIS — R5383 Other fatigue: Secondary | ICD-10-CM

## 2022-05-18 DIAGNOSIS — M9905 Segmental and somatic dysfunction of pelvic region: Secondary | ICD-10-CM | POA: Diagnosis not present

## 2022-05-18 DIAGNOSIS — R7303 Prediabetes: Secondary | ICD-10-CM

## 2022-05-18 DIAGNOSIS — G8929 Other chronic pain: Secondary | ICD-10-CM | POA: Diagnosis not present

## 2022-05-18 DIAGNOSIS — M5386 Other specified dorsopathies, lumbar region: Secondary | ICD-10-CM | POA: Diagnosis not present

## 2022-05-18 DIAGNOSIS — M545 Low back pain, unspecified: Secondary | ICD-10-CM | POA: Diagnosis not present

## 2022-05-18 DIAGNOSIS — E538 Deficiency of other specified B group vitamins: Secondary | ICD-10-CM

## 2022-05-18 DIAGNOSIS — M9904 Segmental and somatic dysfunction of sacral region: Secondary | ICD-10-CM | POA: Diagnosis not present

## 2022-05-18 DIAGNOSIS — M9903 Segmental and somatic dysfunction of lumbar region: Secondary | ICD-10-CM | POA: Diagnosis not present

## 2022-05-18 DIAGNOSIS — Z0000 Encounter for general adult medical examination without abnormal findings: Secondary | ICD-10-CM

## 2022-05-18 DIAGNOSIS — Z975 Presence of (intrauterine) contraceptive device: Secondary | ICD-10-CM | POA: Diagnosis not present

## 2022-05-18 MED ORDER — CYCLOBENZAPRINE HCL 10 MG PO TABS: 10.0000 mg | ORAL_TABLET | Freq: Three times a day (TID) | ORAL | 0 refills | Status: DC | PRN

## 2022-05-18 MED ORDER — VITAMIN D (ERGOCALCIFEROL) 1.25 MG (50000 UNIT) PO CAPS: 50000.0000 [IU] | ORAL_CAPSULE | ORAL | 1 refills | Status: DC

## 2022-05-18 MED ORDER — LEVONORGESTREL 20 MCG/DAY IU IUD: 1.0000 | INTRAUTERINE_SYSTEM | Freq: Once | INTRAUTERINE | 0 refills | Status: DC

## 2022-05-18 NOTE — Assessment & Plan Note (Signed)
Check labs 

## 2022-05-18 NOTE — Progress Notes (Addendum)
Subjective:   By signing my name below, I, [name], attest that this documentation has been prepared under the direction and in the presence of [name], DO. 05/18/2022   Patient ID: Jody Bryan, female    DOB: 26-Nov-1980, 42 y.o.   MRN: [id number]  Chief Complaint  Patient presents with   Back Pain   Anemia   Fatigue    HPI Patient is in today for a office visit.  She complains of watery left eye and congestion. She is trying Claritin and finds no change in her symptoms.  She also complains of fatigue and low energy. She has some factors in her daily life that might be contributing to it. She reports not feeling rested after sleeping. She has broken sleep due to her 18 month child having frequent ear infections.  She complains of pain in both her great toe while walking for the past 1.5 months. She denies having any swelling. She is seeing a chiropractor regularly and reports it may be due to her shoes during daily wear. She has since switched to sneakers for daily wear. Prior to that she was wearing flat shoes.  She complains of low back pain. She had shooting tingling pain radiating down her leg but her symptoms improved since she started seeing a chiropractor.   Past Medical History:  Diagnosis Date   Bilateral leg edema    Chicken pox    Depression    Drug use    Dyspnea    GERD (gastroesophageal reflux disease)    Low back pain     Past Surgical History:  Procedure Laterality Date   WISDOM TOOTH EXTRACTION  08/2019    Family History  Problem Relation Age of Onset   Hypertension Mother    Obesity Mother    Hypertension Father    Depression Father    Sleep apnea Father    Drug abuse Father    Obesity Father    Arthritis Paternal Grandmother    Cancer Paternal Grandmother        breast   Alcohol abuse Sister    Stroke Sister    Heart disease Maternal Grandmother    Alcohol abuse Maternal Grandfather     Social History   Socioeconomic  History   Marital status: Single    Spouse name: Not on file   Number of children: 1   Years of education: Not on file   Highest education level: Not on file  Occupational History   Occupation:  call center  Tobacco Use   Smoking status: Former    Years: 2    Types: Cigarettes   Smokeless tobacco: Never  Vaping Use   Vaping Use: Never used  Substance and Sexual Activity   Alcohol use: Yes    Alcohol/week: 0.0 standard drinks of alcohol    Comment: social   Drug use: Not Currently   Sexual activity: Yes    Birth control/protection: I.U.D.  Other Topics Concern   Not on file  Social History Narrative   Exercise ---  5 days a week for 40 min   Social Determinants of Health   Financial Resource Strain: Not on file  Food Insecurity: Not on file  Transportation Needs: Not on file  Physical Activity: Not on file  Stress: Not on file  Social Connections: Not on file  Intimate Partner Violence: Not on file    Outpatient Medications Prior to Visit  Medication Sig Dispense Refill   Vitamin D,  Ergocalciferol, (DRISDOL) 1.25 MG (50000 UNIT) CAPS capsule Take 1 capsule (50,000 Units total) by mouth every 7 (seven) days. 12 capsule 1   cyclobenzaprine (FLEXERIL) 10 MG tablet Take 1 tablet (10 mg total) by mouth every 8 (eight) hours as needed for muscle spasms. Take one tablet before procedure. (Patient not taking: Reported on 05/18/2022) 2 tablet 0   No facility-administered medications prior to visit.    No Known Allergies  Review of Systems  Constitutional:  Positive for malaise/fatigue. Negative for fever.  HENT:  Positive for congestion.   Eyes:  Negative for blurred vision.       (+)watery eyes  Respiratory:  Negative for shortness of breath.   Cardiovascular:  Negative for chest pain, palpitations and leg swelling.  Gastrointestinal:  Negative for abdominal pain, blood in stool and nausea.  Genitourinary:  Negative for dysuria and frequency.  Musculoskeletal:  Positive  for back pain (lower back pain). Negative for falls.       (+)pain in bilateral great toes while walking (-)swelling in great toes  Skin:  Negative for rash.  Neurological:  Negative for dizziness, loss of consciousness and headaches.  Endo/Heme/Allergies:  Negative for environmental allergies.  Psychiatric/Behavioral:  Negative for depression. The patient is not nervous/anxious.        Objective:    Physical Exam Constitutional:      General: She is not in acute distress.    Appearance: Normal appearance. She is not ill-appearing.  HENT:     Head: Normocephalic and atraumatic.     Right Ear: External ear normal.     Left Ear: External ear normal.  Eyes:     Extraocular Movements: Extraocular movements intact.     Pupils: Pupils are equal, round, and reactive to light.  Cardiovascular:     Rate and Rhythm: Normal rate and regular rhythm.     Heart sounds: Normal heart sounds. No murmur heard.    No gallop.  Pulmonary:     Effort: Pulmonary effort is normal. No respiratory distress.     Breath sounds: Normal breath sounds. No wheezing or rales.  Musculoskeletal:     Comments: 5/5 strength in lower extremities.   Skin:    General: Skin is warm and dry.  Neurological:     Mental Status: She is alert and oriented to person, place, and time.  Psychiatric:        Judgment: Judgment normal.     BP 130/86 (BP Location: Right Arm, Patient Position: Sitting, Cuff Size: Large)   Pulse 91   Temp 98.4 F (36.9 C) (Oral)   Resp 18   Ht 5\' 5"  (1.651 m)   Wt 248 lb 9.6 oz (112.8 kg)   SpO2 97%   BMI 41.37 kg/m  Wt Readings from Last 3 Encounters:  05/18/22 248 lb 9.6 oz (112.8 kg)  08/12/21 252 lb (114.3 kg)  07/25/21 252 lb (114.3 kg)       Assessment & Plan:  Preventative health care -     CBC with Differential/Platelet -     Comprehensive metabolic panel -     Lipid panel -     TSH  Morbid obesity (HCC) Assessment & Plan: Pt is exercising and working on diet     Other fatigue Assessment & Plan: Check labs  Con't vita d and add otc vita d3 1000 daily   Orders: -     VITAMIN D 25 Hydroxy (Vit-D Deficiency, Fractures)  Vitamin B12 deficiency Assessment & Plan:  Check labs   Orders: -     Vitamin B12  Vitamin D deficiency Assessment & Plan: Check labs   Orders: -     Vitamin D (Ergocalciferol); Take 1 capsule (50,000 Units total) by mouth every 7 (seven) days.  Dispense: 12 capsule; Refill: 1  Contraception, device intrauterine -     Levonorgestrel; 1 each by Intrauterine route once for 1 dose.  Dispense: 1 each; Refill: 0  Chronic midline low back pain without sciatica Assessment & Plan: Muscle relaxer as needed Tylenol or nsaid as needed  Ice / heat  Return to office as needed   Orders: -     Cyclobenzaprine HCl; Take 1 tablet (10 mg total) by mouth every 8 (eight) hours as needed for muscle spasms. Take one tablet before procedure.  Dispense: 2 tablet; Refill: 0  Prediabetes Assessment & Plan: Check labs       I, [name], DO, personally preformed the services described in this documentation.  All medical record entries made by the scribe were at my direction and in my presence.  I have reviewed the chart and discharge instructions (if applicable) and agree that the record reflects my personal performance and is accurate and complete. 05/18/2022   I,[name],acting as a scribe for [name], DO.,have documented all relevant documentation on the behalf of [name], DO,as directed by  [name], DO while in the presence of [name], DO.   [name], DO

## 2022-05-18 NOTE — Assessment & Plan Note (Signed)
Muscle relaxer as needed Tylenol or nsaid as needed  Ice / heat  Return to office as needed

## 2022-05-18 NOTE — Patient Instructions (Signed)

## 2022-05-18 NOTE — Assessment & Plan Note (Signed)
Check labs  Con't vita d and add otc vita d3 1000 daily

## 2022-05-18 NOTE — Assessment & Plan Note (Signed)
Pt is exercising and working on diet

## 2022-05-19 LAB — CBC WITH DIFFERENTIAL/PLATELET
Basophils Absolute: 0.2 10*3/uL — ABNORMAL HIGH (ref 0.0–0.1)
Basophils Relative: 1.1 % (ref 0.0–3.0)
Eosinophils Absolute: 0.1 10*3/uL (ref 0.0–0.7)
Eosinophils Relative: 0.8 % (ref 0.0–5.0)
HCT: 45.4 % (ref 36.0–46.0)
Hemoglobin: 15 g/dL (ref 12.0–15.0)
Lymphocytes Relative: 22.8 % (ref 12.0–46.0)
Lymphs Abs: 3.1 10*3/uL (ref 0.7–4.0)
MCHC: 33 g/dL (ref 30.0–36.0)
MCV: 91.9 fl (ref 78.0–100.0)
Monocytes Absolute: 1.1 10*3/uL — ABNORMAL HIGH (ref 0.1–1.0)
Monocytes Relative: 7.9 % (ref 3.0–12.0)
Neutro Abs: 9.3 10*3/uL — ABNORMAL HIGH (ref 1.4–7.7)
Neutrophils Relative %: 67.4 % (ref 43.0–77.0)
Platelets: 392 10*3/uL (ref 150.0–400.0)
RBC: 4.94 Mil/uL (ref 3.87–5.11)
RDW: 13.7 % (ref 11.5–15.5)
WBC: 13.7 10*3/uL — ABNORMAL HIGH (ref 4.0–10.5)

## 2022-05-19 LAB — COMPREHENSIVE METABOLIC PANEL
ALT: 14 U/L (ref 0–35)
AST: 14 U/L (ref 0–37)
Albumin: 4.3 g/dL (ref 3.5–5.2)
Alkaline Phosphatase: 73 U/L (ref 39–117)
BUN: 19 mg/dL (ref 6–23)
CO2: 25 mEq/L (ref 19–32)
Calcium: 9.8 mg/dL (ref 8.4–10.5)
Chloride: 103 mEq/L (ref 96–112)
Creatinine, Ser: 0.79 mg/dL (ref 0.40–1.20)
GFR: 91.11 mL/min (ref >60.00)
Glucose, Bld: 81 mg/dL (ref 70–99)
Potassium: 4.1 mEq/L (ref 3.5–5.1)
Sodium: 140 mEq/L (ref 135–145)
Total Bilirubin: 0.7 mg/dL (ref 0.2–1.2)
Total Protein: 7.5 g/dL (ref 6.0–8.3)

## 2022-05-19 LAB — LIPID PANEL
Cholesterol: 173 mg/dL (ref 0–200)
HDL: 61.7 mg/dL (ref >39.00)
LDL Cholesterol: 86 mg/dL (ref 0–99)
NonHDL: 110.99
Total CHOL/HDL Ratio: 3
Triglycerides: 127 mg/dL (ref 0.0–149.0)
VLDL: 25.4 mg/dL (ref 0.0–40.0)

## 2022-05-19 LAB — VITAMIN B12: Vitamin B-12: 307 pg/mL (ref 211–911)

## 2022-05-19 LAB — TSH: TSH: 1.38 u[IU]/mL (ref 0.35–5.50)

## 2022-05-19 LAB — VITAMIN D 25 HYDROXY (VIT D DEFICIENCY, FRACTURES): VITD: 17.44 ng/mL — ABNORMAL LOW (ref 30.00–100.00)

## 2022-05-21 ENCOUNTER — Other Ambulatory Visit: Payer: Self-pay | Admitting: Family Medicine

## 2022-05-21 DIAGNOSIS — M9904 Segmental and somatic dysfunction of sacral region: Secondary | ICD-10-CM | POA: Diagnosis not present

## 2022-05-21 DIAGNOSIS — E559 Vitamin D deficiency, unspecified: Secondary | ICD-10-CM

## 2022-05-21 DIAGNOSIS — M9905 Segmental and somatic dysfunction of pelvic region: Secondary | ICD-10-CM | POA: Diagnosis not present

## 2022-05-21 DIAGNOSIS — M9903 Segmental and somatic dysfunction of lumbar region: Secondary | ICD-10-CM | POA: Diagnosis not present

## 2022-05-21 DIAGNOSIS — M5386 Other specified dorsopathies, lumbar region: Secondary | ICD-10-CM | POA: Diagnosis not present

## 2022-05-25 ENCOUNTER — Other Ambulatory Visit: Payer: Self-pay | Admitting: Family Medicine

## 2022-05-25 ENCOUNTER — Encounter: Payer: Self-pay | Admitting: Family Medicine

## 2022-05-25 DIAGNOSIS — R931 Abnormal findings on diagnostic imaging of heart and coronary circulation: Secondary | ICD-10-CM

## 2022-05-25 DIAGNOSIS — M5386 Other specified dorsopathies, lumbar region: Secondary | ICD-10-CM | POA: Diagnosis not present

## 2022-05-25 DIAGNOSIS — M9903 Segmental and somatic dysfunction of lumbar region: Secondary | ICD-10-CM | POA: Diagnosis not present

## 2022-05-25 DIAGNOSIS — M9905 Segmental and somatic dysfunction of pelvic region: Secondary | ICD-10-CM | POA: Diagnosis not present

## 2022-05-25 DIAGNOSIS — M9904 Segmental and somatic dysfunction of sacral region: Secondary | ICD-10-CM | POA: Diagnosis not present

## 2022-05-26 ENCOUNTER — Telehealth: Payer: Self-pay | Admitting: Family Medicine

## 2022-05-26 NOTE — Telephone Encounter (Signed)
Please advise 

## 2022-05-26 NOTE — Telephone Encounter (Signed)
Can we cancel this referral please

## 2022-05-26 NOTE — Telephone Encounter (Signed)
Pt wants to know why she was referred to a cardiologist. She sent mychart message earlier as well. Please call to discuss.

## 2022-05-26 NOTE — Telephone Encounter (Signed)
Mychart message was sent earlier. FYI

## 2022-05-27 DIAGNOSIS — M9904 Segmental and somatic dysfunction of sacral region: Secondary | ICD-10-CM | POA: Diagnosis not present

## 2022-05-27 DIAGNOSIS — M9905 Segmental and somatic dysfunction of pelvic region: Secondary | ICD-10-CM | POA: Diagnosis not present

## 2022-05-27 DIAGNOSIS — M5386 Other specified dorsopathies, lumbar region: Secondary | ICD-10-CM | POA: Diagnosis not present

## 2022-05-27 DIAGNOSIS — M9903 Segmental and somatic dysfunction of lumbar region: Secondary | ICD-10-CM | POA: Diagnosis not present

## 2022-05-28 ENCOUNTER — Encounter: Payer: Self-pay | Admitting: Family Medicine

## 2022-05-28 ENCOUNTER — Ambulatory Visit: Payer: Medicaid Other | Admitting: Family Medicine

## 2022-05-28 VITALS — BP 128/88 | HR 82 | Temp 98.7°F | Resp 18 | Ht 65.0 in | Wt 250.4 lb

## 2022-05-28 DIAGNOSIS — H1013 Acute atopic conjunctivitis, bilateral: Secondary | ICD-10-CM | POA: Diagnosis not present

## 2022-05-28 DIAGNOSIS — R339 Retention of urine, unspecified: Secondary | ICD-10-CM | POA: Diagnosis not present

## 2022-05-28 DIAGNOSIS — R350 Frequency of micturition: Secondary | ICD-10-CM | POA: Diagnosis not present

## 2022-05-28 LAB — POC URINALSYSI DIPSTICK (AUTOMATED)
Bilirubin, UA: NEGATIVE
Blood, UA: NEGATIVE
Glucose, UA: NEGATIVE
Ketones, UA: NEGATIVE
Nitrite, UA: NEGATIVE
Protein, UA: NEGATIVE
Spec Grav, UA: 1.02 (ref 1.010–1.025)
Urobilinogen, UA: 0.2 E.U./dL
pH, UA: 6 (ref 5.0–8.0)

## 2022-05-28 MED ORDER — CEPHALEXIN 500 MG PO CAPS: 500.0000 mg | ORAL_CAPSULE | Freq: Two times a day (BID) | ORAL | 0 refills | Status: DC

## 2022-05-28 MED ORDER — OLOPATADINE HCL 0.2 % OP SOLN: OPHTHALMIC | 12 refills | Status: DC

## 2022-05-28 NOTE — Progress Notes (Addendum)
Subjective:   By signing my name below, I, [name], attest that this documentation has been prepared under the direction and in the presence of [name], DO. 05/28/2022   Patient ID: Jody Bryan, female    DOB: 09-14-1980, 42 y.o.   MRN: [id number]  Chief Complaint  Patient presents with   Urinary Retention    Pt states not able fully empty bladder, cramping.    HPI Patient is in today for a office visit.   She complains of abdominal cramps and fatigue while and after urinating. She denies having any burning while urinating, flank pain, fevers. 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 She has been taking claritin since her last visit daily for allergies. She continues having watery eyes and waking up with crusty eyes. She also has irritation from the watery eyes mostly in her left but has happened in her right.  She denies any discoloration on the crust build up on her eyes.    Past Medical History:  Diagnosis Date   Bilateral leg edema    Chicken pox    Depression    Drug use    Dyspnea    GERD (gastroesophageal reflux disease)    Low back pain     Past Surgical History:  Procedure Laterality Date   WISDOM TOOTH EXTRACTION  08/2019    Family History  Problem Relation Age of Onset   Hypertension Mother    Obesity Mother    Hypertension Father    Depression Father    Sleep apnea Father    Drug abuse Father    Obesity Father    Arthritis Paternal Grandmother    Cancer Paternal Grandmother        breast   Alcohol abuse Sister    Stroke Sister    Heart disease Maternal Grandmother    Alcohol abuse Maternal Grandfather     Social History   Socioeconomic History   Marital status: Single    Spouse name: Not on file   Number of children: 1   Years of education: Not on file   Highest education level: Not on file  Occupational History   Occupation:  call center  Tobacco Use   Smoking  status: Former    Years: 2    Types: Cigarettes   Smokeless tobacco: Never  Vaping Use   Vaping Use: Never used  Substance and Sexual Activity   Alcohol use: Yes    Alcohol/week: 0.0 standard drinks of alcohol    Comment: social   Drug use: Not Currently   Sexual activity: Yes    Birth control/protection: I.U.D.  Other Topics Concern   Not on file  Social History Narrative   Exercise ---  5 days a week for 40 min   Social Determinants of Health   Financial Resource Strain: Not on file  Food Insecurity: Not on file  Transportation Needs: Not on file  Physical Activity: Not on file  Stress: Not on file  Social Connections: Not on file  Intimate Partner Violence: Not on file    Outpatient Medications Prior to Visit  Medication Sig Dispense Refill   cyclobenzaprine (FLEXERIL) 10 MG tablet Take 1 tablet (10 mg total) by mouth every 8 (eight) hours as needed for muscle spasms. Take one tablet before procedure. 2 tablet 0   Vitamin D, Ergocalciferol, (DRISDOL) 1.25 MG (50000 UNIT) CAPS capsule Take 1 capsule (50,000 Units  total) by mouth every 7 (seven) days. 12 capsule 1   levonorgestrel (MIRENA) 20 MCG/DAY IUD 1 each by Intrauterine route once for 1 dose. 1 each 0   No facility-administered medications prior to visit.    No Known Allergies  Review of Systems  Constitutional:  Positive for malaise/fatigue (while and after urinating). Negative for fever.  HENT:  Negative for congestion.   Eyes:  Negative for blurred vision.       (+)watery eyes in the morning (L>R)  Respiratory:  Negative for shortness of breath.   Cardiovascular:  Negative for chest pain, palpitations and leg swelling.  Gastrointestinal:  Positive for abdominal pain (abdominal cramps). Negative for blood in stool and nausea.  Genitourinary:  Negative for dysuria, flank pain and frequency.  Musculoskeletal:  Negative for falls.  Skin:  Negative for rash.  Neurological:  Negative for dizziness, loss of  consciousness and headaches.  Endo/Heme/Allergies:  Negative for environmental allergies.  Psychiatric/Behavioral:  Negative for depression. The patient is not nervous/anxious.        Objective:    Physical Exam Vitals and nursing note reviewed.  Constitutional:      General: She is not in acute distress.    Appearance: Normal appearance. She is not ill-appearing.  HENT:     Head: Normocephalic and atraumatic.     Right Ear: External ear normal.     Left Ear: External ear normal.  Eyes:     Extraocular Movements: Extraocular movements intact.     Pupils: Pupils are equal, round, and reactive to light.  Cardiovascular:     Rate and Rhythm: Normal rate and regular rhythm.     Heart sounds: Normal heart sounds. No murmur heard.    No gallop.  Pulmonary:     Effort: Pulmonary effort is normal. No respiratory distress.     Breath sounds: Normal breath sounds. No wheezing or rales.  Abdominal:     General: There is no distension.     Palpations: Abdomen is soft.     Tenderness: There is no abdominal tenderness. There is no guarding.  Skin:    General: Skin is warm and dry.  Neurological:     Mental Status: She is alert and oriented to person, place, and time.  Psychiatric:        Judgment: Judgment normal.     BP 128/88 (BP Location: Left Arm, Patient Position: Sitting, Cuff Size: Large)   Pulse 82   Temp 98.7 F (37.1 C) (Oral)   Resp 18   Ht 5\' 5"  (1.651 m)   Wt 250 lb 6.4 oz (113.6 kg)   SpO2 97%   BMI 41.67 kg/m  Wt Readings from Last 3 Encounters:  05/28/22 250 lb 6.4 oz (113.6 kg)  05/18/22 248 lb 9.6 oz (112.8 kg)  08/12/21 252 lb (114.3 kg)       Assessment & Plan:  Urinary frequency Assessment & Plan: With elevated wbc-----  keflex started  Culture pending   Orders: -     POCT Urinalysis Dipstick (Automated) -     Urine Culture -     Cephalexin; Take 1 capsule (500 mg total) by mouth 2 (two) times daily.  Dispense: 14 capsule; Refill: 0  Allergic  conjunctivitis of both eyes -     Olopatadine HCl; 1 gtt bid  Dispense: 2.5 mL; Refill: 12    I, [name], DO, personally preformed the services described in this documentation.  All medical record entries made by  the scribe were at my direction and in my presence.  I have reviewed the chart and discharge instructions (if applicable) and agree that the record reflects my personal performance and is accurate and complete. 05/28/2022   I,[name],acting as a scribe for [name], DO.,have documented all relevant documentation on the behalf of [name], DO,as directed by  [name], DO while in the presence of [name], DO.   [name], DO

## 2022-05-28 NOTE — Assessment & Plan Note (Signed)
With elevated wbc-----  keflex started  Culture pending

## 2022-05-28 NOTE — Assessment & Plan Note (Signed)
Keflex bid x 7 days  Culture pending  Return to office if symptoms worsen or do not improve

## 2022-05-29 LAB — URINE CULTURE
MICRO NUMBER:: 14754787
Result:: NO GROWTH
SPECIMEN QUALITY:: ADEQUATE

## 2022-06-08 DIAGNOSIS — M9903 Segmental and somatic dysfunction of lumbar region: Secondary | ICD-10-CM | POA: Diagnosis not present

## 2022-06-08 DIAGNOSIS — M5386 Other specified dorsopathies, lumbar region: Secondary | ICD-10-CM | POA: Diagnosis not present

## 2022-06-08 DIAGNOSIS — M9905 Segmental and somatic dysfunction of pelvic region: Secondary | ICD-10-CM | POA: Diagnosis not present

## 2022-06-08 DIAGNOSIS — M9904 Segmental and somatic dysfunction of sacral region: Secondary | ICD-10-CM | POA: Diagnosis not present

## 2022-06-15 ENCOUNTER — Encounter: Payer: Self-pay | Admitting: *Deleted

## 2022-06-17 DIAGNOSIS — M9904 Segmental and somatic dysfunction of sacral region: Secondary | ICD-10-CM | POA: Diagnosis not present

## 2022-06-17 DIAGNOSIS — M9903 Segmental and somatic dysfunction of lumbar region: Secondary | ICD-10-CM | POA: Diagnosis not present

## 2022-06-17 DIAGNOSIS — M9905 Segmental and somatic dysfunction of pelvic region: Secondary | ICD-10-CM | POA: Diagnosis not present

## 2022-06-17 DIAGNOSIS — M5386 Other specified dorsopathies, lumbar region: Secondary | ICD-10-CM | POA: Diagnosis not present

## 2022-06-19 ENCOUNTER — Ambulatory Visit: Payer: Medicaid Other | Admitting: Cardiovascular Disease

## 2022-06-24 IMAGING — US US PELVIS COMPLETE WITH TRANSVAGINAL
2 series · 13 of 25 positions shown · non-contrast
Comparison: None Available.

CLINICAL DATA: Initial evaluation for pelvic pain and hip pain.

EXAM:
ULTRASOUND PELVIS TRANSVAGINAL
TECHNIQUE: Transvaginal ultrasound examination of the pelvis was performed
including evaluation of the uterus, ovaries, adnexal regions, and
pelvic cul-de-sac.

[Series 2: us pelvis complete with transvaginal · 12 of 50 slices shown (1 of 2)]
[im 1/50]
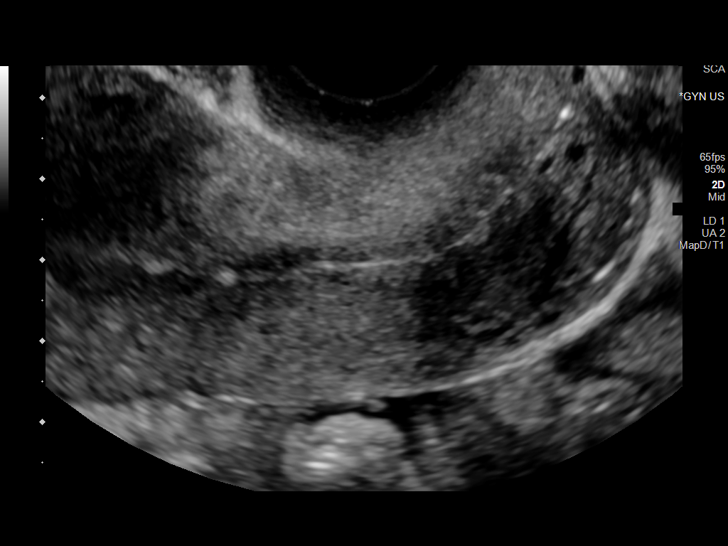
[im 5/50]
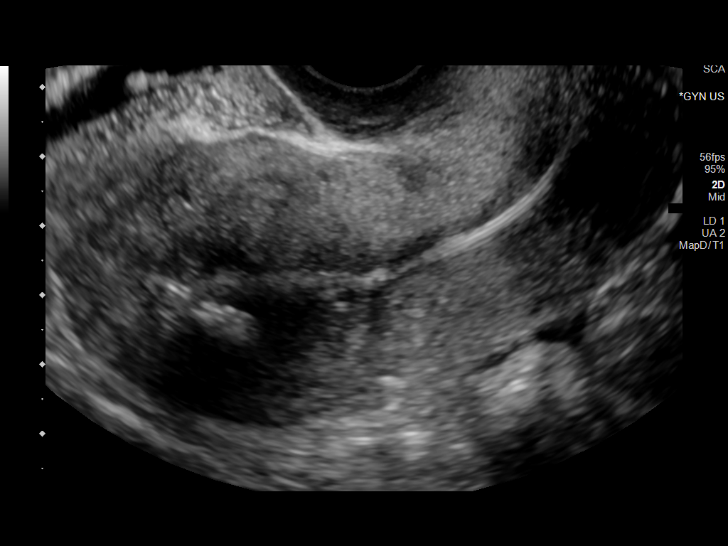
[im 9/50]
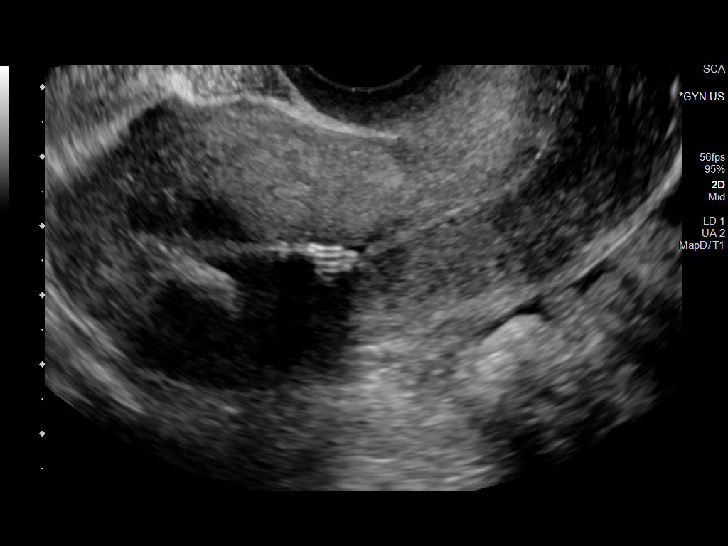
[im 13/50]
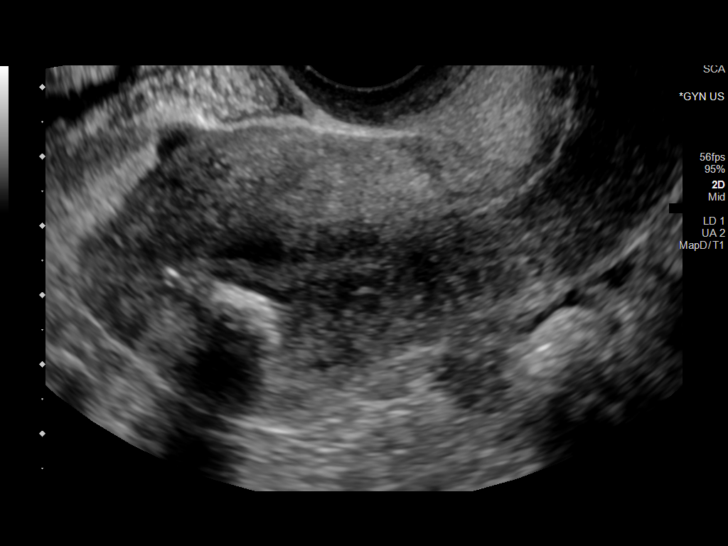
[im 18/50]
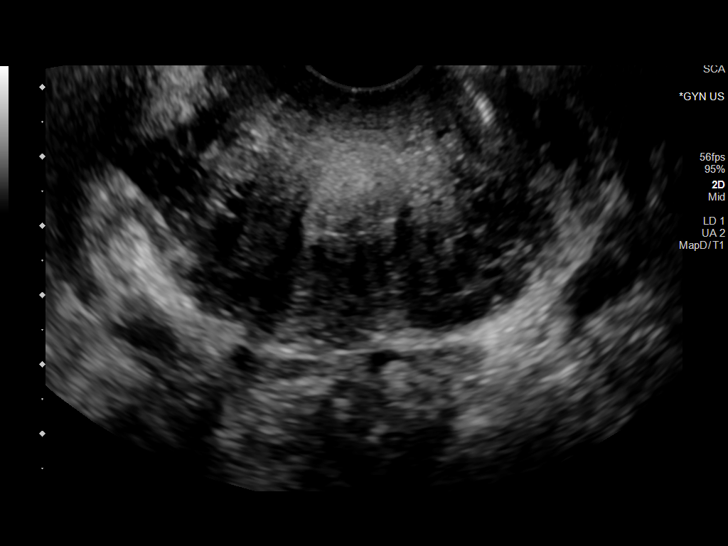
[im 22/50]
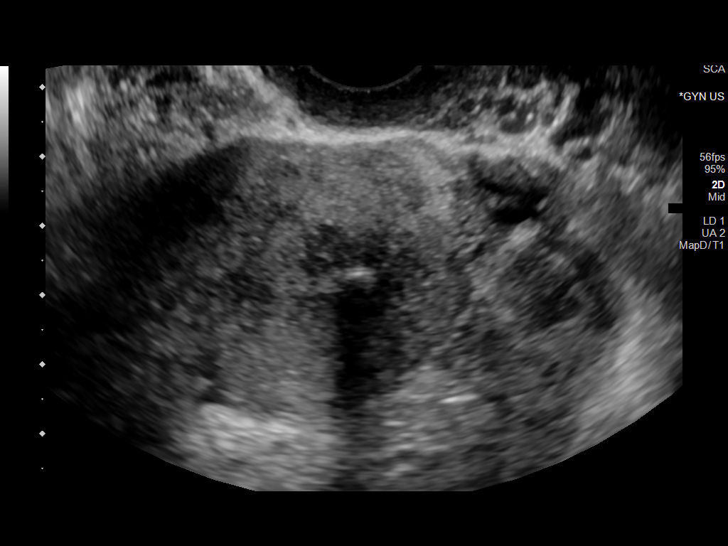
[im 26/50]
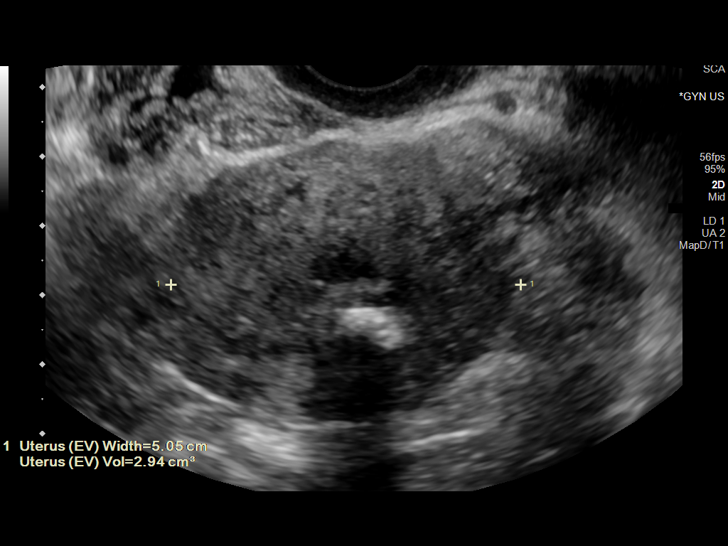
[im 30/50]
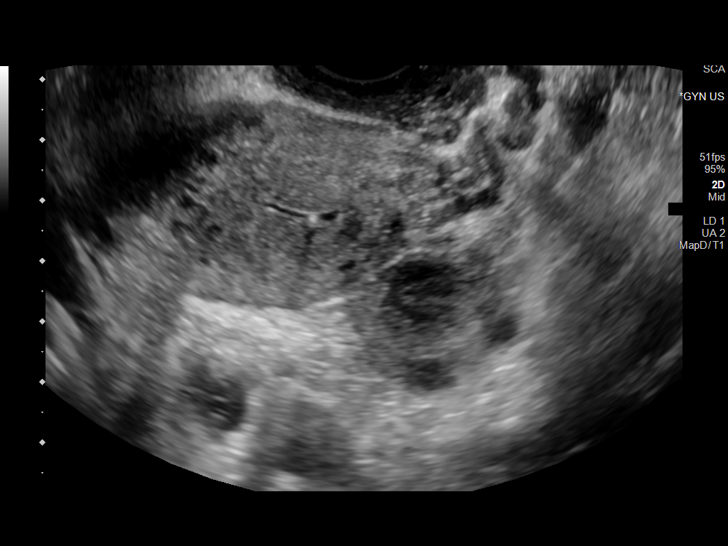
[im 35/50]
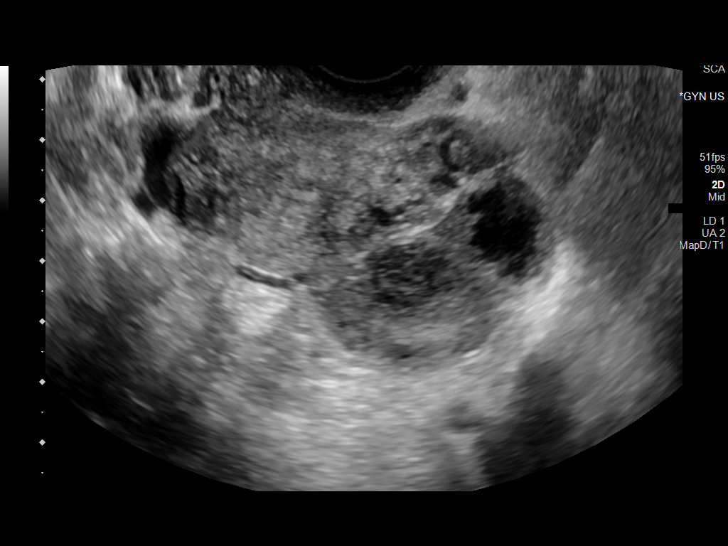
[im 39/50]
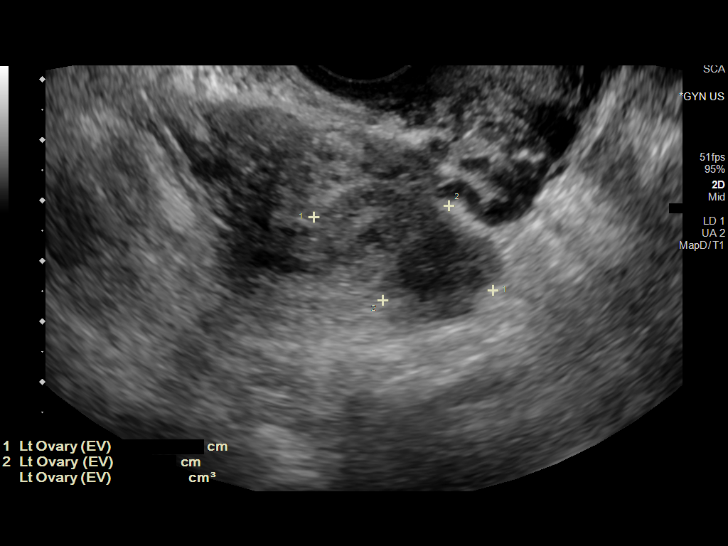
[im 43/50]
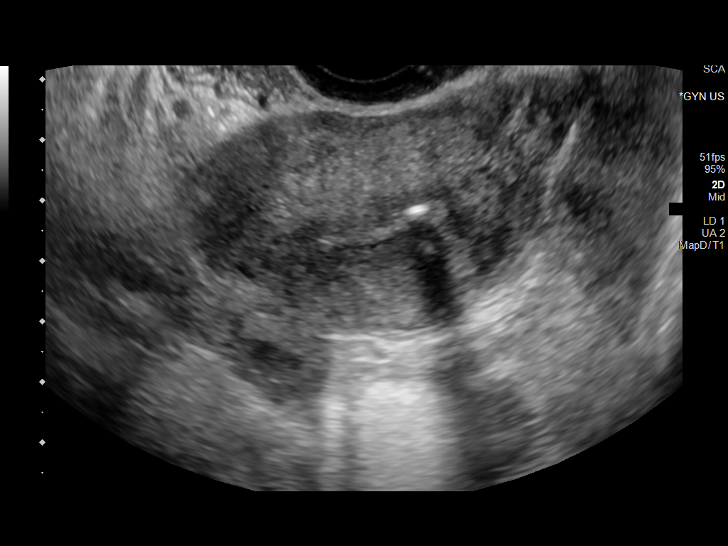
[im 47/50]
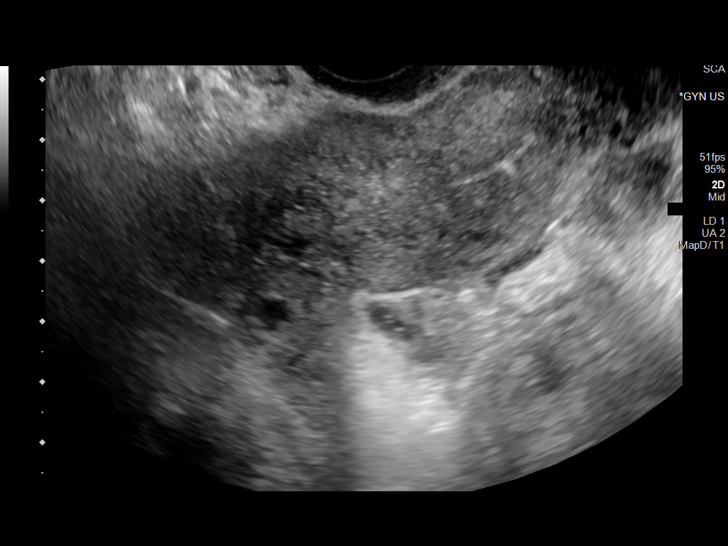

[Series 1001: us pelvis complete with transvaginal · 1 of 1 slices shown (2 of 2)]
[im 1/1]
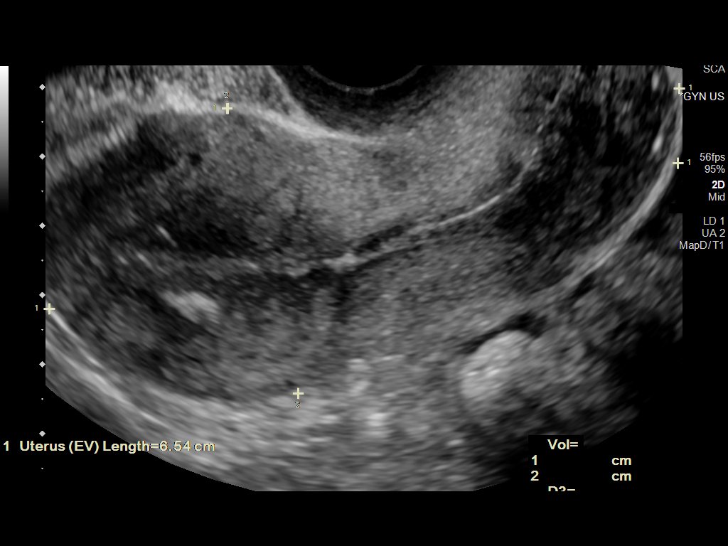

[13 of 25 positions shown; findings below may reference images not displayed]

FINDINGS: Uterus

Measurements: 9.6 x 4.2 x 5.1 cm = volume: 108.8 mL. Uterus is
anteverted. 1.4 x 0.6 x 1.0 cm calcified intramural fibroid present
at the posterior uterine fundus.

Endometrium

Thickness: 3.5 mm. No focal abnormality visualized. IUD in
appropriate position within the upper endometrial cavity.

Right ovary

Measurements: 3.0 x 2.0 x 2.3 cm = volume: 7.3 mL. Normal
appearance/no adnexal mass.

Left ovary

Measurements: 3.2 x 1.9 x 3.9 cm = volume: 12.3 mL. 1.5 cm complex
hypoechoic cystic lesion. No internal vascularity or solid
nodularity.

Other findings

No abnormal free fluid.
IMPRESSION: 1. 1.4 cm calcified intramural fibroid at the posterior uterine
fundus.
2. 1.5 cm complex left ovarian cyst, likely a hemorrhagic cyst,
although endometrioma could also be considered. Short interval
follow-up ultrasound in 6-12 weeks could be performed for further
evaluation as warranted.
3. IUD in appropriate position within the endometrial cavity.

## 2022-06-29 ENCOUNTER — Ambulatory Visit: Payer: Medicaid Other | Admitting: Family Medicine

## 2022-06-29 VITALS — BP 140/100 | HR 76 | Temp 98.4°F | Resp 18 | Ht 65.0 in | Wt 254.6 lb

## 2022-06-29 DIAGNOSIS — M545 Low back pain, unspecified: Secondary | ICD-10-CM | POA: Diagnosis not present

## 2022-06-29 DIAGNOSIS — G8929 Other chronic pain: Secondary | ICD-10-CM | POA: Diagnosis not present

## 2022-06-29 DIAGNOSIS — D72829 Elevated white blood cell count, unspecified: Secondary | ICD-10-CM | POA: Diagnosis not present

## 2022-06-29 LAB — CBC WITH DIFFERENTIAL/PLATELET
Basophils Absolute: 0.1 10*3/uL (ref 0.0–0.1)
Basophils Relative: 0.6 % (ref 0.0–3.0)
Eosinophils Absolute: 0.1 10*3/uL (ref 0.0–0.7)
Eosinophils Relative: 1.3 % (ref 0.0–5.0)
HCT: 42.9 % (ref 36.0–46.0)
Hemoglobin: 14.5 g/dL (ref 12.0–15.0)
Lymphocytes Relative: 34.7 % (ref 12.0–46.0)
Lymphs Abs: 3.4 10*3/uL (ref 0.7–4.0)
MCHC: 33.9 g/dL (ref 30.0–36.0)
MCV: 90.5 fl (ref 78.0–100.0)
Monocytes Absolute: 0.7 10*3/uL (ref 0.1–1.0)
Monocytes Relative: 7.3 % (ref 3.0–12.0)
Neutro Abs: 5.5 10*3/uL (ref 1.4–7.7)
Neutrophils Relative %: 56.1 % (ref 43.0–77.0)
Platelets: 322 10*3/uL (ref 150.0–400.0)
RBC: 4.74 Mil/uL (ref 3.87–5.11)
RDW: 13.4 % (ref 11.5–15.5)
WBC: 9.8 10*3/uL (ref 4.0–10.5)

## 2022-06-29 MED ORDER — CYCLOBENZAPRINE HCL 10 MG PO TABS: 10.0000 mg | ORAL_TABLET | Freq: Three times a day (TID) | ORAL | 0 refills | Status: DC | PRN

## 2022-06-29 MED ORDER — PREDNISONE 10 MG PO TABS: ORAL_TABLET | ORAL | 0 refills | Status: DC

## 2022-06-29 NOTE — Assessment & Plan Note (Signed)
Recheck labs today. 

## 2022-06-29 NOTE — Progress Notes (Signed)
Subjective:   By signing my name below, I, Isabelle Course, attest that this documentation has been prepared under the direction and in the presence of Donato Schultz, DO 06/29/22   Patient ID: Lesle Chris, female    DOB: [birth year], 42 y.o.   MRN: [id number]  Chief Complaint  Patient presents with   Elevated WBC follow up    HPI Patient is in today for a follow up.   She complains of persistent lower left back pain last Wednesday. She states her pain started after lifting a bag of mulch the wrong way. She went to the chiropractor the same day and has been doing stretches. She has not had any xrays. She notes the pain tends to occur when she is getting up after siting or lying down for too long. She was previously taking 10 mg cyclobenzaprine but ran out.   Her WBC was previously elevated. She also states her UTI results were negative.  Lab Results  Component Value Date   WBC 9.8 06/29/2022   She has a history of cysts in her ovaries. She had a pelvic MRI on 9/11 and was advised for a repeat ultrasound in 6-8 months. Results showed cyst in left ovary had increased in size compared to prior imaging.  Past Medical History:  Diagnosis Date   Bilateral leg edema    Chicken pox    Depression    Drug use    Dyspnea    GERD (gastroesophageal reflux disease)    Low back pain     Past Surgical History:  Procedure Laterality Date   WISDOM TOOTH EXTRACTION  08/2019    Family History  Problem Relation Age of Onset   Hypertension Mother    Obesity Mother    Hypertension Father    Depression Father    Sleep apnea Father    Drug abuse Father    Obesity Father    Arthritis Paternal Grandmother    Cancer Paternal Grandmother        breast   Alcohol abuse Sister    Stroke Sister    Heart disease Maternal Grandmother    Alcohol abuse Maternal Grandfather     Social History   Socioeconomic History   Marital status: Single    Spouse name: Not on file   Number of  children: 1   Years of education: Not on file   Highest education level: Bachelor's degree (e.g., BA, AB, BS)  Occupational History   Occupation:  call center  Tobacco Use   Smoking status: Former    Years: 2    Types: Cigarettes   Smokeless tobacco: Never  Vaping Use   Vaping Use: Never used  Substance and Sexual Activity   Alcohol use: Yes    Alcohol/week: 0.0 standard drinks of alcohol    Comment: social   Drug use: Not Currently   Sexual activity: Yes    Birth control/protection: I.U.D.  Other Topics Concern   Not on file  Social History Narrative   Exercise ---  5 days a week for 40 min   Social Determinants of Health   Financial Resource Strain: Patient Declined (06/29/2022)   Overall Financial Resource Strain (CARDIA)    Difficulty of Paying Living Expenses: Patient declined  Food Insecurity: Patient Declined (06/29/2022)   Hunger Vital Sign    Worried About Running Out of Food in the Last Year: Patient declined    Ran Out of Food in the Last Year: Patient declined  Transportation  Needs: Unknown (06/29/2022)   PRAPARE - Transportation    Lack of Transportation (Medical): No    Lack of Transportation (Non-Medical): Patient declined  Physical Activity: Unknown (06/29/2022)   Exercise Vital Sign    Days of Exercise per Week: Patient declined    Minutes of Exercise per Session: Not on file  Stress: No Stress Concern Present (06/29/2022)   [employer] of Occupational Health - Occupational Stress Questionnaire    Feeling of Stress : Only a little  Social Connections: Moderately Integrated (06/29/2022)   Social Connection and Isolation Panel [NHANES]    Frequency of Communication with Friends and Family: More than three times a week    Frequency of Social Gatherings with Friends and Family: Once a week    Attends Religious Services: More than 4 times per year    Active Member of [employer] or Organizations: Yes    Attends Engineer, structural: More than 4 times  per year    Marital Status: Never married  Catering manager Violence: Not on file    Outpatient Medications Prior to Visit  Medication Sig Dispense Refill   Olopatadine HCl 0.2 % SOLN 1 gtt bid 2.5 mL 12   Vitamin D, Ergocalciferol, (DRISDOL) 1.25 MG (50000 UNIT) CAPS capsule Take 1 capsule (50,000 Units total) by mouth every 7 (seven) days. 12 capsule 1   cyclobenzaprine (FLEXERIL) 10 MG tablet Take 1 tablet (10 mg total) by mouth every 8 (eight) hours as needed for muscle spasms. Take one tablet before procedure. 2 tablet 0   levonorgestrel (MIRENA) 20 MCG/DAY IUD 1 each by Intrauterine route once for 1 dose. 1 each 0   cephALEXin (KEFLEX) 500 MG capsule Take 1 capsule (500 mg total) by mouth 2 (two) times daily. (Patient not taking: Reported on 06/29/2022) 14 capsule 0   No facility-administered medications prior to visit.    No Known Allergies  Review of Systems  Constitutional:  Negative for fever and malaise/fatigue.  HENT:  Negative for congestion.   Eyes:  Negative for blurred vision.  Respiratory:  Negative for shortness of breath.   Cardiovascular:  Negative for chest pain, palpitations and leg swelling.  Gastrointestinal:  Negative for abdominal pain, blood in stool and nausea.  Genitourinary:  Negative for dysuria and frequency.  Musculoskeletal:  Positive for back pain (lower left back pain - see HPI). Negative for falls.  Skin:  Negative for rash.  Neurological:  Negative for dizziness, focal weakness, loss of consciousness and headaches.  Endo/Heme/Allergies:  Negative for environmental allergies.  Psychiatric/Behavioral:  Negative for depression. The patient is not nervous/anxious.        Objective:    Physical Exam Vitals and nursing note reviewed.  Constitutional:      Appearance: She is well-developed.  HENT:     Head: Normocephalic and atraumatic.  Eyes:     Conjunctiva/sclera: Conjunctivae normal.  Neck:     Thyroid: No thyromegaly.     Vascular: No  carotid bruit or JVD.  Cardiovascular:     Rate and Rhythm: Normal rate and regular rhythm.     Heart sounds: Normal heart sounds. No murmur heard. Pulmonary:     Effort: Pulmonary effort is normal. No respiratory distress.     Breath sounds: Normal breath sounds. No wheezing or rales.  Chest:     Chest wall: No tenderness.  Musculoskeletal:        General: Tenderness present. No swelling. Normal range of motion.     Cervical back: Normal  range of motion and neck supple.     Right lower leg: No edema.     Left lower leg: No edema.  Neurological:     General: No focal deficit present.     Mental Status: She is alert and oriented to person, place, and time.     Sensory: No sensory deficit.     Coordination: Coordination normal.     Gait: Gait normal.     Deep Tendon Reflexes: Reflexes normal.     BP (!) 140/100 (BP Location: Left Arm, Patient Position: Sitting, Cuff Size: Large)   Pulse 76   Temp 98.4 F (36.9 C) (Oral)   Resp 18   Ht 5\' 5"  (1.651 m)   Wt 254 lb 9.6 oz (115.5 kg)   SpO2 98%   BMI 42.37 kg/m  Wt Readings from Last 3 Encounters:  06/29/22 254 lb 9.6 oz (115.5 kg)  05/28/22 250 lb 6.4 oz (113.6 kg)  05/18/22 248 lb 9.6 oz (112.8 kg)       Assessment & Plan:  Leukocytosis, unspecified type Assessment & Plan: Recheck labs today  Orders: -     CBC with Differential/Platelet  Chronic midline low back pain without sciatica Assessment & Plan: Cyclobenzaprine and pred taper  Consider PT   Orders: -     Cyclobenzaprine HCl; Take 1 tablet (10 mg total) by mouth every 8 (eight) hours as needed for muscle spasms. Take one tablet before procedure.  Dispense: 2 tablet; Refill: 0 -     predniSONE; TAKE 3 TABLETS PO QD FOR 3 DAYS THEN TAKE 2 TABLETS PO QD FOR 3 DAYS THEN TAKE 1 TABLET PO QD FOR 3 DAYS THEN TAKE 1/2 TAB PO QD FOR 3 DAYS  Dispense: 20 tablet; Refill: 0     I,Rachel Rivera,acting as a scribe for Donato Schultz, DO.,have documented all  relevant documentation on the behalf of Donato Schultz, DO,as directed by  Donato Schultz, DO while in the presence of Donato Schultz, DO.   I, Donato Schultz, DO, personally preformed the services described in this documentation.  All medical record entries made by the scribe were at my direction and in my presence.  I have reviewed the chart and discharge instructions (if applicable) and agree that the record reflects my personal performance and is accurate and complete. 06/29/22   Donato Schultz, DO

## 2022-06-29 NOTE — Assessment & Plan Note (Signed)
Cyclobenzaprine and pred taper  Consider PT

## 2022-06-30 DIAGNOSIS — F99 Mental disorder, not otherwise specified: Secondary | ICD-10-CM | POA: Diagnosis not present

## 2022-07-08 DIAGNOSIS — F4329 Adjustment disorder with other symptoms: Secondary | ICD-10-CM | POA: Diagnosis not present

## 2022-07-13 ENCOUNTER — Ambulatory Visit: Payer: Medicaid Other | Admitting: Nurse Practitioner

## 2022-07-13 ENCOUNTER — Encounter: Payer: Self-pay | Admitting: Nurse Practitioner

## 2022-07-13 VITALS — BP 130/84 | HR 99 | Temp 97.9°F | Ht 65.0 in | Wt 251.0 lb

## 2022-07-13 DIAGNOSIS — J029 Acute pharyngitis, unspecified: Secondary | ICD-10-CM | POA: Diagnosis not present

## 2022-07-13 LAB — POCT RAPID STREP A (OFFICE): Rapid Strep A Screen: NEGATIVE

## 2022-07-13 MED ORDER — AMOXICILLIN 500 MG PO CAPS: 500.0000 mg | ORAL_CAPSULE | Freq: Two times a day (BID) | ORAL | 0 refills | Status: AC

## 2022-07-13 NOTE — Patient Instructions (Signed)
It was great to see you!  Keep taking the claritin daily, gargling with warm salt water and lozenges.   Start amoxicillin twice a day for 10 days.   Drink plenty of water.   Let's follow-up if your symptoms worsen or don't improve.   Take care,  Rodman Pickle, NP

## 2022-07-13 NOTE — Progress Notes (Signed)
Acute Office Visit  Subjective:     Patient ID: Jody Bryan, female    DOB: 1980/12/24, 42 y.o.   MRN: [id number]  Chief Complaint  Patient presents with   Sore Throat    Started on Saturday-exposed by Strep    HPI Patient is in today for sore throat for the last 4 days.   UPPER RESPIRATORY TRACT INFECTION  Fever: no Cough: no Shortness of breath: no Wheezing: no Chest pain: no Chest tightness: no Chest congestion: no Nasal congestion: no Runny nose: yes Post nasal drip: yes Sneezing: no Sore throat: yes Swollen glands: no Sinus pressure: no Headache: yes Face pain: no Toothache: no Ear pain: no bilateral Ear pressure: no bilateral Eyes red/itching:no Eye drainage/crusting: yes - watery Vomiting: no Rash: no Fatigue: yes Sick contacts: yes Strep contacts: yes - son Context: worse Recurrent sinusitis: no Relief with OTC cold/cough medications:  a little   Treatments attempted: hall lozenge, gargle warm salt water   ROS See pertinent positives and negatives per HPI.     Objective:    BP 130/84 (BP Location: Left Arm, Cuff Size: Large)   Pulse 99   Temp 97.9 F (36.6 C)   Ht 5\' 5"  (1.651 m)   Wt 251 lb (113.9 kg)   SpO2 97%   BMI 41.77 kg/m    Physical Exam Vitals and nursing note reviewed.  Constitutional:      General: She is not in acute distress.    Appearance: Normal appearance.  HENT:     Head: Normocephalic.     Right Ear: Tympanic membrane, ear canal and external ear normal.     Left Ear: Tympanic membrane, ear canal and external ear normal.     Mouth/Throat:     Pharynx: Posterior oropharyngeal erythema present. No oropharyngeal exudate.  Eyes:     Conjunctiva/sclera: Conjunctivae normal.  Cardiovascular:     Rate and Rhythm: Normal rate and regular rhythm.     Pulses: Normal pulses.     Heart sounds: Normal heart sounds.  Pulmonary:     Effort: Pulmonary effort is normal.     Breath sounds: Normal breath sounds.   Musculoskeletal:     Cervical back: Normal range of motion.  Skin:    General: Skin is warm.  Neurological:     General: No focal deficit present.     Mental Status: She is alert and oriented to person, place, and time.  Psychiatric:        Mood and Affect: Mood normal.        Behavior: Behavior normal.        Thought Content: Thought content normal.        Judgment: Judgment normal.     Results for orders placed or performed in visit on 07/13/22  POCT rapid strep A  Result Value Ref Range   Rapid Strep A Screen Negative Negative        Assessment & Plan:   Problem List Items Addressed This Visit   None Visit Diagnoses     Sore throat    -  Primary   POC strep negative, however with close contact and erythematous throat, will treat with amoxicillin 500mg  BID x10 days. Cont. OTC meds. F/U if not improving.   Relevant Orders   POCT rapid strep A (Completed)       Meds ordered this encounter  Medications   amoxicillin (AMOXIL) 500 MG capsule    Sig: Take 1 capsule (500 mg total) by  mouth 2 (two) times daily for 10 days.    Dispense:  20 capsule    Refill:  0    Return if symptoms worsen or fail to improve.  Gerre Scull, NP

## 2022-07-15 DIAGNOSIS — Z630 Problems in relationship with spouse or partner: Secondary | ICD-10-CM | POA: Diagnosis not present

## 2022-07-15 DIAGNOSIS — F4323 Adjustment disorder with mixed anxiety and depressed mood: Secondary | ICD-10-CM | POA: Diagnosis not present

## 2022-07-22 DIAGNOSIS — F4323 Adjustment disorder with mixed anxiety and depressed mood: Secondary | ICD-10-CM | POA: Diagnosis not present

## 2022-08-12 DIAGNOSIS — Z630 Problems in relationship with spouse or partner: Secondary | ICD-10-CM | POA: Diagnosis not present

## 2022-08-12 DIAGNOSIS — F4323 Adjustment disorder with mixed anxiety and depressed mood: Secondary | ICD-10-CM | POA: Diagnosis not present

## 2022-08-20 DIAGNOSIS — F4323 Adjustment disorder with mixed anxiety and depressed mood: Secondary | ICD-10-CM | POA: Diagnosis not present

## 2022-08-20 DIAGNOSIS — R4581 Low self-esteem: Secondary | ICD-10-CM | POA: Diagnosis not present

## 2022-08-26 DIAGNOSIS — F4323 Adjustment disorder with mixed anxiety and depressed mood: Secondary | ICD-10-CM | POA: Diagnosis not present

## 2022-09-08 DIAGNOSIS — F4323 Adjustment disorder with mixed anxiety and depressed mood: Secondary | ICD-10-CM | POA: Diagnosis not present

## 2022-09-16 DIAGNOSIS — M9903 Segmental and somatic dysfunction of lumbar region: Secondary | ICD-10-CM | POA: Diagnosis not present

## 2022-09-16 DIAGNOSIS — Z0289 Encounter for other administrative examinations: Secondary | ICD-10-CM

## 2022-09-16 DIAGNOSIS — M9905 Segmental and somatic dysfunction of pelvic region: Secondary | ICD-10-CM | POA: Diagnosis not present

## 2022-09-16 DIAGNOSIS — M9904 Segmental and somatic dysfunction of sacral region: Secondary | ICD-10-CM | POA: Diagnosis not present

## 2022-09-16 DIAGNOSIS — M5386 Other specified dorsopathies, lumbar region: Secondary | ICD-10-CM | POA: Diagnosis not present

## 2022-09-18 DIAGNOSIS — F4323 Adjustment disorder with mixed anxiety and depressed mood: Secondary | ICD-10-CM | POA: Diagnosis not present

## 2022-09-21 DIAGNOSIS — M9904 Segmental and somatic dysfunction of sacral region: Secondary | ICD-10-CM | POA: Diagnosis not present

## 2022-09-21 DIAGNOSIS — M9905 Segmental and somatic dysfunction of pelvic region: Secondary | ICD-10-CM | POA: Diagnosis not present

## 2022-09-21 DIAGNOSIS — M9903 Segmental and somatic dysfunction of lumbar region: Secondary | ICD-10-CM | POA: Diagnosis not present

## 2022-09-21 DIAGNOSIS — M5386 Other specified dorsopathies, lumbar region: Secondary | ICD-10-CM | POA: Diagnosis not present

## 2022-10-14 ENCOUNTER — Ambulatory Visit (INDEPENDENT_AMBULATORY_CARE_PROVIDER_SITE_OTHER): Payer: Medicaid Other | Admitting: Internal Medicine

## 2022-10-28 ENCOUNTER — Ambulatory Visit (INDEPENDENT_AMBULATORY_CARE_PROVIDER_SITE_OTHER): Payer: Medicaid Other | Admitting: Internal Medicine

## 2022-10-28 ENCOUNTER — Encounter (INDEPENDENT_AMBULATORY_CARE_PROVIDER_SITE_OTHER): Payer: Self-pay | Admitting: Internal Medicine

## 2022-10-28 VITALS — BP 136/79 | HR 81 | Temp 98.1°F | Ht 65.0 in | Wt 252.0 lb

## 2022-10-28 DIAGNOSIS — Z8632 Personal history of gestational diabetes: Secondary | ICD-10-CM | POA: Diagnosis not present

## 2022-10-28 DIAGNOSIS — E559 Vitamin D deficiency, unspecified: Secondary | ICD-10-CM | POA: Diagnosis not present

## 2022-10-28 DIAGNOSIS — R0602 Shortness of breath: Secondary | ICD-10-CM

## 2022-10-28 DIAGNOSIS — Z6841 Body Mass Index (BMI) 40.0 and over, adult: Secondary | ICD-10-CM | POA: Diagnosis not present

## 2022-10-28 DIAGNOSIS — R948 Abnormal results of function studies of other organs and systems: Secondary | ICD-10-CM | POA: Diagnosis not present

## 2022-10-28 DIAGNOSIS — R7303 Prediabetes: Secondary | ICD-10-CM | POA: Diagnosis not present

## 2022-10-28 DIAGNOSIS — R29818 Other symptoms and signs involving the nervous system: Secondary | ICD-10-CM | POA: Insufficient documentation

## 2022-10-28 DIAGNOSIS — Z1331 Encounter for screening for depression: Secondary | ICD-10-CM

## 2022-10-28 DIAGNOSIS — R5383 Other fatigue: Secondary | ICD-10-CM | POA: Diagnosis not present

## 2022-10-28 DIAGNOSIS — E538 Deficiency of other specified B group vitamins: Secondary | ICD-10-CM

## 2022-10-28 NOTE — Assessment & Plan Note (Signed)
Patient made aware of risk of developing diabetes.  Losing 10 to 15% of body weight may reduce the risk.

## 2022-10-28 NOTE — Assessment & Plan Note (Signed)
She has low normal B12 levels.  We will check methylmalonic acid.

## 2022-10-28 NOTE — Assessment & Plan Note (Signed)
Patient has an elevated Epworth and high risk phenotype.  We will screen further for sleep disordered breathing at the next office visit.

## 2022-10-28 NOTE — Assessment & Plan Note (Signed)
Patient has a slower than predicted metabolism. IC 1512 vs. calculated 1948. This may contribute to weight gain, chronic fatigue and difficulty losing weight.  We reviewed measures to improve metabolism including not skipping meals, progressive strengthening exercises, increasing protein intake at every meal and maintaining adequate hydration and sleep.

## 2022-10-28 NOTE — Assessment & Plan Note (Signed)
She has a history of problems with her weight since she was young, has strong orixegenic signaling and has a BMI above 41.  This is suspicious for genetic obesity.  This also a strong family history.  She has an abnormal metabolism.  She will work on implementing reduced calorie nutrition plan low on carbs and high in protein.  She may also be a candidate for incretin therapy.

## 2022-10-28 NOTE — Assessment & Plan Note (Signed)
Most recent vitamin D levels  Lab Results  Component Value Date   VD25OH 17.44 (L) 05/18/2022   VD25OH 24.61 (L) 08/12/2021   VD25OH 40.4 06/24/2017     Deficiency state associated with adiposity and may result in leptin resistance, weight gain and fatigue. Currently on vitamin D supplementation without any adverse effects.  Plan: Check vitamin D levels

## 2022-10-28 NOTE — Progress Notes (Signed)
Chief Complaint:   OBESITY Jody Bryan (MR# [id number]) is a 42 y.o. female who presents for evaluation and treatment of obesity and related comorbidities. Current BMI is Body mass index is 41.93 kg/m. Jody Bryan has been struggling with her weight for many years and has been unsuccessful in either losing weight, maintaining weight loss, or reaching her healthy weight goal.  Jody Bryan is currently in the action stage of change and ready to dedicate time achieving and maintaining a healthier weight. Jody Bryan is interested in becoming our patient and working on intensive lifestyle modifications including (but not limited to) diet and exercise for weight loss.  Jody Bryan's habits were reviewed today and are as follows: Her family eats meals together, she struggles with family and or coworkers weight loss sabotage, her desired weight loss is 62 lbs, she has been heavy most of her life, she started gaining weight after giving birth, her heaviest weight ever was 270 pounds, she has significant food cravings issues, she snacks frequently in the evenings, she skips meals frequently, she is frequently drinking liquids with calories, she frequently makes poor food choices, she frequently eats larger portions than normal, and she struggles with emotional eating.  Depression Screen Jody Bryan's Food and Mood (modified PHQ-9) score was 11.  Subjective:   1. Other fatigue Jody Bryan admits to daytime somnolence and admits to waking up still tired. Patient has a history of symptoms of daytime fatigue and morning fatigue. Jody Bryan generally gets 4 or 6 hours of sleep per night, and states that she has nightime awakenings. Snoring is present. Apneic episodes are not present. Epworth Sleepiness Score is 8.   2. SOB (shortness of breath) on exertion Jody Bryan notes increasing shortness of breath with exercising and seems to be worsening over time with weight gain. She notes getting out of breath sooner with  activity than she used to. This has not gotten worse recently. Jody Bryan denies shortness of breath at rest or orthopnea.  3. Vitamin D deficiency Deficiency state associated with adiposity and may result in leptin resistance, weight gain and fatigue. Currently on vitamin D supplementation without any adverse effects.  Most recent vitamin D levels  Lab Results  Component Value Date   VD25OH 17.44 (L) 05/18/2022   VD25OH 24.61 (L) 08/12/2021   VD25OH 40.4 06/24/2017    4. Vitamin B12 deficiency She has low normal B12 levels.  5. Prediabetes Patient aware of disease state and risk of progression. This may contribute to abnormal cravings, fatigue and diabetic complications without having diabetes.   Most recent A1c is  Lab Results  Component Value Date   HGBA1C 5.5 01/02/2021   HGBA1C 5.7 06/11/2015   6. Hx gestational diabetes Patient made aware of risk of developing diabetes.  7. Suspected sleep apnea Patient has an elevated Epworth and high risk phenotype.  8. Abnormal metabolism Patient has a slower than predicted metabolism. IC 1512 vs. calculated 1948. This may contribute to weight gain, chronic fatigue and difficulty losing weight.   Assessment/Plan:   1. Other fatigue Jody Bryan does feel that her weight is causing her energy to be lower than it should be. Fatigue may be related to obesity, depression or many other causes. Labs will be ordered, and in the meanwhile, Jody Bryan will focus on self care including making healthy food choices, increasing physical activity and focusing on stress reduction.  - EKG 12-Lead  2. SOB (shortness of breath) on exertion Jody Bryan does feel that she gets out of breath more easily that she  used to when she exercises. Jody Bryan's shortness of breath appears to be obesity related and exercise induced. She has agreed to work on weight loss and gradually increase exercise to treat her exercise induced shortness of breath. Will continue to monitor  closely.  3. Vitamin D deficiency Check vitamin D levels.  4. Vitamin B12 deficiency We will check methylmalonic acid.  - Methylmalonic acid, serum  5. Prediabetes We have discussed treatment options which include: losing 7 to 10% of body weight, increasing physical activity to a goal of 150 minutes a week at moderate intensity.  Advised to maintain a diet low on simple and processed carbohydrates.  We will check fasting blood glucose, hemoglobin A1c and insulin levels.  She may also be a candidate for pharmacoprophylaxis with metformin or incretin mimetic.   - Comprehensive metabolic panel - Hemoglobin A1c - Insulin, random  6. Hx gestational diabetes Losing 10 to 15% of body weight may reduce the risk.  7. Suspected sleep apnea We will screen further for sleep disordered breathing at the next office visit.  8. Abnormal metabolism We reviewed measures to improve metabolism including not skipping meals, progressive strengthening exercises, increasing protein intake at every meal and maintaining adequate hydration and sleep.   9. Depression screen Jody Bryan had a positive depression screening. Depression is commonly associated with obesity and often results in emotional eating behaviors. We will monitor this closely and work on CBT to help improve the non-hunger eating patterns. Referral to Psychology may be required if no improvement is seen as she continues in our clinic.  10. Class 3 severe obesity with serious comorbidity and body mass index (BMI) of 40.0 to 44.9 in adult, unspecified obesity type Jody Bryan) She has a history of problems with her weight since she was young, has strong orixegenic signaling and has a BMI above 41.  This is suspicious for genetic obesity.  This also a strong family history.  She has an abnormal metabolism.  She will work on implementing reduced calorie nutrition plan low on carbs and high in protein.  She may also be a candidate for incretin  therapy.  Jody Bryan is currently in the action stage of change and her goal is to continue with weight loss efforts. I recommend Jody Bryan begin the structured treatment plan as follows:  She has agreed to the Category 2 Plan.  Exercise goals: All adults should avoid inactivity. Some physical activity is better than none, and adults who participate in any amount of physical activity gain some health benefits.   Behavioral modification strategies: increasing lean protein intake, decreasing simple carbohydrates, increasing vegetables, increasing water intake, decreasing liquid calories, increasing high fiber foods, no skipping meals, meal planning and cooking strategies, better snacking choices, avoiding temptations, and planning for success.  She was informed of the importance of frequent follow-up visits to maximize her success with intensive lifestyle modifications for her multiple health conditions. She was informed we would discuss her lab results at her next visit unless there is a critical issue that needs to be addressed sooner. Jody Bryan agreed to keep her next visit at the agreed upon time to discuss these results.  Objective:   Blood pressure 136/79, pulse 81, temperature 98.1 F (36.7 C), height 5\' 5"  (1.651 m), weight 252 lb (114.3 kg), last menstrual period 08/28/2020, SpO2 98%, currently breastfeeding. Body mass index is 41.93 kg/m.  EKG: Normal sinus rhythm, rate 78 BPM.  Indirect Calorimeter completed today shows a VO2 of 220 and a REE of 1512.  Her  calculated basal metabolic rate is 8295 thus her basal metabolic rate is worse than expected.  General: Cooperative, alert, well developed, in no acute distress. HEENT: Conjunctivae and lids unremarkable. Cardiovascular: Regular rhythm.  Lungs: Normal work of breathing. Neurologic: No focal deficits.   Lab Results  Component Value Date   CREATININE 0.79 05/18/2022   BUN 19 05/18/2022   NA 140 05/18/2022   K 4.1 05/18/2022    CL 103 05/18/2022   CO2 25 05/18/2022   Lab Results  Component Value Date   ALT 14 05/18/2022   AST 14 05/18/2022   ALKPHOS 73 05/18/2022   BILITOT 0.7 05/18/2022   Lab Results  Component Value Date   HGBA1C 5.5 01/02/2021   HGBA1C 5.4 02/22/2020   HGBA1C 5.4 06/24/2017   HGBA1C 5.6 03/17/2017   HGBA1C 5.7 06/11/2015   Lab Results  Component Value Date   INSULIN 13.3 06/24/2017   INSULIN 25.7 (H) 03/17/2017   Lab Results  Component Value Date   TSH 1.38 05/18/2022   Lab Results  Component Value Date   CHOL 173 05/18/2022   HDL 61.70 05/18/2022   LDLCALC 86 05/18/2022   TRIG 127.0 05/18/2022   CHOLHDL 3 05/18/2022   Lab Results  Component Value Date   WBC 9.8 06/29/2022   HGB 14.5 06/29/2022   HCT 42.9 06/29/2022   MCV 90.5 06/29/2022   PLT 322.0 06/29/2022   No results found for: "IRON", "TIBC", "FERRITIN"  Attestation Statements:   Reviewed by clinician on day of visit: allergies, medications, problem list, medical history, surgical history, family history, social history, and previous encounter notes.  I have spent 60 minutes in the care of the patient today including: preparing to see patient (e.g. review and interpretation of tests, old notes ), obtaining and/or reviewing separately obtained history, performing a medically appropriate examination or evaluation, counseling and educating the patient, ordering medications, test or procedures, documenting clinical information in the electronic or other health care record, and independently interpreting results and communicating results to the patient, family, or caregiver.  Trude Mcburney, am acting as transcriptionist for Worthy Rancher, MD.  I have reviewed the above documentation for accuracy and completeness, and I agree with the above. -Worthy Rancher, MD

## 2022-10-28 NOTE — Assessment & Plan Note (Signed)
Most recent A1c is  Lab Results  Component Value Date   HGBA1C 5.5 01/02/2021   HGBA1C 5.7 06/11/2015    Patient aware of disease state and risk of progression. This may contribute to abnormal cravings, fatigue and diabetic complications without having diabetes.   We have discussed treatment options which include: losing 7 to 10% of body weight, increasing physical activity to a goal of 150 minutes a week at moderate intensity.  Advised to maintain a diet low on simple and processed carbohydrates.  We will check fasting blood glucose, hemoglobin A1c and insulin levels.  She may also be a candidate for pharmacoprophylaxis with metformin or incretin mimetic.

## 2022-11-01 LAB — COMPREHENSIVE METABOLIC PANEL
ALT: 19 IU/L (ref 0–32)
AST: 17 IU/L (ref 0–40)
Albumin: 4.4 g/dL (ref 3.9–4.9)
Alkaline Phosphatase: 79 IU/L (ref 44–121)
BUN/Creatinine Ratio: 16 (ref 9–23)
BUN: 13 mg/dL (ref 6–24)
Bilirubin Total: 0.8 mg/dL (ref 0.0–1.2)
CO2: 22 mmol/L (ref 20–29)
Calcium: 9.1 mg/dL (ref 8.7–10.2)
Chloride: 104 mmol/L (ref 96–106)
Creatinine, Ser: 0.79 mg/dL (ref 0.57–1.00)
Globulin, Total: 2.6 g/dL (ref 1.5–4.5)
Glucose: 84 mg/dL (ref 70–99)
Potassium: 4.6 mmol/L (ref 3.5–5.2)
Sodium: 142 mmol/L (ref 134–144)
Total Protein: 7 g/dL (ref 6.0–8.5)
eGFR: 95 mL/min/{1.73_m2} (ref >59)

## 2022-11-01 LAB — INSULIN, RANDOM: INSULIN: 11.6 u[IU]/mL (ref 2.6–24.9)

## 2022-11-01 LAB — HEMOGLOBIN A1C
Est. average glucose Bld gHb Est-mCnc: 117 mg/dL
Hgb A1c MFr Bld: 5.7 % — ABNORMAL HIGH (ref 4.8–5.6)

## 2022-11-01 LAB — METHYLMALONIC ACID, SERUM: Methylmalonic Acid: 98 nmol/L (ref 0–378)

## 2022-11-04 DIAGNOSIS — Z630 Problems in relationship with spouse or partner: Secondary | ICD-10-CM | POA: Diagnosis not present

## 2022-11-04 DIAGNOSIS — F4323 Adjustment disorder with mixed anxiety and depressed mood: Secondary | ICD-10-CM | POA: Diagnosis not present

## 2022-11-09 ENCOUNTER — Other Ambulatory Visit: Payer: Self-pay | Admitting: Family Medicine

## 2022-11-09 DIAGNOSIS — Z1231 Encounter for screening mammogram for malignant neoplasm of breast: Secondary | ICD-10-CM

## 2022-11-10 ENCOUNTER — Ambulatory Visit: Payer: Medicaid Other | Admitting: Family Medicine

## 2022-11-11 ENCOUNTER — Encounter (INDEPENDENT_AMBULATORY_CARE_PROVIDER_SITE_OTHER): Payer: Self-pay | Admitting: Internal Medicine

## 2022-11-11 ENCOUNTER — Telehealth: Payer: Self-pay | Admitting: Family Medicine

## 2022-11-11 ENCOUNTER — Ambulatory Visit (INDEPENDENT_AMBULATORY_CARE_PROVIDER_SITE_OTHER): Payer: Medicaid Other | Admitting: Internal Medicine

## 2022-11-11 ENCOUNTER — Telehealth: Payer: Self-pay

## 2022-11-11 VITALS — BP 124/84 | HR 68 | Temp 98.3°F | Ht 65.0 in | Wt 247.0 lb

## 2022-11-11 DIAGNOSIS — R948 Abnormal results of function studies of other organs and systems: Secondary | ICD-10-CM

## 2022-11-11 DIAGNOSIS — E538 Deficiency of other specified B group vitamins: Secondary | ICD-10-CM | POA: Diagnosis not present

## 2022-11-11 DIAGNOSIS — R29818 Other symptoms and signs involving the nervous system: Secondary | ICD-10-CM | POA: Diagnosis not present

## 2022-11-11 DIAGNOSIS — R7303 Prediabetes: Secondary | ICD-10-CM

## 2022-11-11 DIAGNOSIS — Z6841 Body Mass Index (BMI) 40.0 and over, adult: Secondary | ICD-10-CM

## 2022-11-11 NOTE — Telephone Encounter (Signed)
Appt scheduled tomorrow w/ Lillia Abed

## 2022-11-11 NOTE — Assessment & Plan Note (Signed)
Methylmalonic acid levels are normal so she does not have B12 deficiency.  Continue to monitor

## 2022-11-11 NOTE — Assessment & Plan Note (Signed)
She has an Epworth of 8.  She reports snoring, feeling tired and has nighttime awakenings.  Her BMI is above 35 and she has a mild elevation in blood pressure.  Her neck size is 15.5 inches and she has a Mallampati of 3-4.  Considering symptomatology and slow metabolism I think she would benefit from polysomnography.  She will discuss this with her PCP at her next follow-up.

## 2022-11-11 NOTE — Assessment & Plan Note (Signed)
  Most recent A1c is  Lab Results  Component Value Date   HGBA1C 5.7 (H) 10/28/2022   HGBA1C 5.7 06/11/2015   She also has a mild degree of insulin resistance with a Homa IR score of 2.4 optimal less than 1.8.  This may contribute to weight gain.  Patient aware of disease state and risk of progression. This may contribute to abnormal cravings, fatigue and diabetic complications without having diabetes.   We have discussed treatment options which include: losing 7 to 10% of body weight, increasing physical activity to a goal of 150 minutes a week at moderate intensity.  Advised to maintain a diet low on simple and processed carbohydrates.  She may also be a candidate for pharmacoprophylaxis with metformin or incretin mimetic.

## 2022-11-11 NOTE — Telephone Encounter (Signed)
Triaging Nurse Salomon Mast reported Jody Bryan was having lower  left back pain and urinary frequency. Pt was offered a visit today with Lillia Abed, [place] and she refused said she couldn't come to in because of pain. Pt was scheduled for tomorrow with Lillia Abed, [place].  Appointment was scheduled  Tomorrow at 1.

## 2022-11-11 NOTE — Assessment & Plan Note (Signed)
Patient has lost 5 pounds BIA suggest a reduction in body fat percentage but also increase in muscle mass so she actually lost 8 pounds of fat.  She appears very motivated and we discussed goals for physical activity.  She will continue with implementation of reduced calorie nutrition plan.  She may also be a candidate for incretin therapy.  Patient also needs to be evaluated for disordered sleep breathing as this may affect her weight.

## 2022-11-11 NOTE — Progress Notes (Signed)
Office: [phone]  /  Fax: [phone]  WEIGHT SUMMARY AND BIOMETRICS  Vitals Temp: 98.3 F (36.8 C) BP: 124/84 Pulse Rate: 68 SpO2: 98 %   Anthropometric Measurements Height: 5\' 5"  (1.651 m) Weight: 247 lb (112 kg) BMI (Calculated): 41.1 Weight at Last Visit: 252 lb Weight Lost Since Last Visit: 5 lb Weight Gained Since Last Visit: 0 lb Starting Weight: 252 lb Peak Weight: 270 lb   Body Composition  Body Fat %: 43.1 % Fat Mass (lbs): 106.6 lbs Muscle Mass (lbs): 133.8 lbs Total Body Water (lbs): 89.2 lbs Visceral Fat Rating : 12    RMR: 1512  Today's Visit #: 2  Starting Date: 10/28/22   HPI  Chief Complaint: OBESITY  Jody Bryan is here to discuss her progress with her obesity treatment plan. She is not following a plan.  She states she is not exercising.  Interval History:  Since last office visit she has lost 5 pounds. She reports working on implementation of reduced calorie nutritional plan She has been working on not skipping meals, increasing protein intake at every meal, eating more vegetables, avoiding and / or reducing liquid calories, avoiding or reducing simple and processed carbohydrates, making healthier choices, working on gradual implementation of reduced calorie nutrition plan, and thinking of starting to exercise  Orexigenic Control: Denies problems with appetite and hunger signals.  Denies problems with satiety and satiation.  Denies problems with eating patterns and portion control.  Denies abnormal cravings. Denies feeling deprived or restricted.   Barriers identified: lack of time for self-care, multiple competing priorities, and moderate to high levels of stress.   Pharmacotherapy for weight loss: She is currently taking no anti-obesity medication.    ASSESSMENT AND PLAN  TREATMENT PLAN FOR OBESITY:  Recommended Dietary Goals  Jody Bryan is currently in the action stage of change. As such, her goal is to continue weight  management plan. She has agreed to: continue to work on implementation of reduced calorie nutrition plan ([facility])  Behavioral Intervention  We discussed the following Behavioral Modification Strategies today: increasing lean protein intake, decreasing simple carbohydrates , increasing vegetables, increasing lower glycemic fruits, increasing fiber rich foods, increasing water intake, work on meal planning and preparation, and planning for success.  Additional resources provided today: None  Recommended Physical Activity Goals  Jody Bryan has been advised to work up to 150 minutes of moderate intensity aerobic activity a week and strengthening exercises 2-3 times per week for cardiovascular health, weight loss maintenance and preservation of muscle mass.   She has agreed to :  Think about ways to increase daily physical activity and overcoming barriers to exercise and Increase physical activity in their day and reduce sedentary time (increase NEAT).  Pharmacotherapy We discussed various medication options to help Jody Bryan with her weight loss efforts and we both agreed to : continue with nutritional and behavioral strategies  ASSOCIATED CONDITIONS ADDRESSED TODAY  Vitamin B12 deficiency Assessment & Plan: Methylmalonic acid levels are normal so she does not have B12 deficiency.  Continue to monitor   Prediabetes Assessment & Plan:  Most recent A1c is  Lab Results  Component Value Date   HGBA1C 5.7 (H) 10/28/2022   HGBA1C 5.7 06/11/2015   She also has a mild degree of insulin resistance with a Homa IR score of 2.4 optimal less than 1.8.  This may contribute to weight gain.  Patient aware of disease state and risk of progression. This may contribute to abnormal cravings, fatigue and diabetic complications without having  diabetes.   We have discussed treatment options which include: losing 7 to 10% of body weight, increasing physical activity to a goal of 150 minutes a week at moderate  intensity.  Advised to maintain a diet low on simple and processed carbohydrates.  She may also be a candidate for pharmacoprophylaxis with metformin or incretin mimetic.     Suspected sleep apnea Assessment & Plan: She has an Epworth of 8.  She reports snoring, feeling tired and has nighttime awakenings.  Her BMI is above 35 and she has a mild elevation in blood pressure.  Her neck size is 15.5 inches and she has a Mallampati of 3-4.  Considering symptomatology and slow metabolism I think she would benefit from polysomnography.  She will discuss this with her PCP at her next follow-up.   Abnormal metabolism Assessment & Plan: Patient has a slower than predicted metabolism. IC 1512 vs. calculated 1948. This may contribute to weight gain, chronic fatigue and difficulty losing weight.   We reviewed measures to improve metabolism including not skipping meals, progressive strengthening exercises, increasing protein intake at every meal and maintaining adequate hydration and sleep.     Class 3 severe obesity with serious comorbidity and body mass index (BMI) of 40.0 to 44.9 in adult, unspecified obesity type [facility]) Assessment & Plan: Patient has lost 5 pounds BIA suggest a reduction in body fat percentage but also increase in muscle mass so she actually lost 8 pounds of fat.  She appears very motivated and we discussed goals for physical activity.  She will continue with implementation of reduced calorie nutrition plan.  She may also be a candidate for incretin therapy.  Patient also needs to be evaluated for disordered sleep breathing as this may affect her weight.     PHYSICAL EXAM:  Blood pressure 124/84, pulse 68, temperature 98.3 F (36.8 C), height 5\' 5"  (1.651 m), weight 247 lb (112 kg), last menstrual period 08/28/2020, SpO2 98%, currently breastfeeding. Body mass index is 41.1 kg/m.  General: She is overweight, cooperative, alert, well developed, and in no acute  distress. PSYCH: Has normal mood, affect and thought process.   HEENT: EOMI, sclerae are anicteric. Lungs: Normal breathing effort, no conversational dyspnea. Extremities: No edema.  Neurologic: No gross sensory or motor deficits. No tremors or fasciculations noted.    DIAGNOSTIC DATA REVIEWED:  BMET    Component Value Date/Time   NA 142 10/28/2022 0925   K 4.6 10/28/2022 0925   CL 104 10/28/2022 0925   CO2 22 10/28/2022 0925   GLUCOSE 84 10/28/2022 0925   GLUCOSE 81 05/18/2022 1543   BUN 13 10/28/2022 0925   CREATININE 0.79 10/28/2022 0925   CREATININE 0.82 06/08/2014 1627   CALCIUM 9.1 10/28/2022 0925   GFRNONAA >60 08/27/2020 0745   GFRAA 133 02/22/2020 1039   Lab Results  Component Value Date   HGBA1C 5.7 (H) 10/28/2022   HGBA1C 5.7 06/11/2015   Lab Results  Component Value Date   INSULIN 11.6 10/28/2022   INSULIN 25.7 (H) 03/17/2017   Lab Results  Component Value Date   TSH 1.38 05/18/2022   CBC    Component Value Date/Time   WBC 9.8 06/29/2022 1401   RBC 4.74 06/29/2022 1401   HGB 14.5 06/29/2022 1401   HGB 12.4 06/19/2020 0829   HCT 42.9 06/29/2022 1401   HCT 36.2 06/19/2020 0829   PLT 322.0 06/29/2022 1401   PLT 355 06/19/2020 0829   MCV 90.5 06/29/2022 1401   MCV 94  06/19/2020 0829   MCH 33.0 08/27/2020 0745   MCHC 33.9 06/29/2022 1401   RDW 13.4 06/29/2022 1401   RDW 12.9 06/19/2020 0829   Iron Studies No results found for: "IRON", "TIBC", "FERRITIN", "IRONPCTSAT" Lipid Panel     Component Value Date/Time   CHOL 173 05/18/2022 1543   CHOL 156 03/17/2017 1042   TRIG 127.0 05/18/2022 1543   HDL 61.70 05/18/2022 1543   HDL 47 03/17/2017 1042   CHOLHDL 3 05/18/2022 1543   VLDL 25.4 05/18/2022 1543   LDLCALC 86 05/18/2022 1543   LDLCALC 94 03/17/2017 1042   Hepatic Function Panel     Component Value Date/Time   PROT 7.0 10/28/2022 0925   ALBUMIN 4.4 10/28/2022 0925   AST 17 10/28/2022 0925   ALT 19 10/28/2022 0925   ALKPHOS 79  10/28/2022 0925   BILITOT 0.8 10/28/2022 0925   BILIDIR 0.2 06/11/2015 1024   IBILI 0.6 06/08/2014 1627      Component Value Date/Time   TSH 1.38 05/18/2022 1543   Nutritional Lab Results  Component Value Date   VD25OH 17.44 (L) 05/18/2022   VD25OH 24.61 (L) 08/12/2021   VD25OH 40.4 06/24/2017     Return in about 4 weeks (around 12/09/2022) for For Weight Mangement with Dr. Rikki Spearing.Marland Kitchen She was informed of the importance of frequent follow up visits to maximize her success with intensive lifestyle modifications for her multiple health conditions.   ATTESTASTION STATEMENTS:  Reviewed by clinician on day of visit: allergies, medications, problem list, medical history, surgical history, family history, social history, and previous encounter notes.   I have spent 40 minutes in the care of the patient today including: preparing to see patient (e.g. review and interpretation of tests, old notes ), obtaining and/or reviewing separately obtained history, performing a medically appropriate examination or evaluation, counseling and educating the patient, documenting clinical information in the electronic or other health care record, and independently interpreting results and communicating results to the patient, family, or caregiver   Worthy Rancher, MD

## 2022-11-11 NOTE — Telephone Encounter (Signed)
Initial Comment Caller states they are having back pain and they are not able to move. They would like to schedule a office visit. The caller cannot hold urine and left front thigh is feeling numb. Translation No Nurse Assessment Nurse: Caryn Bee, RN, [name] Date/Time (Eastern Time): 11/11/2022 8:51:57 AM Confirm and document reason for call. If symptomatic, describe symptoms. ---Caller states that the pain started last Thursday after bending over. Pt states she has been having urinary urgency since then. Does the patient have any new or worsening symptoms? ---Yes Will a triage be completed? ---Yes Related visit to physician within the last 2 weeks? ---No Does the PT have any chronic conditions? (i.e. diabetes, asthma, this includes High risk factors for pregnancy, etc.) ---Yes List chronic conditions. ---On birth control. Is the patient pregnant or possibly pregnant? (Ask all females between the ages of 47-55) ---No Is this a behavioral health or substance abuse call? ---No Guidelines Guideline Title Affirmed Question Affirmed Notes Nurse Date/Time (Eastern Time) Back Pain [1] SEVERE back pain (e.g., excruciating, unable to do any normal activities) AND [2] not improved 2 hours after pain medicine [name], RN, [name] 11/11/2022 8:55:30 AM PLEASE NOTE: All timestamps contained within this report are represented as [place] Standard Time. CONFIDENTIALTY NOTICE: This fax transmission is intended only for the addressee. It contains information that is legally privileged, confidential or otherwise protected from use or disclosure. If you are not the intended recipient, you are strictly prohibited from reviewing, disclosing, copying using or disseminating any of this information or taking any action in reliance on or regarding this information. If you have received this fax in error, please notify us immediately by telephone so that we can arrange for its return to [name]. Phone:  [phone], Toll-Free: [phone], Fax: [phone] Page: 2 of 2 Call Id: [id number] Disp. Time [name] Time) Disposition Final User 11/11/2022 8:59:19 AM See HCP within 4 Hours (or PCP triage) Yes Caryn Bee, RN, [name] Final Disposition 11/11/2022 8:59:19 AM See HCP within 4 Hours (or PCP triage) Yes [name], RN, [name] Caller Disagree/Comply Comply Caller Understands Yes PreDisposition Go to ED Care Advice Given Per Guideline SEE HCP (OR PCP TRIAGE) WITHIN 4 HOURS: * IF OFFICE WILL BE OPEN: You need to be seen within the next 3 or 4 hours. Call your doctor (or NP/PA) now or as soon as the office opens. CALL BACK IF: * You become worse CARE ADVICE given per Back Pain (Adult) guideline. Comments User: [name], RN Date/Time [name] Time): 11/11/2022 8:53:58 AM Pain is in the lower left back. User: [name], RN Date/Time [name] Time): 11/11/2022 8:56:13 AM Pain is a 7/10. User: [name], RN Date/Time [name] Time): 11/11/2022 9:05:27 AM Caller was warm transferred to backline for an appointment. Appointment was made for 11/12/2022 at 1pm. Referrals REFERRED TO PCP OFFICE Warm transfer to backline

## 2022-11-11 NOTE — Assessment & Plan Note (Signed)
Patient has a slower than predicted metabolism. IC 1512 vs. calculated 1948. This may contribute to weight gain, chronic fatigue and difficulty losing weight.  We reviewed measures to improve metabolism including not skipping meals, progressive strengthening exercises, increasing protein intake at every meal and maintaining adequate hydration and sleep.

## 2022-11-11 NOTE — Telephone Encounter (Signed)
Patient said she could barely get out of the bed this morning because her back pain is so bad. Transferred to triage for further evaluation.

## 2022-11-12 ENCOUNTER — Ambulatory Visit: Payer: Medicaid Other | Admitting: Physician Assistant

## 2022-11-12 ENCOUNTER — Encounter: Payer: Self-pay | Admitting: Physician Assistant

## 2022-11-12 VITALS — BP 126/82 | HR 72 | Temp 98.4°F | Resp 18 | Wt 253.6 lb

## 2022-11-12 DIAGNOSIS — R400 Somnolence: Secondary | ICD-10-CM | POA: Diagnosis not present

## 2022-11-12 DIAGNOSIS — M545 Low back pain, unspecified: Secondary | ICD-10-CM

## 2022-11-12 DIAGNOSIS — R350 Frequency of micturition: Secondary | ICD-10-CM

## 2022-11-12 LAB — POCT URINALYSIS DIP (MANUAL ENTRY)
Bilirubin, UA: NEGATIVE
Blood, UA: NEGATIVE
Glucose, UA: NEGATIVE mg/dL
Ketones, POC UA: NEGATIVE mg/dL
Leukocytes, UA: NEGATIVE
Nitrite, UA: NEGATIVE
Protein Ur, POC: NEGATIVE mg/dL
Spec Grav, UA: 1.015 (ref 1.010–1.025)
Urobilinogen, UA: 0.2 U/dL — NL
pH, UA: 6 (ref 5.0–8.0)

## 2022-11-12 MED ORDER — CELECOXIB 100 MG PO CAPS: 100.0000 mg | ORAL_CAPSULE | Freq: Two times a day (BID) | ORAL | 0 refills | Status: DC

## 2022-11-12 MED ORDER — BACLOFEN 10 MG PO TABS: 10.0000 mg | ORAL_TABLET | Freq: Three times a day (TID) | ORAL | 0 refills | Status: DC

## 2022-11-12 NOTE — Progress Notes (Signed)
Established patient visit   Patient: Jody Bryan   DOB: 04/29/1980   42 y.o. Female  MRN: [id number] Visit Date: 11/12/2022  Today's healthcare provider: Alfredia Ferguson, PA-C   Chief Complaint  Patient presents with   Back Pain    Lower left back pain.for 7 days. Hard to hold urine when she has to go to the bathroom.    Subjective    HPI Discussed the use of AI scribe software for clinical note transcription with the patient, who gave verbal consent to proceed.  History of Present Illness   The patient presents with left sided back pain that started after bending over to sweep. The pain has been persistent since the incident, around 7 days. She describes the pain as severe, preventing her from moving around freely. She has been managing the pain with ibuprofen and topical pain meds, which have provided some relief. The patient also applied ice, heat pads, and used aspirin cream and Biofreeze. The pain does not radiate down the legs, but she reports numbness in the left thigh after sitting for extended periods. The patient also reports increased frequency of urination since the onset of the back pain, which she attributes to the difficulty of getting out of bed due to the pain.    The patient also mentions a potential sleep apnea diagnosis suggested by a weight loss coach, and reports snoring and waking up groggy.       Patient's last menstrual period was 08/28/2020.   Medications: Outpatient Medications Prior to Visit  Medication Sig   cyclobenzaprine (FLEXERIL) 10 MG tablet Take 1 tablet (10 mg total) by mouth every 8 (eight) hours as needed for muscle spasms. Take one tablet before procedure.   Vitamin D, Ergocalciferol, (DRISDOL) 1.25 MG (50000 UNIT) CAPS capsule Take 1 capsule (50,000 Units total) by mouth every 7 (seven) days.   levonorgestrel (MIRENA) 20 MCG/DAY IUD 1 each by Intrauterine route once for 1 dose.   loratadine (CLARITIN) 10 MG tablet Take 10 mg by mouth  daily. (Patient not taking: Reported on 11/12/2022)   [DISCONTINUED] Olopatadine HCl 0.2 % SOLN 1 gtt bid (Patient not taking: Reported on 11/12/2022)   [DISCONTINUED] predniSONE (DELTASONE) 10 MG tablet TAKE 3 TABLETS PO QD FOR 3 DAYS THEN TAKE 2 TABLETS PO QD FOR 3 DAYS THEN TAKE 1 TABLET PO QD FOR 3 DAYS THEN TAKE 1/2 TAB PO QD FOR 3 DAYS (Patient not taking: Reported on 11/12/2022)   No facility-administered medications prior to visit.    Review of Systems  Constitutional:  Negative for fatigue and fever.  Respiratory:  Negative for cough and shortness of breath.   Cardiovascular:  Negative for chest pain and leg swelling.  Gastrointestinal:  Negative for abdominal pain.  Musculoskeletal:  Positive for back pain.  Neurological:  Negative for dizziness and headaches.      Objective    BP 126/82 (BP Location: Left Arm, Patient Position: Sitting, Cuff Size: Normal)   Pulse 72   Temp 98.4 F (36.9 C) (Oral)   Resp 18   Wt 253 lb 9.6 oz (115 kg)   LMP 08/28/2020   SpO2 98%   Breastfeeding No   BMI 42.20 kg/m   Physical Exam Vitals reviewed.  Constitutional:      Appearance: She is not ill-appearing.  HENT:     Head: Normocephalic.  Eyes:     Conjunctiva/sclera: Conjunctivae normal.  Cardiovascular:     Rate and Rhythm: Normal rate.  Pulmonary:  Effort: Pulmonary effort is normal. No respiratory distress.  Musculoskeletal:     Comments: Tender to L low back/buttock  No visible edema, rashes.  Neurological:     General: No focal deficit present.     Mental Status: She is alert and oriented to person, place, and time.  Psychiatric:        Mood and Affect: Mood normal.        Behavior: Behavior normal.     No results found for any visits on 11/12/22.  Assessment & Plan     1. Urinary frequency New onset since the back pain started -UA negative. -Monitor for any changes, such as inability to hold urine. If this occurs, patient to contact the office. - POCT  urinalysis dipstick  2. Acute left-sided low back pain without sciatica, strain -Continue with current home management strategies (ice, heat pads Biofreeze). -rx celebrex bid x 7 days -rx baclofen up to tid prn -If pain does not improve by Monday (11/16/2022), patient to contact the office. - celecoxib (CELEBREX) 100 MG capsule; Take 1 capsule (100 mg total) by mouth 2 (two) times daily.  Dispense: 14 capsule; Refill: 0 - baclofen (LIORESAL) 10 MG tablet; Take 1 tablet (10 mg total) by mouth 3 (three) times daily.  Dispense: 30 each; Refill: 0  3. Has daytime drowsiness Patient reports feeling groggy upon waking and waking up one to two times in the night. Family history of sleep apnea. Patient snores and has occasionally woken herself up. -Refer to a pulmonologist for further evaluation and potential sleep study.  - Ambulatory referral to Pulmonology    Return if symptoms worsen or fail to improve.      I, Alfredia Ferguson, PA-C have reviewed all documentation for this visit. The documentation on  11/12/22   for the exam, diagnosis, procedures, and orders are all accurate and complete.    Alfredia Ferguson, PA-C  [facility] [phone] (phone) [phone] (fax)  [facility]

## 2022-11-16 DIAGNOSIS — M9903 Segmental and somatic dysfunction of lumbar region: Secondary | ICD-10-CM | POA: Diagnosis not present

## 2022-11-16 DIAGNOSIS — M9905 Segmental and somatic dysfunction of pelvic region: Secondary | ICD-10-CM | POA: Diagnosis not present

## 2022-11-16 DIAGNOSIS — M9904 Segmental and somatic dysfunction of sacral region: Secondary | ICD-10-CM | POA: Diagnosis not present

## 2022-11-16 DIAGNOSIS — M5386 Other specified dorsopathies, lumbar region: Secondary | ICD-10-CM | POA: Diagnosis not present

## 2022-11-24 DIAGNOSIS — F4321 Adjustment disorder with depressed mood: Secondary | ICD-10-CM | POA: Diagnosis not present

## 2022-11-24 DIAGNOSIS — Z6379 Other stressful life events affecting family and household: Secondary | ICD-10-CM | POA: Diagnosis not present

## 2022-11-24 DIAGNOSIS — Z630 Problems in relationship with spouse or partner: Secondary | ICD-10-CM | POA: Diagnosis not present

## 2022-11-25 DIAGNOSIS — M5386 Other specified dorsopathies, lumbar region: Secondary | ICD-10-CM | POA: Diagnosis not present

## 2022-11-25 DIAGNOSIS — M9903 Segmental and somatic dysfunction of lumbar region: Secondary | ICD-10-CM | POA: Diagnosis not present

## 2022-11-25 DIAGNOSIS — M9904 Segmental and somatic dysfunction of sacral region: Secondary | ICD-10-CM | POA: Diagnosis not present

## 2022-11-25 DIAGNOSIS — M9905 Segmental and somatic dysfunction of pelvic region: Secondary | ICD-10-CM | POA: Diagnosis not present

## 2022-12-03 ENCOUNTER — Ambulatory Visit (INDEPENDENT_AMBULATORY_CARE_PROVIDER_SITE_OTHER): Payer: Medicaid Other

## 2022-12-03 DIAGNOSIS — Z1231 Encounter for screening mammogram for malignant neoplasm of breast: Secondary | ICD-10-CM | POA: Diagnosis not present

## 2022-12-10 ENCOUNTER — Ambulatory Visit: Payer: Medicaid Other

## 2022-12-14 ENCOUNTER — Ambulatory Visit (INDEPENDENT_AMBULATORY_CARE_PROVIDER_SITE_OTHER): Payer: Medicaid Other | Admitting: Internal Medicine

## 2022-12-15 DIAGNOSIS — F4323 Adjustment disorder with mixed anxiety and depressed mood: Secondary | ICD-10-CM | POA: Diagnosis not present

## 2023-01-07 ENCOUNTER — Encounter (INDEPENDENT_AMBULATORY_CARE_PROVIDER_SITE_OTHER): Payer: Self-pay | Admitting: Internal Medicine

## 2023-01-07 ENCOUNTER — Ambulatory Visit (INDEPENDENT_AMBULATORY_CARE_PROVIDER_SITE_OTHER): Payer: Medicaid Other | Admitting: Internal Medicine

## 2023-01-07 VITALS — BP 138/86 | HR 74 | Temp 98.7°F | Ht 65.0 in | Wt 246.0 lb

## 2023-01-07 DIAGNOSIS — R948 Abnormal results of function studies of other organs and systems: Secondary | ICD-10-CM

## 2023-01-07 DIAGNOSIS — R29818 Other symptoms and signs involving the nervous system: Secondary | ICD-10-CM

## 2023-01-07 DIAGNOSIS — E669 Obesity, unspecified: Secondary | ICD-10-CM

## 2023-01-07 DIAGNOSIS — Z6841 Body Mass Index (BMI) 40.0 and over, adult: Secondary | ICD-10-CM

## 2023-01-07 DIAGNOSIS — R7303 Prediabetes: Secondary | ICD-10-CM

## 2023-01-07 NOTE — Progress Notes (Signed)
Office: [phone]  /  Fax: [phone]  WEIGHT SUMMARY AND BIOMETRICS  Vitals Temp: 98.7 F (37.1 C) BP: 138/86 Pulse Rate: 74 SpO2: 98 %   Anthropometric Measurements Height: 5\' 5"  (1.651 m) Weight: 246 lb (111.6 kg) BMI (Calculated): 40.94 Weight at Last Visit: 247 lb Weight Lost Since Last Visit: 1 lb Weight Gained Since Last Visit: 0 lb Starting Weight: 252 lb Total Weight Loss (lbs): 6 lb (2.722 kg) Peak Weight: 270 lb   Body Composition  Body Fat %: 45.6 % Fat Mass (lbs): 112.2 lbs Muscle Mass (lbs): 127.2 lbs Total Body Water (lbs): 90.4 lbs Visceral Fat Rating : 13    RMR: 1512  Today's Visit #: 3  Starting Date: 10/28/22   HPI  Chief Complaint: OBESITY  Jody Bryan is here to discuss her progress with her obesity treatment plan. She is on the the Category 2 Plan and states she is following her eating plan approximately 25 % of the time. She states she is exercising 30 minutes 3 times per week.  Interval History:  Discussed the use of AI scribe software for clinical note transcription with the patient, who gave verbal consent to proceed.  History of Present Illness   The patient, a 42 year old individual with obesity, has a history of prediabetes, gestational diabetes, abnormal metabolism, and suspected sleep apnea. She reports struggling with weight management and has been seeking healthier dietary options.   The patient has been experiencing challenges with maintaining a regular eating pattern due to frequent travel and personal stressors, including a seriously ill family member. She reports a history of skipping meals and has previously experienced rapid weight loss through a restrictive diet involving dietary drops.  The patient's weight has fluctuated significantly over the past few years, with a notable increase following a recent pregnancy. She expresses a desire to avoid weight loss surgery and is interested in non-surgical, lifestyle-based  approaches to weight management.  The patient acknowledges the need for increased physical activity and is open to incorporating more protein into her diet. She has expressed interest in protein shakes as a potential meal replacement option during periods of travel.  The patient has been referred for a sleep study due to suspected sleep apnea, which may be contributing to her metabolic issues.      Orexigenic Control: Denies problems with appetite and hunger signals.  Denies problems with satiety and satiation.  Denies problems with eating patterns and portion control.  Denies abnormal cravings. Denies feeling deprived or restricted.   Barriers identified: none.   Pharmacotherapy for weight loss: She is currently taking no anti-obesity medication.    ASSESSMENT AND PLAN  TREATMENT PLAN FOR OBESITY:  Recommended Dietary Goals  Jody Bryan is currently in the action stage of change. As such, her goal is to continue weight management plan. She has agreed to: continue current plan  Behavioral Intervention  We discussed the following Behavioral Modification Strategies today: continue to work on maintaining a reduced calorie state, getting the recommended amount of protein, incorporating whole foods, making healthy choices, staying well hydrated and practicing mindfulness when eating..  Additional resources provided today: None  Recommended Physical Activity Goals  [name] has been advised to work up to 150 minutes of moderate intensity aerobic activity a week and strengthening exercises 2-3 times per week for cardiovascular health, weight loss maintenance and preservation of muscle mass.   She has agreed to :  Think about enjoyable ways to increase daily physical activity and overcoming barriers to exercise  and Increase physical activity in their day and reduce sedentary time (increase NEAT).  Pharmacotherapy We discussed various medication options to help Jody Bryan with her weight  loss efforts and we both agreed to : continue with nutritional and behavioral strategies  ASSOCIATED CONDITIONS ADDRESSED TODAY  Assessment and Plan    Obesity and Slow Metabolism   We discussed the importance of regular meals, adequate protein intake, and increased physical activity to enhance metabolism. The concept of a balanced plate and making healthy food choices were reviewed, along with the potential impact of sleep apnea and previous rapid weight loss on metabolism. The plan includes increasing physical activity with a focus on challenging exercises, ensuring regular meals with sufficient protein, and considering protein shakes as meal replacements if necessary. She should avoid simple and added sugars and opt for healthier restaurant options when eating out. A follow-up in 3 weeks will assess progress.  Prediabetes   With an A1c of 5.7 and no medications currently started, we discussed the critical role of diet in managing prediabetes and preventing its progression to diabetes. The potential benefits of metformin were mentioned, but we decided to hold off on medications for now. She will continue a low sugar diet and consider metformin in the future if necessary.  Suspected Sleep Apnea   We discussed the potential impact of sleep apnea on her metabolism and weight. She has been referred for a sleep study. The plan is to complete the scheduled sleep study and follow up on the results and potential treatment options.       PHYSICAL EXAM:  Blood pressure 138/86, pulse 74, temperature 98.7 F (37.1 C), height 5\' 5"  (1.651 m), weight 246 lb (111.6 kg), SpO2 98%. Body mass index is 40.94 kg/m.  General: She is overweight, cooperative, alert, well developed, and in no acute distress. PSYCH: Has normal mood, affect and thought process.   HEENT: EOMI, sclerae are anicteric. Lungs: Normal breathing effort, no conversational dyspnea. Extremities: No edema.  Neurologic: No gross sensory or  motor deficits. No tremors or fasciculations noted.    DIAGNOSTIC DATA REVIEWED:  BMET    Component Value Date/Time   NA 142 10/28/2022 0925   K 4.6 10/28/2022 0925   CL 104 10/28/2022 0925   CO2 22 10/28/2022 0925   GLUCOSE 84 10/28/2022 0925   GLUCOSE 81 05/18/2022 1543   BUN 13 10/28/2022 0925   CREATININE 0.79 10/28/2022 0925   CREATININE 0.82 06/08/2014 1627   CALCIUM 9.1 10/28/2022 0925   GFRNONAA >60 08/27/2020 0745   GFRAA 133 02/22/2020 1039   Lab Results  Component Value Date   HGBA1C 5.7 (H) 10/28/2022   HGBA1C 5.7 06/11/2015   Lab Results  Component Value Date   INSULIN 11.6 10/28/2022   INSULIN 25.7 (H) 03/17/2017   Lab Results  Component Value Date   TSH 1.38 05/18/2022   CBC    Component Value Date/Time   WBC 9.8 06/29/2022 1401   RBC 4.74 06/29/2022 1401   HGB 14.5 06/29/2022 1401   HGB 12.4 06/19/2020 0829   HCT 42.9 06/29/2022 1401   HCT 36.2 06/19/2020 0829   PLT 322.0 06/29/2022 1401   PLT 355 06/19/2020 0829   MCV 90.5 06/29/2022 1401   MCV 94 06/19/2020 0829   MCH 33.0 08/27/2020 0745   MCHC 33.9 06/29/2022 1401   RDW 13.4 06/29/2022 1401   RDW 12.9 06/19/2020 0829   Iron Studies No results found for: "IRON", "TIBC", "FERRITIN", "IRONPCTSAT" Lipid Panel  Component Value Date/Time   CHOL 173 05/18/2022 1543   CHOL 156 03/17/2017 1042   TRIG 127.0 05/18/2022 1543   HDL 61.70 05/18/2022 1543   HDL 47 03/17/2017 1042   CHOLHDL 3 05/18/2022 1543   VLDL 25.4 05/18/2022 1543   LDLCALC 86 05/18/2022 1543   LDLCALC 94 03/17/2017 1042   Hepatic Function Panel     Component Value Date/Time   PROT 7.0 10/28/2022 0925   ALBUMIN 4.4 10/28/2022 0925   AST 17 10/28/2022 0925   ALT 19 10/28/2022 0925   ALKPHOS 79 10/28/2022 0925   BILITOT 0.8 10/28/2022 0925   BILIDIR 0.2 06/11/2015 1024   IBILI 0.6 06/08/2014 1627      Component Value Date/Time   TSH 1.38 05/18/2022 1543   Nutritional Lab Results  Component Value Date    VD25OH 17.44 (L) 05/18/2022   VD25OH 24.61 (L) 08/12/2021   VD25OH 40.4 06/24/2017     No follow-ups on file.Marland Kitchen She was informed of the importance of frequent follow up visits to maximize her success with intensive lifestyle modifications for her multiple health conditions.   ATTESTASTION STATEMENTS:  Reviewed by clinician on day of visit: allergies, medications, problem list, medical history, surgical history, family history, social history, and previous encounter notes.   I have spent 30 minutes in the care of the patient today including: preparing to see patient (e.g. review and interpretation of tests, old notes ), obtaining and/or reviewing separately obtained history, performing a medically appropriate examination or evaluation, counseling and educating the patient, documenting clinical information in the electronic or other health care record, and independently interpreting results and communicating results to the patient, family, or caregiver   Worthy Rancher, MD

## 2023-01-08 DIAGNOSIS — F430 Acute stress reaction: Secondary | ICD-10-CM | POA: Diagnosis not present

## 2023-01-08 DIAGNOSIS — Z763 Healthy person accompanying sick person: Secondary | ICD-10-CM | POA: Diagnosis not present

## 2023-01-10 NOTE — Progress Notes (Signed)
01/12/23- 74 yoF former smoker for sleep evaluation courtesy of Alfredia Ferguson, PA-C no sleep study Medical problem list includes GERD, Meralgia Paresthetica, Obesity,  Epworth score 14 Body weight today-252 lbs Complain of repeated waking after sleep onset. No witness with her, but she thinks her snoring wakes her. Sister and father using CPAP. Daytime tiredness. 30 lb weight gain in past 2 years. Takes melatonin for sleep. Little caffeine except Red Bull 2/ week. No hx ENT surgery, heart of lung problems.  Prior to Admission medications   Medication Sig Start Date End Date Taking? Authorizing Provider  Vitamin D, Ergocalciferol, (DRISDOL) 1.25 MG (50000 UNIT) CAPS capsule Take 1 capsule (50,000 Units total) by mouth every 7 (seven) days. 05/18/22  Yes Seabron Spates R, DO  baclofen (LIORESAL) 10 MG tablet Take 1 tablet (10 mg total) by mouth 3 (three) times daily. Patient not taking: Reported on 01/12/2023 11/12/22   Alfredia Ferguson, PA-C  celecoxib (CELEBREX) 100 MG capsule Take 1 capsule (100 mg total) by mouth 2 (two) times daily. Patient not taking: Reported on 01/12/2023 11/12/22   Alfredia Ferguson, PA-C  cyclobenzaprine (FLEXERIL) 10 MG tablet Take 1 tablet (10 mg total) by mouth every 8 (eight) hours as needed for muscle spasms. Take one tablet before procedure. Patient not taking: Reported on 01/12/2023 06/29/22   Donato Schultz, DO  levonorgestrel (MIRENA) 20 MCG/DAY IUD 1 each by Intrauterine route once for 1 dose. 05/18/22 10/28/22  Donato Schultz, DO  loratadine (CLARITIN) 10 MG tablet Take 10 mg by mouth daily. Patient not taking: Reported on 01/12/2023    [provider]   Past Medical History:  Diagnosis Date   Bilateral leg edema    Chicken pox    Depression    Drug use    Dyspnea    GERD (gastroesophageal reflux disease)    Low back pain    Past Surgical History:  Procedure Laterality Date   WISDOM TOOTH EXTRACTION  08/2019   Family History   Problem Relation Age of Onset   Hypertension Mother    Obesity Mother    Depression Mother    Hypertension Father    Depression Father    Sleep apnea Father    Drug abuse Father    Obesity Father    Cancer Father    Alcohol abuse Sister    Stroke Sister    Heart disease Maternal Grandmother    Alcohol abuse Maternal Grandfather    Arthritis Paternal Grandmother    Cancer Paternal Grandmother        breast   Social History   Socioeconomic History   Marital status: Single    Spouse name: Not on file   Number of children: 1   Years of education: Not on file   Highest education level: Bachelor's degree (e.g., BA, AB, BS)  Occupational History   Occupation:  call center   Occupation: Stay at home  Tobacco Use   Smoking status: Former    Types: Cigarettes   Smokeless tobacco: Never  Vaping Use   Vaping status: Never Used  Substance and Sexual Activity   Alcohol use: Yes    Alcohol/week: 0.0 standard drinks of alcohol    Comment: social   Drug use: Not Currently   Sexual activity: Yes    Birth control/protection: I.U.D.  Other Topics Concern   Not on file  Social History Narrative   Exercise ---  5 days a week for 40 min   Social  Determinants of Health   Financial Resource Strain: Patient Declined (06/29/2022)   Overall Financial Resource Strain (CARDIA)    Difficulty of Paying Living Expenses: Patient declined  Food Insecurity: Patient Declined (06/29/2022)   Hunger Vital Sign    Worried About Running Out of Food in the Last Year: Patient declined    Ran Out of Food in the Last Year: Patient declined  Transportation Needs: Unknown (06/29/2022)   PRAPARE - Administrator, Civil Service (Medical): No    Lack of Transportation (Non-Medical): Patient declined  Physical Activity: Unknown (06/29/2022)   Exercise Vital Sign    Days of Exercise per Week: Patient declined    Minutes of Exercise per Session: Not on file  Stress: No Stress Concern Present  (06/29/2022)   [employer] of Occupational Health - Occupational Stress Questionnaire    Feeling of Stress : Only a little  Social Connections: Moderately Integrated (06/29/2022)   Social Connection and Isolation Panel [NHANES]    Frequency of Communication with Friends and Family: More than three times a week    Frequency of Social Gatherings with Friends and Family: Once a week    Attends Religious Services: More than 4 times per year    Active Member of [employer] or Organizations: Yes    Attends Engineer, structural: More than 4 times per year    Marital Status: Never married  Intimate Partner Violence: Not on file   ROS-see HPI   + = positive Constitutional:    weight loss, night sweats, fevers, chills, fatigue, lassitude. HEENT:    headaches, difficulty swallowing, tooth/dental problems, sore throat,       sneezing, itching, ear ache, nasal congestion, post nasal drip, snoring CV:    chest pain, orthopnea, PND, swelling in lower extremities, anasarca,                                  dizziness, palpitations Resp:   shortness of breath with exertion or at rest.                productive cough,   non-productive cough, coughing up of blood.              change in color of mucus.  wheezing.   Skin:    rash or lesions. GI:  No-   heartburn, indigestion, abdominal pain, nausea, vomiting, diarrhea,                 change in bowel habits, loss of appetite GU: dysuria, change in color of urine, no urgency or frequency.   flank pain. MS:   joint pain, stiffness, decreased range of motion, back pain. Neuro-     nothing unusual Psych:  change in mood or affect.  depression or anxiety.   memory loss.  OBJ- Physical Exam General- Alert, Oriented, Affect-appropriate, Distress- none acute, + obese Skin- rash-none, lesions- none, excoriation- none Lymphadenopathy- none Head- atraumatic            Eyes- Gross vision intact, PERRLA, conjunctivae and secretions clear            Ears-  Hearing, canals-normal            Nose- Clear, no-Septal dev, mucus, polyps, erosion, perforation             Throat- Mallampati IV , mucosa clear , drainage- none, tonsils- atrophic, +teeth Neck- flexible , trachea midline, no  stridor , thyroid nl, carotid no bruit Chest - symmetrical excursion , unlabored           Heart/CV- RRR , no murmur , no gallop  , no rub, nl s1 s2                           - JVD- none , edema- none, stasis changes- none, varices- none           Lung- clear to P&A, wheeze- none, cough- none , dullness-none, rub- none           Chest wall-  Abd-  Br/ Gen/ Rectal- Not done, not indicated Extrem- cyanosis- none, clubbing, none, atrophy- none, strength- nl Neuro- grossly intact to observation

## 2023-01-12 ENCOUNTER — Ambulatory Visit: Payer: Medicaid Other | Admitting: Internal Medicine

## 2023-01-12 ENCOUNTER — Encounter: Payer: Self-pay | Admitting: Internal Medicine

## 2023-01-12 VITALS — BP 126/78 | HR 86 | Ht 66.0 in | Wt 252.0 lb

## 2023-01-12 DIAGNOSIS — E66813 Obesity, class 3: Secondary | ICD-10-CM

## 2023-01-12 DIAGNOSIS — Z6841 Body Mass Index (BMI) 40.0 and over, adult: Secondary | ICD-10-CM

## 2023-01-12 DIAGNOSIS — R0683 Snoring: Secondary | ICD-10-CM | POA: Diagnosis not present

## 2023-01-12 DIAGNOSIS — R0681 Apnea, not elsewhere classified: Secondary | ICD-10-CM

## 2023-01-12 NOTE — Patient Instructions (Signed)
Order- Schedule home sleep test    dx Snoring  Please call me about 2 weeks after your sleep test for results and recommendations

## 2023-01-15 ENCOUNTER — Ambulatory Visit (INDEPENDENT_AMBULATORY_CARE_PROVIDER_SITE_OTHER): Payer: Medicaid Other

## 2023-01-15 DIAGNOSIS — R0681 Apnea, not elsewhere classified: Secondary | ICD-10-CM

## 2023-01-25 DIAGNOSIS — F4321 Adjustment disorder with depressed mood: Secondary | ICD-10-CM | POA: Diagnosis not present

## 2023-01-25 DIAGNOSIS — Z636 Dependent relative needing care at home: Secondary | ICD-10-CM | POA: Diagnosis not present

## 2023-02-01 ENCOUNTER — Encounter: Payer: Self-pay | Admitting: Internal Medicine

## 2023-02-01 DIAGNOSIS — R0683 Snoring: Secondary | ICD-10-CM | POA: Insufficient documentation

## 2023-02-01 NOTE — Assessment & Plan Note (Signed)
Probable OSA. Appropriate discussion and questions answered. Plan- sleep study

## 2023-02-01 NOTE — Assessment & Plan Note (Signed)
Significant obesity. Weight loss will need lifestyle change and probably external support program. She is to discuss with PCP.

## 2023-02-08 ENCOUNTER — Telehealth: Payer: Self-pay | Admitting: Internal Medicine

## 2023-02-08 ENCOUNTER — Ambulatory Visit (INDEPENDENT_AMBULATORY_CARE_PROVIDER_SITE_OTHER): Payer: Medicaid Other | Admitting: Internal Medicine

## 2023-02-08 NOTE — Telephone Encounter (Signed)
Patient would like results of home sleep test. Patient phone number is [phone].

## 2023-02-15 NOTE — Telephone Encounter (Signed)
Sleep study shows only occasional apneas, within normal limits. No treatment is necessary. Try to keep your weight down, and sleep off flat of back.

## 2023-02-16 NOTE — Telephone Encounter (Signed)
Pt notified results of sleep study.

## 2023-02-22 DIAGNOSIS — Z634 Disappearance and death of family member: Secondary | ICD-10-CM | POA: Diagnosis not present

## 2023-02-22 DIAGNOSIS — F4321 Adjustment disorder with depressed mood: Secondary | ICD-10-CM | POA: Diagnosis not present

## 2023-03-17 DIAGNOSIS — F4321 Adjustment disorder with depressed mood: Secondary | ICD-10-CM | POA: Diagnosis not present

## 2023-03-23 ENCOUNTER — Ambulatory Visit: Payer: Self-pay | Admitting: Family Medicine

## 2023-03-23 ENCOUNTER — Other Ambulatory Visit: Payer: Self-pay | Admitting: Family

## 2023-03-23 ENCOUNTER — Ambulatory Visit: Payer: Medicaid Other | Admitting: Family

## 2023-03-23 ENCOUNTER — Encounter: Payer: Self-pay | Admitting: Family

## 2023-03-23 VITALS — BP 160/102 | Ht 66.0 in | Wt 252.0 lb

## 2023-03-23 DIAGNOSIS — M545 Low back pain, unspecified: Secondary | ICD-10-CM | POA: Diagnosis not present

## 2023-03-23 DIAGNOSIS — M9904 Segmental and somatic dysfunction of sacral region: Secondary | ICD-10-CM | POA: Diagnosis not present

## 2023-03-23 DIAGNOSIS — M5386 Other specified dorsopathies, lumbar region: Secondary | ICD-10-CM | POA: Diagnosis not present

## 2023-03-23 DIAGNOSIS — G8929 Other chronic pain: Secondary | ICD-10-CM | POA: Diagnosis not present

## 2023-03-23 DIAGNOSIS — M25552 Pain in left hip: Secondary | ICD-10-CM

## 2023-03-23 DIAGNOSIS — M9903 Segmental and somatic dysfunction of lumbar region: Secondary | ICD-10-CM | POA: Diagnosis not present

## 2023-03-23 DIAGNOSIS — M9905 Segmental and somatic dysfunction of pelvic region: Secondary | ICD-10-CM | POA: Diagnosis not present

## 2023-03-23 MED ORDER — KETOROLAC TROMETHAMINE 30 MG/ML IJ SOLN
30.0000 mg | Freq: Once | INTRAMUSCULAR | Status: AC
  Administered 2023-03-23: 30 mg via INTRAMUSCULAR

## 2023-03-23 MED ORDER — METHOCARBAMOL 500 MG PO TABS
500.0000 mg | ORAL_TABLET | Freq: Three times a day (TID) | ORAL | 0 refills | Status: DC | PRN
Start: 2023-03-23 — End: 2023-04-05

## 2023-03-23 MED ORDER — MELOXICAM 15 MG PO TABS
15.0000 mg | ORAL_TABLET | Freq: Every day | ORAL | 0 refills | Status: DC
Start: 2023-03-23 — End: 2023-04-20

## 2023-03-23 NOTE — Progress Notes (Signed)
Jody Bryan is a 43 y.o. female with the following history as recorded in Epic[employer]:  Patient Active Problem List   Diagnosis Date Noted   Snoring 02/01/2023   Hx gestational diabetes 10/28/2022   Suspected sleep apnea 10/28/2022   Abnormal metabolism 10/28/2022   Leukocytosis 06/29/2022   Urinary retention 05/28/2022   Allergic conjunctivitis of both eyes 05/28/2022   Urinary frequency 05/28/2022   Contraception, device intrauterine 05/18/2022   Vitamin B12 deficiency 05/18/2022   Inguinal pain 08/12/2021   Class 3 severe obesity with serious comorbidity and body mass index (BMI) of 40.0 to 44.9 in adult (HCC) 07/11/2021   De Quervain's tenosynovitis, bilateral 01/17/2021   Renal cyst 12/22/2019   Bulging lumbar disc 12/22/2019   Preventative health care 08/09/2017   Vitamin D deficiency 04/01/2017   Prediabetes 04/01/2017   Other fatigue 03/17/2017   Low back pain 07/23/2014   Right wrist pain 09/05/2012   Meralgia paraesthetica 05/25/2012    Current Outpatient Medications  Medication Sig Dispense Refill   meloxicam (MOBIC) 15 MG tablet Take 1 tablet (15 mg total) by mouth daily. 30 tablet 0   methocarbamol (ROBAXIN) 500 MG tablet Take 1 tablet (500 mg total) by mouth every 8 (eight) hours as needed for muscle spasms. 30 tablet 0   Vitamin D, Ergocalciferol, (DRISDOL) 1.25 MG (50000 UNIT) CAPS capsule Take 1 capsule (50,000 Units total) by mouth every 7 (seven) days. 12 capsule 1   levonorgestrel (MIRENA) 20 MCG/DAY IUD 1 each by Intrauterine route once for 1 dose. 1 each 0   No current facility-administered medications for this visit.    Allergies: Patient has no known allergies.  Past Medical History:  Diagnosis Date   Bilateral leg edema    Chicken pox    Depression    Drug use    Dyspnea    GERD (gastroesophageal reflux disease)    Low back pain     Past Surgical History:  Procedure Laterality Date   WISDOM TOOTH EXTRACTION  08/2019    Family History   Problem Relation Age of Onset   Hypertension Mother    Obesity Mother    Depression Mother    Hypertension Father    Depression Father    Sleep apnea Father    Drug abuse Father    Obesity Father    Cancer Father    Alcohol abuse Sister    Stroke Sister    Heart disease Maternal Grandmother    Alcohol abuse Maternal Grandfather    Arthritis Paternal Grandmother    Cancer Paternal Grandmother        breast    Social History   Tobacco Use   Smoking status: Former    Types: Cigarettes   Smokeless tobacco: Never  Substance Use Topics   Alcohol use: Yes    Alcohol/week: 0.0 standard drinks of alcohol    Comment: social    Subjective:   Patient notes on Saturday she lifted a large trash can at her job site- notes that did not feel any pain at time of injury;  woke up the next morning feeling pain in her low back; notes that pain has progressively worsened over the past few days; went to chiropractor earlier today but was unable to get much relief from treatment;  Notes has had chronic issues with her low back; records indicate that MRI was ordered in 2018 but no records available for review;  No loss of control of bowel or bladder; pain feels like radiating  from low back/ left hip into lower extremity;     Objective:  Vitals:   03/23/23 1445  BP: (!) 160/102  Weight: 252 lb (114.3 kg)  Height: 5\' 6"  (1.676 m)    General: Well developed, well nourished, in no acute distress  Skin : Warm and dry.  Head: Normocephalic and atraumatic  Lungs: Respirations unlabored; clear to auscultation bilaterally without wheeze, rales, rhonchi  CVS exam: normal rate and regular rhythm.  Abdomen: Soft; nontender; nondistended; normoactive bowel sounds; no masses or hepatosplenomegaly  Musculoskeletal: No deformities; no active joint inflammation  Extremities: No edema, cyanosis, clubbing  Vessels: Symmetric bilaterally  Neurologic: Alert and oriented; speech intact; face symmetrical;  moves all extremities well; CNII-XII intact without focal deficit   Assessment:  1. Chronic low back pain, unspecified back pain laterality, unspecified whether sciatica present   2. Left hip pain     Plan:  Patient is in obvious pain today; she defers using oral prednisone as she has not found this beneficial the past; she does not want any type of narcotic due to concerns for being home with young children alone; Toradol IM 30 mg given in office with some relief noticed by time patient was leaving OV; she will start Mobic tomorrow and Robaxin 500 mg tid prn for muscle relaxer; she will return for X-ray of lumbar spine and left hip in the next few days- to also consider updating lumbar MRI;  Work note given for today- Thursday; follow up to be determined;   No follow-ups on file.  Orders Placed This Encounter  Procedures   DG HIP UNILAT W OR W/O PELVIS 2-3 VIEWS LEFT    Standing Status:   Future    Expiration Date:   09/20/2023    Reason for Exam (SYMPTOM  OR DIAGNOSIS REQUIRED):   left hip pain    Is the patient pregnant?:   No    Preferred imaging location?:   [facility]   DG Lumbar Spine Complete    Standing Status:   Future    Expiration Date:   03/22/2024    Reason for Exam (SYMPTOM  OR DIAGNOSIS REQUIRED):   low back pain    Is patient pregnant?:   No    Preferred imaging location?:   [facility]    Requested Prescriptions   Signed Prescriptions Disp Refills   meloxicam (MOBIC) 15 MG tablet 30 tablet 0    Sig: Take 1 tablet (15 mg total) by mouth daily.   methocarbamol (ROBAXIN) 500 MG tablet 30 tablet 0    Sig: Take 1 tablet (500 mg total) by mouth every 8 (eight) hours as needed for muscle spasms.

## 2023-03-23 NOTE — Telephone Encounter (Signed)
Copied from CRM [id number]. Topic: Clinical - Red Word Triage >> Mar 23, 2023 11:25 AM Claudine Mouton wrote: Red Word that prompted transfer to Nurse Triage: Patient called to advise that Saturday, she tried to pick up something heavy. She had no symptoms afterward at first. Yesterday, she woke up with severe pain in her lower back. Patient advised that sometimes the pain takes her breath away. Would rate it at a 9. Red Word: PAIN.  Chief Complaint: pain Symptoms: left lower back pain, radiates to side, hip and thigh area with numbness and tingling in  toes Frequency: started Sunday  Pertinent Negatives: Patient denies SOB, chest pain Disposition: [] ED /[] Urgent Care (no appt availability in office) / [x] Appointment(In office/virtual)/ []  [facility]/ [] Home Care/ [] Refused Recommended Disposition /[] [facility]/ []  Follow-up with PCP Additional Notes: Saturday patient lifted heavy object and Sunday pain began.  Reason for Disposition  [1] SEVERE back pain (e.g., excruciating, unable to do any normal activities) AND [2] not improved 2 hours after pain medicine  Answer Assessment - Initial Assessment Questions 1. ONSET: "When did the pain begin?"      Sunday 2. LOCATION: "Where does it hurt?" (upper, mid or lower back)     Left lower back  3. SEVERITY: "How bad is the pain?"  (e.g., Scale 1-10; mild, moderate, or severe)   - MILD (1-3): Doesn't interfere with normal activities.    - MODERATE (4-7): Interferes with normal activities or awakens from sleep.    - SEVERE (8-10): Excruciating pain, unable to do any normal activities.      9/10 4. PATTERN: "Is the pain constant?" (e.g., yes, no; constant, intermittent)      constant 5. RADIATION: "Does the pain shoot into your legs or somewhere else?"     Goes to side and front of thigh and hip - some numbness & tingling toes 6. CAUSE:  "What do you think is causing the back pain?"      Saturday lifting large trash can 7. BACK  OVERUSE:  "Any recent lifting of heavy objects, strenuous work or exercise?"     Yes  8. MEDICINES: "What have you taken so far for the pain?" (e.g., nothing, acetaminophen, NSAIDS)     BC powder without relief 9. NEUROLOGIC SYMPTOMS: "Do you have any weakness, numbness, or problems with bowel/bladder control?"     yes 10. OTHER SYMPTOMS: "Do you have any other symptoms?" (e.g., fever, abdomen pain, burning with urination, blood in urine)       no 11. PREGNANCY: "Is there any chance you are pregnant?" "When was your last menstrual period?"       N/a  Protocols used: Back Pain-A-AH

## 2023-03-25 DIAGNOSIS — Z630 Problems in relationship with spouse or partner: Secondary | ICD-10-CM | POA: Diagnosis not present

## 2023-03-25 DIAGNOSIS — F4321 Adjustment disorder with depressed mood: Secondary | ICD-10-CM | POA: Diagnosis not present

## 2023-03-29 ENCOUNTER — Ambulatory Visit (HOSPITAL_BASED_OUTPATIENT_CLINIC_OR_DEPARTMENT_OTHER)
Admission: RE | Admit: 2023-03-29 | Discharge: 2023-03-29 | Disposition: A | Discharge status: Home / Self Care | Payer: Medicaid Other | Source: Ambulatory Visit | Attending: Family | Admitting: Family

## 2023-03-29 ENCOUNTER — Ambulatory Visit: Payer: Medicaid Other | Admitting: Family Medicine

## 2023-03-29 ENCOUNTER — Encounter: Payer: Self-pay | Admitting: Family Medicine

## 2023-03-29 VITALS — BP 124/80 | HR 91 | Temp 98.2°F | Resp 18 | Ht 66.0 in | Wt 254.2 lb

## 2023-03-29 DIAGNOSIS — M5136 Other intervertebral disc degeneration, lumbar region with discogenic back pain only: Secondary | ICD-10-CM | POA: Diagnosis not present

## 2023-03-29 DIAGNOSIS — M9903 Segmental and somatic dysfunction of lumbar region: Secondary | ICD-10-CM | POA: Diagnosis not present

## 2023-03-29 DIAGNOSIS — M5442 Lumbago with sciatica, left side: Secondary | ICD-10-CM

## 2023-03-29 DIAGNOSIS — G8929 Other chronic pain: Secondary | ICD-10-CM | POA: Diagnosis not present

## 2023-03-29 DIAGNOSIS — M9905 Segmental and somatic dysfunction of pelvic region: Secondary | ICD-10-CM | POA: Diagnosis not present

## 2023-03-29 DIAGNOSIS — M25552 Pain in left hip: Secondary | ICD-10-CM | POA: Diagnosis not present

## 2023-03-29 DIAGNOSIS — M4807 Spinal stenosis, lumbosacral region: Secondary | ICD-10-CM | POA: Diagnosis not present

## 2023-03-29 DIAGNOSIS — M545 Low back pain, unspecified: Secondary | ICD-10-CM | POA: Diagnosis not present

## 2023-03-29 DIAGNOSIS — M9904 Segmental and somatic dysfunction of sacral region: Secondary | ICD-10-CM | POA: Diagnosis not present

## 2023-03-29 DIAGNOSIS — M5386 Other specified dorsopathies, lumbar region: Secondary | ICD-10-CM | POA: Diagnosis not present

## 2023-03-29 MED ORDER — CYCLOBENZAPRINE HCL 10 MG PO TABS: 10.0000 mg | ORAL_TABLET | Freq: Three times a day (TID) | ORAL | 0 refills | Status: DC | PRN

## 2023-03-29 NOTE — Progress Notes (Signed)
Established Patient Office Visit  Subjective   Patient ID: Jody Bryan, female    DOB: [birth year]  Age: 43 y.o. MRN: [id number]  Chief Complaint  Patient presents with   Back Injury    Pt seen by Vernona Rieger on 03/23/23. Pt states pain has improved and needs letter to return to work   Follow-up    HPI Discussed the use of AI scribe software for clinical note transcription with the patient, who gave verbal consent to proceed.  History of Present Illness   The patient, with an unspecified history of chronic back pain, presents with an acute exacerbation of her condition. She reports that the pain was triggered by lifting a heavy object a few days prior to the consultation. The patient describes the pain as initially localized to the lower back, but it subsequently radiated down the leg, causing numbness in the toes. However, at the time of the consultation, the pain was confined to the hip area. The patient also reported experiencing muscle spasms and cramps in the buttock area, particularly at night, which disrupted her sleep.  The patient sought initial treatment from a chiropractor, which provided temporary relief. She was also prescribed methocarbamol and Mobic, which she reported as being somewhat effective, particularly when the methocarbamol was taken more frequently. However, the patient still experienced significant discomfort, particularly when bending over or leaning back.  The patient's condition has significantly impacted her daily activities, including her ability to work. She reported difficulty with tasks such as getting dressed, getting in and out of a car, and even walking. Despite some improvement, the patient was still experiencing considerable pain and mobility issues at the time of the consultation.      Patient Active Problem List   Diagnosis Date Noted   Snoring 02/01/2023   Hx gestational diabetes 10/28/2022   Suspected sleep apnea 10/28/2022   Abnormal metabolism  10/28/2022   Leukocytosis 06/29/2022   Urinary retention 05/28/2022   Allergic conjunctivitis of both eyes 05/28/2022   Urinary frequency 05/28/2022   Contraception, device intrauterine 05/18/2022   Vitamin B12 deficiency 05/18/2022   Inguinal pain 08/12/2021   Class 3 severe obesity with serious comorbidity and body mass index (BMI) of 40.0 to 44.9 in adult (HCC) 07/11/2021   De Quervain's tenosynovitis, bilateral 01/17/2021   Renal cyst 12/22/2019   Bulging lumbar disc 12/22/2019   Preventative health care 08/09/2017   Vitamin D deficiency 04/01/2017   Prediabetes 04/01/2017   Other fatigue 03/17/2017   Low back pain 07/23/2014   Right wrist pain 09/05/2012   Meralgia paraesthetica 05/25/2012   Past Medical History:  Diagnosis Date   Bilateral leg edema    Chicken pox    Depression    Drug use    Dyspnea    GERD (gastroesophageal reflux disease)    Low back pain    Past Surgical History:  Procedure Laterality Date   WISDOM TOOTH EXTRACTION  08/2019   Social History   Tobacco Use   Smoking status: Former    Types: Cigarettes   Smokeless tobacco: Never  Vaping Use   Vaping status: Never Used  Substance Use Topics   Alcohol use: Yes    Alcohol/week: 0.0 standard drinks of alcohol    Comment: social   Drug use: Not Currently   Social History   Socioeconomic History   Marital status: Single    Spouse name: Not on file   Number of children: 1   Years of education: Not on file  Highest education level: Bachelor's degree (e.g., BA, AB, BS)  Occupational History   Occupation:  call center   Occupation: Stay at home  Tobacco Use   Smoking status: Former    Types: Cigarettes   Smokeless tobacco: Never  Vaping Use   Vaping status: Never Used  Substance and Sexual Activity   Alcohol use: Yes    Alcohol/week: 0.0 standard drinks of alcohol    Comment: social   Drug use: Not Currently   Sexual activity: Yes    Birth control/protection: I.U.D.  Other Topics  Concern   Not on file  Social History Narrative   Exercise ---  5 days a week for 40 min   Social Drivers of Health   Financial Resource Strain: Low Risk  (03/28/2023)   Overall Financial Resource Strain (CARDIA)    Difficulty of Paying Living Expenses: Not very hard  Food Insecurity: No Food Insecurity (03/28/2023)   Hunger Vital Sign    Worried About Running Out of Food in the Last Year: Never true    Ran Out of Food in the Last Year: Never true  Transportation Needs: No Transportation Needs (03/28/2023)   PRAPARE - Administrator, Civil Service (Medical): No    Lack of Transportation (Non-Medical): No  Physical Activity: Unknown (03/28/2023)   Exercise Vital Sign    Days of Exercise per Week: 0 days    Minutes of Exercise per Session: Not on file  Stress: Stress Concern Present (03/28/2023)   [employer] of Occupational Health - Occupational Stress Questionnaire    Feeling of Stress : Very much  Social Connections: Moderately Integrated (03/28/2023)   Social Connection and Isolation Panel [NHANES]    Frequency of Communication with Friends and Family: More than three times a week    Frequency of Social Gatherings with Friends and Family: Never    Attends Religious Services: More than 4 times per year    Active Member of [employer] or Organizations: No    Attends Engineer, structural: More than 4 times per year    Marital Status: Never married  Catering manager Violence: Not on file   Family Status  Relation Name Status   Mother  Alive   Father  Alive   Sister  (Not Specified)   MGM  (Not Specified)   MGF  (Not Specified)   PGM  Alive  No partnership data on file   Family History  Problem Relation Age of Onset   Hypertension Mother    Obesity Mother    Depression Mother    Hypertension Father    Depression Father    Sleep apnea Father    Drug abuse Father    Obesity Father    Cancer Father    Alcohol abuse Sister    Stroke Sister    Heart  disease Maternal Grandmother    Alcohol abuse Maternal Grandfather    Arthritis Paternal Grandmother    Cancer Paternal Grandmother        breast   No Known Allergies    ROS    Objective:     BP 124/80 (BP Location: Right Arm, Patient Position: Sitting)   Pulse 91   Temp 98.2 F (36.8 C) (Oral)   Resp 18   Ht 5\' 6"  (1.676 m)   Wt 254 lb 3.2 oz (115.3 kg)   SpO2 98%   BMI 41.03 kg/m  BP Readings from Last 3 Encounters:  03/29/23 124/80  03/23/23 (!) 160/102  01/12/23  126/78   Wt Readings from Last 3 Encounters:  03/29/23 254 lb 3.2 oz (115.3 kg)  03/23/23 252 lb (114.3 kg)  01/12/23 252 lb (114.3 kg)   SpO2 Readings from Last 3 Encounters:  03/29/23 98%  01/12/23 97%  01/07/23 98%      Physical Exam Vitals and nursing note reviewed.  Constitutional:      General: She is not in acute distress.    Appearance: Normal appearance. She is well-developed.  HENT:     Head: Normocephalic and atraumatic.  Eyes:     General: No scleral icterus.       Right eye: No discharge.        Left eye: No discharge.  Cardiovascular:     Rate and Rhythm: Normal rate and regular rhythm.     Heart sounds: No murmur heard. Pulmonary:     Effort: Pulmonary effort is normal. No respiratory distress.     Breath sounds: Normal breath sounds.  Musculoskeletal:        General: Tenderness present. Normal range of motion.     Cervical back: Normal range of motion and neck supple.     Right lower leg: No edema.     Left lower leg: No edema.  Skin:    General: Skin is warm and dry.  Neurological:     Mental Status: She is alert and oriented to person, place, and time.     Sensory: Sensory deficit present.     Motor: Weakness present.     Coordination: Coordination normal.     Gait: Gait abnormal.     Deep Tendon Reflexes: Reflexes abnormal.     Reflex Scores:      Tricep reflexes are 2+ on the right side and 2+ on the left side.      Bicep reflexes are 2+ on the right side and  2+ on the left side.      Brachioradialis reflexes are 2+ on the right side and 2+ on the left side.      Patellar reflexes are 2+ on the right side and 0 on the left side.      Achilles reflexes are 2+ on the right side and 2+ on the left side. Psychiatric:        Mood and Affect: Mood normal.        Behavior: Behavior normal.        Thought Content: Thought content normal.        Judgment: Judgment normal.      No results found for any visits on 03/29/23.  Last CBC Lab Results  Component Value Date   WBC 9.8 06/29/2022   HGB 14.5 06/29/2022   HCT 42.9 06/29/2022   MCV 90.5 06/29/2022   MCH 33.0 08/27/2020   RDW 13.4 06/29/2022   PLT 322.0 06/29/2022   Last metabolic panel Lab Results  Component Value Date   GLUCOSE 84 10/28/2022   NA 142 10/28/2022   K 4.6 10/28/2022   CL 104 10/28/2022   CO2 22 10/28/2022   BUN 13 10/28/2022   CREATININE 0.79 10/28/2022   EGFR 95 10/28/2022   CALCIUM 9.1 10/28/2022   PROT 7.0 10/28/2022   ALBUMIN 4.4 10/28/2022   LABGLOB 2.6 10/28/2022   AGRATIO 1.3 09/04/2020   BILITOT 0.8 10/28/2022   ALKPHOS 79 10/28/2022   AST 17 10/28/2022   ALT 19 10/28/2022   ANIONGAP 15 08/27/2020   Last lipids Lab Results  Component Value Date   CHOL 173  05/18/2022   HDL 61.70 05/18/2022   LDLCALC 86 05/18/2022   TRIG 127.0 05/18/2022   CHOLHDL 3 05/18/2022   Last hemoglobin A1c Lab Results  Component Value Date   HGBA1C 5.7 (H) 10/28/2022   Last thyroid functions Lab Results  Component Value Date   TSH 1.38 05/18/2022   T3TOTAL 149 03/17/2017   T4TOTAL 7.6 08/12/2021   Last vitamin D Lab Results  Component Value Date   VD25OH 17.44 (L) 05/18/2022   Last vitamin B12 and Folate Lab Results  Component Value Date   VITAMINB12 307 05/18/2022   FOLATE 17.6 03/17/2017      The 10-year ASCVD risk score (Arnett DK, et al., 2019) is: 0.7%    Assessment & Plan:   Problem List Items Addressed This Visit       Unprioritized    Low back pain - Primary   Relevant Medications   cyclobenzaprine (FLEXERIL) 10 MG tablet  Assessment and Plan    Acute Low Back Pain with Sciatica Presents with acute low back pain radiating to the left hip and previously down the leg to the toes, following a lifting injury on March 20, 2023. Pain has been severe, affecting mobility and daily activities, with some improvement on methocarbamol and meloxicam. Reports muscle spasms and difficulty sleeping. Pain worsened after a sneeze but has since improved slightly. Currently unable to perform work duties involving physical labor. Discussed risks and benefits of current medications and potential use of prednisone. Advised against back brace to avoid muscle weakening. Surgery is not currently indicated but would involve a brace post-surgery if necessary. Order lumbar spine x-rays. Prescribe cyclobenzaprine (Flexeril) for nighttime use. Continue methocarbamol during the day and meloxicam 15 mg once daily. Advise against returning to work until further improvement. Provide a printed note for work accommodations.  General Health Maintenance Educated on the importance of strengthening back muscles and potential use of a brace post-surgery if necessary. Advise against the use of a back brace.  Follow-up Follow up if significant improvement is noted and work clearance is needed. Reassess work capability by Thursday, April 01, 2023.        Return if symptoms worsen or fail to improve.    Donato Schultz, DO

## 2023-03-30 DIAGNOSIS — Z630 Problems in relationship with spouse or partner: Secondary | ICD-10-CM | POA: Diagnosis not present

## 2023-03-30 DIAGNOSIS — F4321 Adjustment disorder with depressed mood: Secondary | ICD-10-CM | POA: Diagnosis not present

## 2023-04-01 ENCOUNTER — Ambulatory Visit: Payer: Medicaid Other | Admitting: Primary Care

## 2023-04-05 ENCOUNTER — Ambulatory Visit: Payer: Medicaid Other | Admitting: Family Medicine

## 2023-04-05 ENCOUNTER — Encounter: Payer: Self-pay | Admitting: Family Medicine

## 2023-04-05 VITALS — BP 134/82 | HR 92 | Ht 66.0 in | Wt 255.4 lb

## 2023-04-05 DIAGNOSIS — M545 Low back pain, unspecified: Secondary | ICD-10-CM

## 2023-04-05 DIAGNOSIS — Z1211 Encounter for screening for malignant neoplasm of colon: Secondary | ICD-10-CM

## 2023-04-05 MED ORDER — METHOCARBAMOL 500 MG PO TABS: 500.0000 mg | ORAL_TABLET | Freq: Three times a day (TID) | ORAL | 2 refills | Status: DC | PRN

## 2023-04-05 NOTE — Patient Instructions (Signed)

## 2023-04-05 NOTE — Progress Notes (Signed)
Established Patient Office Visit  Subjective   Patient ID: Jody Bryan, female    DOB: 10/26/1980  Age: 43 y.o. MRN: [id number]  Chief Complaint  Patient presents with   Follow-up    Pt states her lower back feels better, however pt states she has a pain in her L leg Pt states it feels like a constant "charlie horse"     HPI Discussed the use of AI scribe software for clinical note transcription with the patient, who gave verbal consent to proceed.  History of Present Illness   The patient is a 43 year old who presents with persistent leg pain and numbness.  They have persistent pain originating from the buttock and radiating down the left leg, described as a constant burning pain with numbness. This pain has been present since March 22, 2023, following an incident where they overextended themselves lifting a trash can. No recent falls, fever, or other systemic symptoms. Walking is generally beneficial, but they need to sit after walking for a period of time.  X-rays have shown arthritic changes. They have been seeing a chiropractor since March 23, 2023, with weekly visits for three weeks, focusing on alignment and traction. They have not yet started physical therapy recently but have done it in the past for a different issue.  They are currently taking meloxicam once a day and methocarbamol during the day, ensuring it is at least eight hours apart from their nighttime medication. The nighttime medication helps them sleep, but the daytime medication does not have the same effect. They have concerns about undergoing an MRI due to the discomfort of remaining in one position and have not received a call to schedule it yet.  They work in maintenance, performing tasks such as clocking, installing sinks, and countertops. They express concerns about their ability to perform these tasks due to their back pain.      Patient Active Problem List   Diagnosis Date Noted   Snoring  02/01/2023   Hx gestational diabetes 10/28/2022   Suspected sleep apnea 10/28/2022   Abnormal metabolism 10/28/2022   Leukocytosis 06/29/2022   Urinary retention 05/28/2022   Allergic conjunctivitis of both eyes 05/28/2022   Urinary frequency 05/28/2022   Contraception, device intrauterine 05/18/2022   Vitamin B12 deficiency 05/18/2022   Inguinal pain 08/12/2021   Class 3 severe obesity with serious comorbidity and body mass index (BMI) of 40.0 to 44.9 in adult (HCC) 07/11/2021   De Quervain's tenosynovitis, bilateral 01/17/2021   Renal cyst 12/22/2019   Bulging lumbar disc 12/22/2019   Preventative health care 08/09/2017   Vitamin D deficiency 04/01/2017   Prediabetes 04/01/2017   Other fatigue 03/17/2017   Low back pain with radiation 07/23/2014   Right wrist pain 09/05/2012   Meralgia paraesthetica 05/25/2012   Past Medical History:  Diagnosis Date   Bilateral leg edema    Chicken pox    Depression    Drug use    Dyspnea    GERD (gastroesophageal reflux disease)    Low back pain    Past Surgical History:  Procedure Laterality Date   WISDOM TOOTH EXTRACTION  08/2019   Social History   Tobacco Use   Smoking status: Former    Types: Cigarettes   Smokeless tobacco: Never  Vaping Use   Vaping status: Never Used  Substance Use Topics   Alcohol use: Yes    Alcohol/week: 0.0 standard drinks of alcohol    Comment: social   Drug use: Not Currently  Social History   Socioeconomic History   Marital status: Single    Spouse name: Not on file   Number of children: 1   Years of education: Not on file   Highest education level: Bachelor's degree (e.g., BA, AB, BS)  Occupational History   Occupation:  call center   Occupation: Stay at home  Tobacco Use   Smoking status: Former    Types: Cigarettes   Smokeless tobacco: Never  Vaping Use   Vaping status: Never Used  Substance and Sexual Activity   Alcohol use: Yes    Alcohol/week: 0.0 standard drinks of alcohol     Comment: social   Drug use: Not Currently   Sexual activity: Yes    Birth control/protection: I.U.D.  Other Topics Concern   Not on file  Social History Narrative   Exercise ---  5 days a week for 40 min   Social Drivers of Health   Financial Resource Strain: Low Risk  (03/28/2023)   Overall Financial Resource Strain (CARDIA)    Difficulty of Paying Living Expenses: Not very hard  Food Insecurity: No Food Insecurity (03/28/2023)   Hunger Vital Sign    Worried About Running Out of Food in the Last Year: Never true    Ran Out of Food in the Last Year: Never true  Transportation Needs: No Transportation Needs (03/28/2023)   PRAPARE - Administrator, Civil Service (Medical): No    Lack of Transportation (Non-Medical): No  Physical Activity: Unknown (03/28/2023)   Exercise Vital Sign    Days of Exercise per Week: 0 days    Minutes of Exercise per Session: Not on file  Stress: Stress Concern Present (03/28/2023)   [employer] of Occupational Health - Occupational Stress Questionnaire    Feeling of Stress : Very much  Social Connections: Moderately Integrated (03/28/2023)   Social Connection and Isolation Panel [NHANES]    Frequency of Communication with Friends and Family: More than three times a week    Frequency of Social Gatherings with Friends and Family: Never    Attends Religious Services: More than 4 times per year    Active Member of [employer] or Organizations: No    Attends Engineer, structural: More than 4 times per year    Marital Status: Never married  Catering manager Violence: Not on file   Family Status  Relation Name Status   Mother  Alive   Father  Alive   Sister  (Not Specified)   MGM  (Not Specified)   MGF  (Not Specified)   PGM  Alive  No partnership data on file   Family History  Problem Relation Age of Onset   Hypertension Mother    Obesity Mother    Depression Mother    Hypertension Father    Depression Father    Sleep apnea  Father    Drug abuse Father    Obesity Father    Cancer Father    Alcohol abuse Sister    Stroke Sister    Heart disease Maternal Grandmother    Alcohol abuse Maternal Grandfather    Arthritis Paternal Grandmother    Cancer Paternal Grandmother        breast   No Known Allergies    Review of Systems  Constitutional:  Negative for fever and malaise/fatigue.  HENT:  Negative for congestion.   Eyes:  Negative for blurred vision.  Respiratory:  Negative for cough and shortness of breath.   Cardiovascular:  Negative  for chest pain, palpitations and leg swelling.  Gastrointestinal:  Negative for vomiting.  Musculoskeletal:  Negative for back pain.  Skin:  Negative for rash.  Neurological:  Negative for loss of consciousness and headaches.      Objective:     BP 134/82 (BP Location: Left Arm, Patient Position: Sitting, Cuff Size: Large)   Pulse 92   Ht 5\' 6"  (1.676 m)   Wt 255 lb 6.4 oz (115.8 kg)   SpO2 97%   BMI 41.22 kg/m  BP Readings from Last 3 Encounters:  04/05/23 134/82  03/29/23 124/80  03/23/23 (!) 160/102   Wt Readings from Last 3 Encounters:  04/05/23 255 lb 6.4 oz (115.8 kg)  03/29/23 254 lb 3.2 oz (115.3 kg)  03/23/23 252 lb (114.3 kg)   SpO2 Readings from Last 3 Encounters:  04/05/23 97%  03/29/23 98%  01/12/23 97%      Physical Exam Vitals and nursing note reviewed.  Constitutional:      General: She is not in acute distress.    Appearance: Normal appearance. She is well-developed.  HENT:     Head: Normocephalic and atraumatic.  Eyes:     General: No scleral icterus.       Right eye: No discharge.        Left eye: No discharge.  Cardiovascular:     Rate and Rhythm: Normal rate and regular rhythm.     Heart sounds: No murmur heard. Pulmonary:     Effort: Pulmonary effort is normal. No respiratory distress.     Breath sounds: Normal breath sounds.  Musculoskeletal:        General: Tenderness present. Normal range of motion.     Cervical  back: Normal range of motion and neck supple.     Right lower leg: No edema.     Left lower leg: No edema.  Skin:    General: Skin is warm and dry.  Neurological:     Mental Status: She is alert and oriented to person, place, and time.     Motor: Weakness present.  Psychiatric:        Mood and Affect: Mood normal.        Behavior: Behavior normal.        Thought Content: Thought content normal.        Judgment: Judgment normal.      No results found for any visits on 04/05/23.  Last CBC Lab Results  Component Value Date   WBC 9.8 06/29/2022   HGB 14.5 06/29/2022   HCT 42.9 06/29/2022   MCV 90.5 06/29/2022   MCH 33.0 08/27/2020   RDW 13.4 06/29/2022   PLT 322.0 06/29/2022   Last metabolic panel Lab Results  Component Value Date   GLUCOSE 84 10/28/2022   NA 142 10/28/2022   K 4.6 10/28/2022   CL 104 10/28/2022   CO2 22 10/28/2022   BUN 13 10/28/2022   CREATININE 0.79 10/28/2022   EGFR 95 10/28/2022   CALCIUM 9.1 10/28/2022   PROT 7.0 10/28/2022   ALBUMIN 4.4 10/28/2022   LABGLOB 2.6 10/28/2022   AGRATIO 1.3 09/04/2020   BILITOT 0.8 10/28/2022   ALKPHOS 79 10/28/2022   AST 17 10/28/2022   ALT 19 10/28/2022   ANIONGAP 15 08/27/2020   Last lipids Lab Results  Component Value Date   CHOL 173 05/18/2022   HDL 61.70 05/18/2022   LDLCALC 86 05/18/2022   TRIG 127.0 05/18/2022   CHOLHDL 3 05/18/2022   Last hemoglobin A1c  Lab Results  Component Value Date   HGBA1C 5.7 (H) 10/28/2022   Last thyroid functions Lab Results  Component Value Date   TSH 1.38 05/18/2022   T3TOTAL 149 03/17/2017   T4TOTAL 7.6 08/12/2021   Last vitamin D Lab Results  Component Value Date   VD25OH 17.44 (L) 05/18/2022   Last vitamin B12 and Folate Lab Results  Component Value Date   VITAMINB12 307 05/18/2022   FOLATE 17.6 03/17/2017      The 10-year ASCVD risk score (Arnett DK, et al., 2019) is: 0.9%    Assessment & Plan:   Problem List Items Addressed This Visit        Unprioritized   Low back pain with radiation - Primary   Relevant Medications   methocarbamol (ROBAXIN) 500 MG tablet   Other Relevant Orders   Ambulatory referral to Physical Therapy   Other Visit Diagnoses       Colon cancer screening       Relevant Orders   Ambulatory referral to Gastroenterology     Assessment and Plan    Sciatica Persistent pain radiates from the left buttock down the left leg, described as an ache with numbness and burning. Symptoms began on January 20th after lifting a heavy trash can, likely due to nerve impingement from arthritis or muscle issues. Minimal improvement noted with three weeks of chiropractic care. An MRI is needed for further evaluation but requires six weeks of physical therapy first. Physical therapy is necessary to meet insurance requirements for the MRI, alleviate symptoms, and improve function. Avoid heavy lifting and prolonged positions to prevent exacerbation. Refer to physical therapy for at least three weeks. Schedule an MRI after six weeks of physical therapy. Avoid lifting more than 10 pounds. Alternate sitting and standing to avoid prolonged positions. Use ice packs for pain relief. Prescribe methocarbamol for daytime use and refill meloxicam for once-daily use.  Arthritis X-rays show arthritic changes, with pain and stiffness possibly contributing to sciatica symptoms. Arthritis can cause nerve impingement leading to sciatica. Current medications aim to reduce inflammation and pain. Continue current medications, meloxicam and methocarbamol. Monitor symptoms and adjust treatment as needed.  General Health Maintenance Declined flu shot despite discussion on the importance of flu vaccination due to high ER visits for flu. Due for a colonoscopy at 43 years old. Encourage flu vaccination and recommend scheduling a colonoscopy.  Follow-up Follow up with physical therapy. Schedule an MRI after six weeks of physical therapy. Return for a  follow-up visit after MRI results.        No follow-ups on file.    Donato Schultz, DO

## 2023-04-05 NOTE — Assessment & Plan Note (Signed)
Improved but still has numbness down leg Pain with extension L low leg

## 2023-04-08 ENCOUNTER — Ambulatory Visit: Payer: Medicaid Other | Attending: Family Medicine | Admitting: Physical Therapy

## 2023-04-08 ENCOUNTER — Other Ambulatory Visit: Payer: Self-pay

## 2023-04-08 DIAGNOSIS — M5442 Lumbago with sciatica, left side: Secondary | ICD-10-CM | POA: Diagnosis not present

## 2023-04-08 DIAGNOSIS — M545 Low back pain, unspecified: Secondary | ICD-10-CM | POA: Diagnosis not present

## 2023-04-08 DIAGNOSIS — R252 Cramp and spasm: Secondary | ICD-10-CM | POA: Insufficient documentation

## 2023-04-08 DIAGNOSIS — M6281 Muscle weakness (generalized): Secondary | ICD-10-CM | POA: Insufficient documentation

## 2023-04-08 NOTE — Therapy (Signed)
 OUTPATIENT PHYSICAL THERAPY THORACOLUMBAR EVALUATION   Patient Name: Jody Bryan MRN: [id number] DOB:10/19/1980, 43 y.o., female Today's Date: 04/08/2023  END OF SESSION:  PT End of Session - 04/08/23 0815     Visit Number 1    Date for PT Re-Evaluation 05/20/23    Authorization Type [place] Medicaid    Authorization Time Period pending authorization    PT Start Time 0810    PT Stop Time 0845    PT Time Calculation (min) 35 min    Activity Tolerance Patient tolerated treatment well    Behavior During Therapy WFL for tasks assessed/performed             Past Medical History:  Diagnosis Date   Bilateral leg edema    Chicken pox    Depression    Drug use    Dyspnea    GERD (gastroesophageal reflux disease)    Low back pain    Past Surgical History:  Procedure Laterality Date   WISDOM TOOTH EXTRACTION  08/2019   Patient Active Problem List   Diagnosis Date Noted   Snoring 02/01/2023   Hx gestational diabetes 10/28/2022   Suspected sleep apnea 10/28/2022   Abnormal metabolism 10/28/2022   Leukocytosis 06/29/2022   Urinary retention 05/28/2022   Allergic conjunctivitis of both eyes 05/28/2022   Urinary frequency 05/28/2022   Contraception, device intrauterine 05/18/2022   Vitamin B12 deficiency 05/18/2022   Inguinal pain 08/12/2021   Class 3 severe obesity with serious comorbidity and body mass index (BMI) of 40.0 to 44.9 in adult (HCC) 07/11/2021   De Quervain's tenosynovitis, bilateral 01/17/2021   Renal cyst 12/22/2019   Bulging lumbar disc 12/22/2019   Preventative health care 08/09/2017   Vitamin D  deficiency 04/01/2017   Prediabetes 04/01/2017   Other fatigue 03/17/2017   Low back pain with radiation 07/23/2014   Right wrist pain 09/05/2012   Meralgia paraesthetica 05/25/2012    PCP: Antonio Cyndee Jamee JONELLE, DO   REFERRING PROVIDER: Antonio Cyndee Jamee JONELLE, *   REFERRING DIAG: M54.50 (ICD-10-CM) - Low back pain with radiation   Rationale for  Evaluation and Treatment: Rehabilitation  THERAPY DIAG:  Acute left-sided low back pain with left-sided sciatica  Cramp and spasm  Muscle weakness (generalized)  ONSET DATE: ~03/20/2023  SUBJECTIVE:                                                                                                                                                                                           SUBJECTIVE STATEMENT: I was lifting trashcan at work and felt pain in back, that's gotten better but now have pain shooting down L  side from butt all the way ankle.   Sometimes when sitting feels like toes goes numb, but not when standing.   Jesus been seeing a chiropractor all last year, bit ran out of services last October, started up again in January, he told me to come here 3 times because it feels more like tissue issue than bone issue.   PERTINENT HISTORY:  From MD notes: Persistent pain radiates from the left buttock down the left leg, described as an ache with numbness and burning. Symptoms began on January 20th after lifting a heavy trash can, likely due to nerve impingement from arthritis or muscle issues. Minimal improvement noted with three weeks of chiropractic care. An MRI is needed for further evaluation but requires six weeks of physical therapy first. Physical therapy is necessary to meet insurance requirements for the MRI, alleviate symptoms, and improve function. Avoid heavy lifting and prolonged positions to prevent exacerbation. Refer to physical therapy for at least three weeks. Schedule an MRI after six weeks of physical therapy. Avoid lifting more than 10 pounds. Alternate sitting and standing to avoid prolonged positions. Use ice packs for pain relief. Prescribe methocarbamol  for daytime use and refill meloxicam  for once-daily use.  PMH:  LBP, GERD, PreDiabetes, obesity PAIN:  Are you having pain? Yes: NPRS scale: 6/10 Pain location: low back left side down LLE Pain description: dull, sharp,  radiating, annoying, toes go numb Aggravating factors: sitting, driving Relieving factors: standing  PRECAUTIONS: None  RED FLAGS: None   WEIGHT BEARING RESTRICTIONS: No  FALLS:  Has patient fallen in last 6 months? No  LIVING ENVIRONMENT: Lives with: lives with their family Lives in: House/apartment Stairs: Yes: External: 2 steps; on left going up Has following equipment at home: None  OCCUPATION: [employer]  PLOF: Independent has young children  PATIENT GOALS: get rid of pain  NEXT MD VISIT: not scheduled yet  OBJECTIVE:   DIAGNOSTIC FINDINGS:   MRI ordered  DG lumbar spine 03/29/2023 FINDINGS: Five non-rib-bearing lumbar vertebra. Normal alignment. Vertebral body heights are normal. No fracture or compression deformity. Mild L4-L5 and L5-S1 disc space narrowing and spurring with progression from prior exam. Minor multilevel facet hypertrophy. No visible pars defects or focal bone abnormalities. The sacroiliac joints are congruent. IUD in the pelvis.   IMPRESSION: 1. Progressive degenerative disc disease at L4-L5 and L5-S1 from 2018. 2. Mild multilevel facet hypertrophy.  PATIENT SURVEYS:  Modified Oswestry 26/50   COGNITION: Overall cognitive status: Within functional limits for tasks assessed     SENSATION: Light touch: Impaired   L4/5 dermatomes  MUSCLE LENGTH: Hamstrings: Right 90 deg; Left 60 deg with increased neural tension and pain in low back  POSTURE: rounded shoulders and forward head  PALPATION: Tenderness/tightness L lumbar paraspinals, L SIJ, L glutes/piriformis  LUMBAR ROM:   AROM eval  Flexion To knees, p!  Extension WNL, p! L side  Right lateral flexion To knees, pulls  Left lateral flexion To knees, pinches L side  Right rotation WNL  Left rotation WNL   (Blank rows = not tested)  LOWER EXTREMITY ROM:      LOWER EXTREMITY MMT:    MMT Right eval Left eval  Hip flexion 5 5  Hip extension    Hip abduction 5 5   Hip adduction 5 5  Knee flexion 5 4  Knee extension 5 4  Ankle dorsiflexion 5 4  Ankle plantarflexion 5 5   (Blank rows = not tested)  LUMBAR SPECIAL TESTS:  Straight leg raise test: Positive  FUNCTIONAL TESTS:  5 times sit to stand: 18.2 seconds  GAIT: Distance walked: 40' Assistive device utilized: None Level of assistance: Complete Independence Comments: no significant deviation  TODAY'S TREATMENT:                                                                                                                              DATE:   04/08/23 EVAL Neuromuscular Reeducation: to centralize symptoms and decrease neural tension Prone lying to progress to prone on elbows as tolerated.  Stop if symptoms worsen down leg.      PATIENT EDUCATION:  Education details: findings, POC, initial HEP Person educated: Patient Education method: Explanation, Demonstration, and Handouts Education comprehension: verbalized understanding and returned demonstration  HOME EXERCISE PROGRAM: Access Code: [email or web address] URL: [email or web address] Date: 04/08/2023 Prepared by: Almarie Sprinkles  Exercises - Lying Prone  - 1 x daily - 7 x weekly - 1 sets - 1 reps - 3-5 min hold - Static Prone on Elbows  - 1 x daily - 7 x weekly - 1 sets - 1 reps  ASSESSMENT:  CLINICAL IMPRESSION: Jody Bryan  is a 43 y.o. female who was seen today for physical therapy evaluation and treatment for low back pain radiating down LLE.    She demonstrates decreased sensation in L L4/5 dermatomes and weakness in L4/5 myotomes consistent with Left L4/5 radiculopathy with positive straight leg raise.  Also noted tenderness in L lumbar multifidi, erector spinae, QL, L SIJ and piriformis. Given initial  HEP for prone extension to progress to POE as tolerated.  Mahogany Ortman would benefit from skilled physical therapy to decrease LBP, centralize symptoms and improve activity tolerance.    OBJECTIVE IMPAIRMENTS:  decreased activity tolerance, decreased strength, increased fascial restrictions, increased muscle spasms, impaired sensation, and pain.   ACTIVITY LIMITATIONS: carrying, lifting, bending, sitting, standing, squatting, sleeping, stairs, and locomotion level  PARTICIPATION LIMITATIONS: meal prep, cleaning, laundry, driving, shopping, community activity, and occupation  PERSONAL FACTORS: Past/current experiences, Time since onset of injury/illness/exacerbation, and 1-2 comorbidities: previous history of LBP, GERD, PreDiabetes, obesity  are also affecting patient's functional outcome.   REHAB POTENTIAL: Good  CLINICAL DECISION MAKING: Evolving/moderate complexity  EVALUATION COMPLEXITY: Moderate   GOALS: Goals reviewed with patient? Yes  SHORT TERM GOALS: Target date: 04/22/2023   Patient will be independent with initial HEP.  Baseline: given Goal status: INITIAL  2.   Patient will report centralization of radicular symptoms.  Baseline: numbness L toes Goal status: INITIAL   LONG TERM GOALS: Target date: 05/20/2023   Patient will be independent with advanced/ongoing HEP to improve outcomes and carryover.  Baseline:  Goal status: INITIAL  2.  Patient will report 75% improvement in low back pain to improve QOL.  Baseline:  Goal status: INITIAL  3.  Patient will demonstrate full pain free lumbar ROM to perform ADLs.   Baseline: see objective Goal status: INITIAL  4.  Patient will  demonstrate improved functional strength as demonstrated by 5/5 LLE all myotomes. Baseline: see objective Goal status: INITIAL  5.  Patient will report at least 6 points improvement on modified Oswestry to demonstrate improved functional ability.  Baseline: 26/50 Goal status: INITIAL   6.  Patient will tolerate 60 min of sitting without increased pain to eat/drive. Baseline: limited to 30 min max, toes go numb Goal status: INITIAL  PLAN:  PT FREQUENCY: 2x/week  PT DURATION: 6  weeks  PLANNED INTERVENTIONS: 97110-Therapeutic exercises, 97530- Therapeutic activity, 97112- Neuromuscular re-education, 97535- Self Care, [id number]- Manual therapy, [id number]- Electrical stimulation (unattended), [id number]- Electrical stimulation (manual), [id number]- Ultrasound, [id number]- Traction (mechanical), Patient/Family education, Dry Needling, Joint mobilization, Joint manipulation, Spinal manipulation, Spinal mobilization, Cryotherapy, and Moist heat.  PLAN FOR NEXT SESSION: start McKenzie extension program exercises, manual therapy, TrDN, traction may be option as well.     Almarie JINNY Sprinkles, PT 04/08/2023, 5:38 PM

## 2023-04-12 ENCOUNTER — Encounter: Payer: Self-pay | Admitting: Physical Therapy

## 2023-04-12 ENCOUNTER — Ambulatory Visit: Payer: Medicaid Other | Admitting: Physical Therapy

## 2023-04-12 DIAGNOSIS — R252 Cramp and spasm: Secondary | ICD-10-CM

## 2023-04-12 DIAGNOSIS — M5442 Lumbago with sciatica, left side: Secondary | ICD-10-CM | POA: Diagnosis not present

## 2023-04-12 DIAGNOSIS — M6281 Muscle weakness (generalized): Secondary | ICD-10-CM

## 2023-04-12 DIAGNOSIS — M545 Low back pain, unspecified: Secondary | ICD-10-CM | POA: Diagnosis not present

## 2023-04-12 NOTE — Therapy (Signed)
 OUTPATIENT PHYSICAL THERAPY TREATMENT   Patient Name: Jody Bryan MRN: [id number] DOB:1981/02/21, 43 y.o., female Today's Date: 04/12/2023  END OF SESSION:  PT End of Session - 04/12/23 0806     Visit Number 2    Date for PT Re-Evaluation 05/20/23    Authorization Type [place] Medicaid    Authorization Time Period pending authorization    PT Start Time 0806    PT Stop Time 0845    PT Time Calculation (min) 39 min    Activity Tolerance Patient tolerated treatment well    Behavior During Therapy WFL for tasks assessed/performed             Past Medical History:  Diagnosis Date   Bilateral leg edema    Chicken pox    Depression    Drug use    Dyspnea    GERD (gastroesophageal reflux disease)    Low back pain    Past Surgical History:  Procedure Laterality Date   WISDOM TOOTH EXTRACTION  08/2019   Patient Active Problem List   Diagnosis Date Noted   Snoring 02/01/2023   Hx gestational diabetes 10/28/2022   Suspected sleep apnea 10/28/2022   Abnormal metabolism 10/28/2022   Leukocytosis 06/29/2022   Urinary retention 05/28/2022   Allergic conjunctivitis of both eyes 05/28/2022   Urinary frequency 05/28/2022   Contraception, device intrauterine 05/18/2022   Vitamin B12 deficiency 05/18/2022   Inguinal pain 08/12/2021   Class 3 severe obesity with serious comorbidity and body mass index (BMI) of 40.0 to 44.9 in adult (HCC) 07/11/2021   De Quervain's tenosynovitis, bilateral 01/17/2021   Renal cyst 12/22/2019   Bulging lumbar disc 12/22/2019   Preventative health care 08/09/2017   Vitamin D  deficiency 04/01/2017   Prediabetes 04/01/2017   Other fatigue 03/17/2017   Low back pain with radiation 07/23/2014   Right wrist pain 09/05/2012   Meralgia paraesthetica 05/25/2012    PCP: Estill Hemming, DO   REFERRING PROVIDER: Estill Hemming, *   REFERRING DIAG: M54.50 (ICD-10-CM) - Low back pain with radiation   Rationale for Evaluation and  Treatment: Rehabilitation  THERAPY DIAG:  Acute left-sided low back pain with left-sided sciatica  Cramp and spasm  Muscle weakness (generalized)  ONSET DATE: ~03/20/2023  SUBJECTIVE:                                                                                                                                                                                           SUBJECTIVE STATEMENT: 04/12/2023  Back is still not a problem, its in the thigh, but if I bend over to do something or put  on a sock, that's when I feel it.  Laying prone felt good on kids bed - firmer bed.   I was lifting trashcan at work and felt pain in back, that's gotten better but now have pain shooting down L side from butt all the way ankle.   Sometimes when sitting feels like toes goes numb, but not when standing.   Jullie Oiler been seeing a chiropractor all last year, bit ran out of services last October, started up again in January, he told me to come here 3 times because it feels more like tissue issue than bone issue.   PERTINENT HISTORY:  From MD notes: Persistent pain radiates from the left buttock down the left leg, described as an ache with numbness and burning. Symptoms began on January 20th after lifting a heavy trash can, likely due to nerve impingement from arthritis or muscle issues. Minimal improvement noted with three weeks of chiropractic care. An MRI is needed for further evaluation but requires six weeks of physical therapy first. Physical therapy is necessary to meet insurance requirements for the MRI, alleviate symptoms, and improve function. Avoid heavy lifting and prolonged positions to prevent exacerbation. Refer to physical therapy for at least three weeks. Schedule an MRI after six weeks of physical therapy. Avoid lifting more than 10 pounds. Alternate sitting and standing to avoid prolonged positions. Use ice packs for pain relief. Prescribe methocarbamol  for daytime use and refill meloxicam  for once-daily  use.  PMH:  LBP, GERD, PreDiabetes, obesity PAIN:  Are you having pain? Yes: NPRS scale: 7/10 Pain location: low back left side down LLE Pain description: dull, sharp, radiating, annoying, toes go numb Aggravating factors: sitting, driving Relieving factors: standing  PRECAUTIONS: None  RED FLAGS: None   WEIGHT BEARING RESTRICTIONS: No  FALLS:  Has patient fallen in last 6 months? No  LIVING ENVIRONMENT: Lives with: lives with their family Lives in: House/apartment Stairs: Yes: External: 2 steps; on left going up Has following equipment at home: None  OCCUPATION: [employer]  PLOF: Independent has young children  PATIENT GOALS: get rid of pain  NEXT MD VISIT: not scheduled yet  OBJECTIVE:   DIAGNOSTIC FINDINGS:   MRI ordered  DG lumbar spine 03/29/2023 FINDINGS: Five non-rib-bearing lumbar vertebra. Normal alignment. Vertebral body heights are normal. No fracture or compression deformity. Mild L4-L5 and L5-S1 disc space narrowing and spurring with progression from prior exam. Minor multilevel facet hypertrophy. No visible pars defects or focal bone abnormalities. The sacroiliac joints are congruent. IUD in the pelvis.   IMPRESSION: 1. Progressive degenerative disc disease at L4-L5 and L5-S1 from 2018. 2. Mild multilevel facet hypertrophy.  PATIENT SURVEYS:  Modified Oswestry 26/50   COGNITION: Overall cognitive status: Within functional limits for tasks assessed     SENSATION: Light touch: Impaired   L4/5 dermatomes  MUSCLE LENGTH: Hamstrings: Right 90 deg; Left 60 deg with increased neural tension and pain in low back  POSTURE: rounded shoulders and forward head  PALPATION: Tenderness/tightness L lumbar paraspinals, L SIJ, L glutes/piriformis  LUMBAR ROM:   AROM eval  Flexion To knees, p!  Extension WNL, p! L side  Right lateral flexion To knees, pulls  Left lateral flexion To knees, pinches L side  Right rotation WNL  Left rotation  WNL   (Blank rows = not tested)  LOWER EXTREMITY ROM:      LOWER EXTREMITY MMT:    MMT Right eval Left eval  Hip flexion 5 5  Hip extension  Hip abduction 5 5  Hip adduction 5 5  Knee flexion 5 4  Knee extension 5 4  Ankle dorsiflexion 5 4  Ankle plantarflexion 5 5   (Blank rows = not tested)  LUMBAR SPECIAL TESTS:  Straight leg raise test: Positive  FUNCTIONAL TESTS:  5 times sit to stand: 18.2 seconds  GAIT: Distance walked: 59' Assistive device utilized: None Level of assistance: Complete Independence Comments: no significant deviation  TODAY'S TREATMENT:                                                                                                                              DATE:   04/12/23 Therapeutic Exercise: to improve strength and mobility.  Demo, verbal and tactile cues throughout for technique. Nustep L5 x 4 min  Prone leg extension x 10 R/L HEP update, education on core strengthening - avoid flexion like sit ups, crunches  Neuromuscular Reeducation: to centralize symptoms and decrease nerve irritability Standing side glides left x 20 Standing lumbar extension against counter x 20  Prone on elbows Prone press  up with lumbar overpressure to L x 10    Manual Therapy: to decrease muscle spasm and pain and improve mobility L UPA mobs lumbar spine grade 3-4, STM/TPR to L piriformis and glutes, skilled palpation and monitoring during dry needling.  Trigger Point Dry Needling  Initial Treatment: Pt instructed on Dry Needling rational, procedures, and possible side effects. Pt instructed to expect mild to moderate muscle soreness later in the day and/or into the next day.  Pt instructed in methods to reduce muscle soreness. Pt instructed to continue prescribed HEP. Patient was educated on signs and symptoms of infection and other risk factors and advised to seek medical attention should they occur.  Patient verbalized understanding of these  instructions and education.   Patient Verbal Consent Given: Yes Education Handout Provided: Yes Muscles Treated: L glute med, L piriformis Electrical Stimulation Performed: No Treatment Response/Outcome: Twitch Response Elicited and Palpable Increase in Muscle Length   04/08/23 EVAL Neuromuscular Reeducation: to centralize symptoms and decrease neural tension Prone lying to progress to prone on elbows as tolerated.  Stop if symptoms worsen down leg.      PATIENT EDUCATION:  Education details: HEP update, TrDN Person educated: Patient Education method: Explanation, Demonstration, and Handouts Education comprehension: verbalized understanding and returned demonstration  HOME EXERCISE PROGRAM: Access Code: [email or web address] URL: [email or web address] Date: 04/12/2023 Prepared by: Randa Burton  Exercises - Lying Prone  - 1 x daily - 7 x weekly - 1 sets - 1 reps - 3-5 min hold - Static Prone on Elbows  - 1 x daily - 7 x weekly - 1 sets - 1 reps - Prone Press Up  - 1 x daily - 7 x weekly - 1-2 sets - 10 reps - Beginner Prone Single Leg Raise  - 1 x daily - 7 x weekly - 1-2 sets - 10 reps - Prone Knee Flexion AROM  -  1 x daily - 7 x weekly - 1 sets - 10 reps - Left Standing Lateral Shift Correction at Wall - Repetitions  - 3 x daily - 7 x weekly - 1 sets - 10 reps - Standing Lumbar Extension with Counter  - 3 x daily - 7 x weekly - 1 sets - 10 reps  ASSESSMENT:  CLINICAL IMPRESSION: Dinara Scianna  reports good adherence to initial HEP.  Continues to report L sided radicular symptoms.  Today responded very well to [facility] based extension exercises and side glides, as well as L UPA mobs.  After explanation of DN rational, procedures, outcomes and potential side effects, patient verbalized consent to DN treatment in conjunction with manual STM/DTM and TPR to reduce ttp/muscle tension. Treated L piriformis and glute med today as noted referral pattern here as well reproducing  symptoms. DN produced normal response with good twitches elicited resulting in palpable reduction in pain/ttp and muscle tension, with patient noting less pain upon initiation of movement following DN. Pt educated to expect mild to moderate muscle soreness for up to 24-48 hrs and instructed to continue prescribed home exercise program and current activity level with pt verbalizing understanding of theses instructions.   Teal Brookens continues to demonstrate potential for improvement and would benefit from continued skilled therapy to address impairments.      OBJECTIVE IMPAIRMENTS: decreased activity tolerance, decreased strength, increased fascial restrictions, increased muscle spasms, impaired sensation, and pain.   ACTIVITY LIMITATIONS: carrying, lifting, bending, sitting, standing, squatting, sleeping, stairs, and locomotion level  PARTICIPATION LIMITATIONS: meal prep, cleaning, laundry, driving, shopping, community activity, and occupation  PERSONAL FACTORS: Past/current experiences, Time since onset of injury/illness/exacerbation, and 1-2 comorbidities: previous history of LBP, GERD, PreDiabetes, obesity  are also affecting patient's functional outcome.   REHAB POTENTIAL: Good  CLINICAL DECISION MAKING: Evolving/moderate complexity  EVALUATION COMPLEXITY: Moderate   GOALS: Goals reviewed with patient? Yes  SHORT TERM GOALS: Target date: 04/22/2023   Patient will be independent with initial HEP.  Baseline: given Goal status: IN PROGRESS  2.   Patient will report centralization of radicular symptoms.  Baseline: numbness L toes Goal status: IN PROGRESS   LONG TERM GOALS: Target date: 05/20/2023   Patient will be independent with advanced/ongoing HEP to improve outcomes and carryover.  Baseline:  Goal status: IN PROGRESS  2.  Patient will report 75% improvement in low back pain to improve QOL.  Baseline:  Goal status: IN PROGRESS  3.  Patient will demonstrate full pain  free lumbar ROM to perform ADLs.   Baseline: see objective Goal status: IN PROGRESS  4.  Patient will demonstrate improved functional strength as demonstrated by 5/5 LLE all myotomes. Baseline: see objective Goal status: IN PROGRESS  5.  Patient will report at least 6 points improvement on modified Oswestry to demonstrate improved functional ability.  Baseline: 26/50 Goal status: IN PROGRESS   6.  Patient will tolerate 60 min of sitting without increased pain to eat/drive. Baseline: limited to 30 min max, toes go numb Goal status: IN PROGRESS  PLAN:  PT FREQUENCY: 2x/week  PT DURATION: 6 weeks  PLANNED INTERVENTIONS: 97110-Therapeutic exercises, 97530- Therapeutic activity, 97112- Neuromuscular re-education, 97535- Self Care, [id number]- Manual therapy, [id number]- Electrical stimulation (unattended), [id number]- Electrical stimulation (manual), [id number]- Ultrasound, 60454- Traction (mechanical), Patient/Family education, Dry Needling, Joint mobilization, Joint manipulation, Spinal manipulation, Spinal mobilization, Cryotherapy, and Moist heat.  PLAN FOR NEXT SESSION: start [place] extension program exercises, manual therapy, TrDN, traction may be option as well.  Assess response to Conley Deer, PT 04/12/2023, 9:41 AM

## 2023-04-12 NOTE — Patient Instructions (Signed)

## 2023-04-13 DIAGNOSIS — Z62820 Parent-biological child conflict: Secondary | ICD-10-CM | POA: Diagnosis not present

## 2023-04-13 DIAGNOSIS — F4321 Adjustment disorder with depressed mood: Secondary | ICD-10-CM | POA: Diagnosis not present

## 2023-04-14 ENCOUNTER — Ambulatory Visit: Payer: Medicaid Other

## 2023-04-14 DIAGNOSIS — M545 Low back pain, unspecified: Secondary | ICD-10-CM | POA: Diagnosis not present

## 2023-04-14 DIAGNOSIS — M6281 Muscle weakness (generalized): Secondary | ICD-10-CM | POA: Diagnosis not present

## 2023-04-14 DIAGNOSIS — M5442 Lumbago with sciatica, left side: Secondary | ICD-10-CM

## 2023-04-14 DIAGNOSIS — R252 Cramp and spasm: Secondary | ICD-10-CM | POA: Diagnosis not present

## 2023-04-14 NOTE — Therapy (Signed)
OUTPATIENT PHYSICAL THERAPY TREATMENT   Patient Name: Jody Bryan MRN: [id number] DOB:[birth year], 43 y.o., female Today's Date: 04/14/2023  END OF SESSION:  PT End of Session - 04/14/23 1008     Visit Number 3    Date for PT Re-Evaluation 05/20/23    Authorization Type [place] Medicaid    Authorization Time Period pending authorization    PT Start Time 0940   pt late   PT Stop Time 1014    PT Time Calculation (min) 34 min    Activity Tolerance Patient tolerated treatment well    Behavior During Therapy WFL for tasks assessed/performed              Past Medical History:  Diagnosis Date   Bilateral leg edema    Chicken pox    Depression    Drug use    Dyspnea    GERD (gastroesophageal reflux disease)    Low back pain    Past Surgical History:  Procedure Laterality Date   WISDOM TOOTH EXTRACTION  08/2019   Patient Active Problem List   Diagnosis Date Noted   Snoring 02/01/2023   Hx gestational diabetes 10/28/2022   Suspected sleep apnea 10/28/2022   Abnormal metabolism 10/28/2022   Leukocytosis 06/29/2022   Urinary retention 05/28/2022   Allergic conjunctivitis of both eyes 05/28/2022   Urinary frequency 05/28/2022   Contraception, device intrauterine 05/18/2022   Vitamin B12 deficiency 05/18/2022   Inguinal pain 08/12/2021   Class 3 severe obesity with serious comorbidity and body mass index (BMI) of 40.0 to 44.9 in adult (HCC) 07/11/2021   De Quervain's tenosynovitis, bilateral 01/17/2021   Renal cyst 12/22/2019   Bulging lumbar disc 12/22/2019   Preventative health care 08/09/2017   Vitamin D deficiency 04/01/2017   Prediabetes 04/01/2017   Other fatigue 03/17/2017   Low back pain with radiation 07/23/2014   Right wrist pain 09/05/2012   Meralgia paraesthetica 05/25/2012    PCP: Donato Schultz, DO   REFERRING PROVIDER: Donato Schultz, *   REFERRING DIAG: M54.50 (ICD-10-CM) - Low back pain with radiation   Rationale for Evaluation  and Treatment: Rehabilitation  THERAPY DIAG:  Acute left-sided low back pain with left-sided sciatica  Cramp and spasm  Muscle weakness (generalized)  ONSET DATE: ~03/20/2023  SUBJECTIVE:                                                                                                                                                                                           SUBJECTIVE STATEMENT: 04/14/2023 Pt reports continued L radicular pain  I was lifting trashcan at work and felt pain in  back, that's gotten better but now have pain shooting down L side from butt all the way ankle.   Sometimes when sitting feels like toes goes numb, but not when standing.   Jody Bryan been seeing a chiropractor all last year, bit ran out of services last October, started up again in January, he told me to come here 3 times because it feels more like tissue issue than bone issue.   PERTINENT HISTORY:  From MD notes: Persistent pain radiates from the left buttock down the left leg, described as an ache with numbness and burning. Symptoms began on January 20th after lifting a heavy trash can, likely due to nerve impingement from arthritis or muscle issues. Minimal improvement noted with three weeks of chiropractic care. An MRI is needed for further evaluation but requires six weeks of physical therapy first. Physical therapy is necessary to meet insurance requirements for the MRI, alleviate symptoms, and improve function. Avoid heavy lifting and prolonged positions to prevent exacerbation. Refer to physical therapy for at least three weeks. Schedule an MRI after six weeks of physical therapy. Avoid lifting more than 10 pounds. Alternate sitting and standing to avoid prolonged positions. Use ice packs for pain relief. Prescribe methocarbamol for daytime use and refill meloxicam for once-daily use.  PMH:  LBP, GERD, PreDiabetes, obesity PAIN:  Are you having pain? Yes: NPRS scale: 7/10 Pain location: low back left side  down LLE Pain description: dull, sharp, radiating, annoying, toes go numb Aggravating factors: sitting, driving Relieving factors: standing  PRECAUTIONS: None  RED FLAGS: None   WEIGHT BEARING RESTRICTIONS: No  FALLS:  Has patient fallen in last 6 months? No  LIVING ENVIRONMENT: Lives with: lives with their family Lives in: House/apartment Stairs: Yes: External: 2 steps; on left going up Has following equipment at home: None  OCCUPATION: [employer]  PLOF: Independent has young children  PATIENT GOALS: get rid of pain  NEXT MD VISIT: not scheduled yet  OBJECTIVE:   DIAGNOSTIC FINDINGS:   MRI ordered  DG lumbar spine 03/29/2023 FINDINGS: Five non-rib-bearing lumbar vertebra. Normal alignment. Vertebral body heights are normal. No fracture or compression deformity. Mild L4-L5 and L5-S1 disc space narrowing and spurring with progression from prior exam. Minor multilevel facet hypertrophy. No visible pars defects or focal bone abnormalities. The sacroiliac joints are congruent. IUD in the pelvis.   IMPRESSION: 1. Progressive degenerative disc disease at L4-L5 and L5-S1 from 2018. 2. Mild multilevel facet hypertrophy.  PATIENT SURVEYS:  Modified Oswestry 26/50   COGNITION: Overall cognitive status: Within functional limits for tasks assessed     SENSATION: Light touch: Impaired   L4/5 dermatomes  MUSCLE LENGTH: Hamstrings: Right 90 deg; Left 60 deg with increased neural tension and pain in low back  POSTURE: rounded shoulders and forward head  PALPATION: Tenderness/tightness L lumbar paraspinals, L SIJ, L glutes/piriformis  LUMBAR ROM:   AROM eval  Flexion To knees, p!  Extension WNL, p! L side  Right lateral flexion To knees, pulls  Left lateral flexion To knees, pinches L side  Right rotation WNL  Left rotation WNL   (Blank rows = not tested)  LOWER EXTREMITY ROM:      LOWER EXTREMITY MMT:    MMT Right eval Left eval  Hip flexion  5 5  Hip extension    Hip abduction 5 5  Hip adduction 5 5  Knee flexion 5 4  Knee extension 5 4  Ankle dorsiflexion 5 4  Ankle plantarflexion 5 5   (  Blank rows = not tested)  LUMBAR SPECIAL TESTS:  Straight leg raise test: Positive  FUNCTIONAL TESTS:  5 times sit to stand: 18.2 seconds  GAIT: Distance walked: 56' Assistive device utilized: None Level of assistance: Complete Independence Comments: no significant deviation  TODAY'S TREATMENT:                                                                                                                              DATE:  04/14/23 Therapeutic Exercise: to improve strength and mobility.  Demo, verbal and tactile cues throughout for technique. Nustep L3x3 min Supine passive figure 4 stretch x 30" Supine KTOS stretch x 30" Supine LTR to L (non-painful) side x 10  Neuromuscular Reeducation: to centralize symptoms and decrease nerve irritability POE 2 x 1 min each Prone press ups x 10 Prone knee flexion x 10  Bridges x 10 Standing lumbar extension x 10  04/12/23 Therapeutic Exercise: to improve strength and mobility.  Demo, verbal and tactile cues throughout for technique. Nustep L5 x 4 min  Prone leg extension x 10 R/L HEP update, education on core strengthening - avoid flexion like sit ups, crunches  Neuromuscular Reeducation: to centralize symptoms and decrease nerve irritability Standing side glides left x 20 Standing lumbar extension against counter x 20  Prone on elbows Prone press  up with lumbar overpressure to L x 10    Manual Therapy: to decrease muscle spasm and pain and improve mobility L UPA mobs lumbar spine grade 3-4, STM/TPR to L piriformis and glutes, skilled palpation and monitoring during dry needling.  Trigger Point Dry Needling  Initial Treatment: Pt instructed on Dry Needling rational, procedures, and possible side effects. Pt instructed to expect mild to moderate muscle soreness later in the day  and/or into the next day.  Pt instructed in methods to reduce muscle soreness. Pt instructed to continue prescribed HEP. Patient was educated on signs and symptoms of infection and other risk factors and advised to seek medical attention should they occur.  Patient verbalized understanding of these instructions and education.   Patient Verbal Consent Given: Yes Education Handout Provided: Yes Muscles Treated: L glute med, L piriformis Electrical Stimulation Performed: No Treatment Response/Outcome: Twitch Response Elicited and Palpable Increase in Muscle Length   04/08/23 EVAL Neuromuscular Reeducation: to centralize symptoms and decrease neural tension Prone lying to progress to prone on elbows as tolerated.  Stop if symptoms worsen down leg.      PATIENT EDUCATION:  Education details: HEP update, TrDN Person educated: Patient Education method: Explanation, Demonstration, and Handouts Education comprehension: verbalized understanding and returned demonstration  HOME EXERCISE PROGRAM: Access Code: [email or web address] URL: [email or web address] Date: 04/14/2023 Prepared by: Verta Ellen  Exercises - Lying Prone  - 1 x daily - 7 x weekly - 1 sets - 1 reps - 3-5 min hold - Static Prone on Elbows  - 1 x daily - 7 x weekly - 1 sets - 1 reps -  Prone Press Up  - 1 x daily - 7 x weekly - 1-2 sets - 10 reps - Beginner Prone Single Leg Raise  - 1 x daily - 7 x weekly - 1-2 sets - 10 reps - Prone Knee Flexion AROM  - 1 x daily - 7 x weekly - 1 sets - 10 reps - Left Standing Lateral Shift Correction at Wall - Repetitions  - 3 x daily - 7 x weekly - 1 sets - 10 reps - Standing Lumbar Extension with Counter  - 3 x daily - 7 x weekly - 1 sets - 10 reps - Supine Lower Trunk Rotation  - 1 x daily - 7 x weekly - 2 sets - 10 reps - Supine Bridge  - 1 x daily - 7 x weekly - 2 sets - 10 reps  ASSESSMENT:  CLINICAL IMPRESSION: Pt arrived 10 min late today. Continued with McKenzie extension  exercises to address radicular pain, with less pain reported post session by patient. She reports partial compliance with HEP, so we talked about the importance of HEP compliance for max benefit with PT. She tolerated the interventions well today. Jody Bryan continues to demonstrate potential for improvement and would benefit from continued skilled therapy to address impairments.      OBJECTIVE IMPAIRMENTS: decreased activity tolerance, decreased strength, increased fascial restrictions, increased muscle spasms, impaired sensation, and pain.   ACTIVITY LIMITATIONS: carrying, lifting, bending, sitting, standing, squatting, sleeping, stairs, and locomotion level  PARTICIPATION LIMITATIONS: meal prep, cleaning, laundry, driving, shopping, community activity, and occupation  PERSONAL FACTORS: Past/current experiences, Time since onset of injury/illness/exacerbation, and 1-2 comorbidities: previous history of LBP, GERD, PreDiabetes, obesity  are also affecting patient's functional outcome.   REHAB POTENTIAL: Good  CLINICAL DECISION MAKING: Evolving/moderate complexity  EVALUATION COMPLEXITY: Moderate   GOALS: Goals reviewed with patient? Yes  SHORT TERM GOALS: Target date: 04/22/2023   Patient will be independent with initial HEP.  Baseline: given Goal status: IN PROGRESS  2.   Patient will report centralization of radicular symptoms.  Baseline: numbness L toes Goal status: IN PROGRESS   LONG TERM GOALS: Target date: 05/20/2023   Patient will be independent with advanced/ongoing HEP to improve outcomes and carryover.  Baseline:  Goal status: IN PROGRESS  2.  Patient will report 75% improvement in low back pain to improve QOL.  Baseline:  Goal status: IN PROGRESS  3.  Patient will demonstrate full pain free lumbar ROM to perform ADLs.   Baseline: see objective Goal status: IN PROGRESS  4.  Patient will demonstrate improved functional strength as demonstrated by 5/5 LLE all  myotomes. Baseline: see objective Goal status: IN PROGRESS  5.  Patient will report at least 6 points improvement on modified Oswestry to demonstrate improved functional ability.  Baseline: 26/50 Goal status: IN PROGRESS   6.  Patient will tolerate 60 min of sitting without increased pain to eat/drive. Baseline: limited to 30 min max, toes go numb Goal status: IN PROGRESS  PLAN:  PT FREQUENCY: 2x/week  PT DURATION: 6 weeks  PLANNED INTERVENTIONS: 97110-Therapeutic exercises, 97530- Therapeutic activity, 97112- Neuromuscular re-education, 97535- Self Care, 16109- Manual therapy, [id number]- Electrical stimulation (unattended), [id number]- Electrical stimulation (manual), [id number]- Ultrasound, [id number]- Traction (mechanical), Patient/Family education, Dry Needling, Joint mobilization, Joint manipulation, Spinal manipulation, Spinal mobilization, Cryotherapy, and Moist heat.  PLAN FOR NEXT SESSION: start McKenzie extension program exercises, manual therapy, TrDN, traction may be option as well.   Assess response to TrDN   Darleene Cleaver, PTA  04/14/2023, 10:14 AM

## 2023-04-19 ENCOUNTER — Ambulatory Visit: Payer: Medicaid Other | Admitting: Physical Therapy

## 2023-04-20 ENCOUNTER — Other Ambulatory Visit: Payer: Self-pay | Admitting: Family

## 2023-04-22 ENCOUNTER — Ambulatory Visit: Payer: Medicaid Other | Admitting: Physical Therapy

## 2023-04-26 ENCOUNTER — Ambulatory Visit: Payer: Medicaid Other

## 2023-04-26 DIAGNOSIS — M5442 Lumbago with sciatica, left side: Secondary | ICD-10-CM | POA: Diagnosis not present

## 2023-04-26 DIAGNOSIS — M545 Low back pain, unspecified: Secondary | ICD-10-CM | POA: Diagnosis not present

## 2023-04-26 DIAGNOSIS — R252 Cramp and spasm: Secondary | ICD-10-CM

## 2023-04-26 DIAGNOSIS — M6281 Muscle weakness (generalized): Secondary | ICD-10-CM

## 2023-04-26 NOTE — Therapy (Signed)
 OUTPATIENT PHYSICAL THERAPY TREATMENT   Patient Name: Jody Bryan MRN: [id number] DOB:1980-03-23, 43 y.o., female Today's Date: 04/26/2023  END OF SESSION:     Past Medical History:  Diagnosis Date   Bilateral leg edema    Chicken pox    Depression    Drug use    Dyspnea    GERD (gastroesophageal reflux disease)    Low back pain    Past Surgical History:  Procedure Laterality Date   WISDOM TOOTH EXTRACTION  08/2019   Patient Active Problem List   Diagnosis Date Noted   Snoring 02/01/2023   Hx gestational diabetes 10/28/2022   Suspected sleep apnea 10/28/2022   Abnormal metabolism 10/28/2022   Leukocytosis 06/29/2022   Urinary retention 05/28/2022   Allergic conjunctivitis of both eyes 05/28/2022   Urinary frequency 05/28/2022   Contraception, device intrauterine 05/18/2022   Vitamin B12 deficiency 05/18/2022   Inguinal pain 08/12/2021   Class 3 severe obesity with serious comorbidity and body mass index (BMI) of 40.0 to 44.9 in adult (HCC) 07/11/2021   De Quervain's tenosynovitis, bilateral 01/17/2021   Renal cyst 12/22/2019   Bulging lumbar disc 12/22/2019   Preventative health care 08/09/2017   Vitamin D deficiency 04/01/2017   Prediabetes 04/01/2017   Other fatigue 03/17/2017   Low back pain with radiation 07/23/2014   Right wrist pain 09/05/2012   Meralgia paraesthetica 05/25/2012    PCP: Donato Schultz, DO   REFERRING PROVIDER: Donato Schultz, *   REFERRING DIAG: M54.50 (ICD-10-CM) - Low back pain with radiation   Rationale for Evaluation and Treatment: Rehabilitation  THERAPY DIAG:  Acute left-sided low back pain with left-sided sciatica  Cramp and spasm  Muscle weakness (generalized)  ONSET DATE: ~03/20/2023  SUBJECTIVE:                                                                                                                                                                                           SUBJECTIVE  STATEMENT: 04/26/2023 "Walked a bit too much yesterday, pain hasn't been as bad."  I was lifting trashcan at work and felt pain in back, that's gotten better but now have pain shooting down L side from butt all the way ankle.   Sometimes when sitting feels like toes goes numb, but not when standing.   Lavenia Atlas been seeing a chiropractor all last year, bit ran out of services last October, started up again in January, he told me to come here 3 times because it feels more like tissue issue than bone issue.   PERTINENT HISTORY:  From MD notes: Persistent pain radiates from the left buttock  down the left leg, described as an ache with numbness and burning. Symptoms began on January 20th after lifting a heavy trash can, likely due to nerve impingement from arthritis or muscle issues. Minimal improvement noted with three weeks of chiropractic care. An MRI is needed for further evaluation but requires six weeks of physical therapy first. Physical therapy is necessary to meet insurance requirements for the MRI, alleviate symptoms, and improve function. Avoid heavy lifting and prolonged positions to prevent exacerbation. Refer to physical therapy for at least three weeks. Schedule an MRI after six weeks of physical therapy. Avoid lifting more than 10 pounds. Alternate sitting and standing to avoid prolonged positions. Use ice packs for pain relief. Prescribe methocarbamol for daytime use and refill meloxicam for once-daily use.  PMH:  LBP, GERD, PreDiabetes, obesity PAIN:  Are you having pain? Yes: NPRS scale: 6/10 Pain location: low back left side down to posterior thigh Pain description:  radiating Aggravating factors: sitting, driving Relieving factors: standing  PRECAUTIONS: None  RED FLAGS: None   WEIGHT BEARING RESTRICTIONS: No  FALLS:  Has patient fallen in last 6 months? No  LIVING ENVIRONMENT: Lives with: lives with their family Lives in: House/apartment Stairs: Yes: External: 2 steps; on left  going up Has following equipment at home: None  OCCUPATION: [employer]  PLOF: Independent has young children  PATIENT GOALS: get rid of pain  NEXT MD VISIT: not scheduled yet  OBJECTIVE:   DIAGNOSTIC FINDINGS:   MRI ordered  DG lumbar spine 03/29/2023 FINDINGS: Five non-rib-bearing lumbar vertebra. Normal alignment. Vertebral body heights are normal. No fracture or compression deformity. Mild L4-L5 and L5-S1 disc space narrowing and spurring with progression from prior exam. Minor multilevel facet hypertrophy. No visible pars defects or focal bone abnormalities. The sacroiliac joints are congruent. IUD in the pelvis.   IMPRESSION: 1. Progressive degenerative disc disease at L4-L5 and L5-S1 from 2018. 2. Mild multilevel facet hypertrophy.  PATIENT SURVEYS:  Modified Oswestry 26/50   COGNITION: Overall cognitive status: Within functional limits for tasks assessed     SENSATION: Light touch: Impaired   L4/5 dermatomes  MUSCLE LENGTH: Hamstrings: Right 90 deg; Left 60 deg with increased neural tension and pain in low back  POSTURE: rounded shoulders and forward head  PALPATION: Tenderness/tightness L lumbar paraspinals, L SIJ, L glutes/piriformis  LUMBAR ROM:   AROM eval  Flexion To knees, p!  Extension WNL, p! L side  Right lateral flexion To knees, pulls  Left lateral flexion To knees, pinches L side  Right rotation WNL  Left rotation WNL   (Blank rows = not tested)  LOWER EXTREMITY ROM:      LOWER EXTREMITY MMT:    MMT Right eval Left eval  Hip flexion 5 5  Hip extension    Hip abduction 5 5  Hip adduction 5 5  Knee flexion 5 4  Knee extension 5 4  Ankle dorsiflexion 5 4  Ankle plantarflexion 5 5   (Blank rows = not tested)  LUMBAR SPECIAL TESTS:  Straight leg raise test: Positive  FUNCTIONAL TESTS:  5 times sit to stand: 18.2 seconds  GAIT: Distance walked: 44' Assistive device utilized: None Level of assistance: Complete  Independence Comments: no significant deviation  TODAY'S TREATMENT:  DATE:  04/26/23 Therapeutic Exercise: to improve strength and mobility.  Demo, verbal and tactile cues throughout for technique. Nustep L5x4 min Cat/cow x 10 bil Supine LTR x 10  Neuromuscular Reeducation: to centralize symptoms and decrease nerve irritability Press ups x 10 Prone knee flexion x 10  Prone hip extension x 10  Quadruped hip extension x 10 bil Bridges 2x10 3 sec hold   04/14/23 Therapeutic Exercise: to improve strength and mobility.  Demo, verbal and tactile cues throughout for technique. Nustep L3x3 min Supine passive figure 4 stretch x 30" Supine KTOS stretch x 30" Supine LTR to L (non-painful) side x 10  Neuromuscular Reeducation: to centralize symptoms and decrease nerve irritability POE 2 x 1 min each Prone press ups x 10 Prone knee flexion x 10  Bridges x 10 Standing lumbar extension x 10  04/12/23 Therapeutic Exercise: to improve strength and mobility.  Demo, verbal and tactile cues throughout for technique. Nustep L5 x 4 min  Prone leg extension x 10 R/L HEP update, education on core strengthening - avoid flexion like sit ups, crunches  Neuromuscular Reeducation: to centralize symptoms and decrease nerve irritability Standing side glides left x 20 Standing lumbar extension against counter x 20  Prone on elbows Prone press  up with lumbar overpressure to L x 10    Manual Therapy: to decrease muscle spasm and pain and improve mobility L UPA mobs lumbar spine grade 3-4, STM/TPR to L piriformis and glutes, skilled palpation and monitoring during dry needling.  Trigger Point Dry Needling  Initial Treatment: Pt instructed on Dry Needling rational, procedures, and possible side effects. Pt instructed to expect mild to moderate muscle soreness later in the day and/or  into the next day.  Pt instructed in methods to reduce muscle soreness. Pt instructed to continue prescribed HEP. Patient was educated on signs and symptoms of infection and other risk factors and advised to seek medical attention should they occur.  Patient verbalized understanding of these instructions and education.   Patient Verbal Consent Given: Yes Education Handout Provided: Yes Muscles Treated: L glute med, L piriformis Electrical Stimulation Performed: No Treatment Response/Outcome: Twitch Response Elicited and Palpable Increase in Muscle Length   04/08/23 EVAL Neuromuscular Reeducation: to centralize symptoms and decrease neural tension Prone lying to progress to prone on elbows as tolerated.  Stop if symptoms worsen down leg.      PATIENT EDUCATION:  Education details: HEP update, TrDN Person educated: Patient Education method: Explanation, Demonstration, and Handouts Education comprehension: verbalized understanding and returned demonstration  HOME EXERCISE PROGRAM: Access Code: [email or web address] URL: [email or web address] Date: 04/26/2023 Prepared by: Verta Ellen  Exercises - Lying Prone  - 1 x daily - 7 x weekly - 1 sets - 1 reps - 3-5 min hold - Static Prone on Elbows  - 1 x daily - 7 x weekly - 1 sets - 1 reps - Prone Press Up  - 1 x daily - 7 x weekly - 1-2 sets - 10 reps - Beginner Prone Single Leg Raise  - 1 x daily - 7 x weekly - 1-2 sets - 10 reps - Prone Knee Flexion AROM  - 1 x daily - 7 x weekly - 1 sets - 10 reps - Left Standing Lateral Shift Correction at Wall - Repetitions  - 3 x daily - 7 x weekly - 1 sets - 10 reps - Standing Lumbar Extension with Counter  - 3 x daily - 7 x weekly - 1 sets - 10 reps -  Supine Lower Trunk Rotation  - 1 x daily - 7 x weekly - 2 sets - 10 reps - Supine Bridge  - 1 x daily - 7 x weekly - 2 sets - 10 reps - Beginner Front Arm Support  - 1 x daily - 7 x weekly - 2 sets - 10 reps  ASSESSMENT:  CLINICAL  IMPRESSION: Pt arrived 20 min late today. Continued with McKenzie extension exercises to address radicular pain. Cues provided to avoid rotating her pelvis with the hip extension motions and to keep her core engage. Provided HEP updates for more strengthening of glutes and erector spinae group. Lorah Kalina continues to demonstrate potential for improvement and would benefit from continued skilled therapy to address impairments.      OBJECTIVE IMPAIRMENTS: decreased activity tolerance, decreased strength, increased fascial restrictions, increased muscle spasms, impaired sensation, and pain.   ACTIVITY LIMITATIONS: carrying, lifting, bending, sitting, standing, squatting, sleeping, stairs, and locomotion level  PARTICIPATION LIMITATIONS: meal prep, cleaning, laundry, driving, shopping, community activity, and occupation  PERSONAL FACTORS: Past/current experiences, Time since onset of injury/illness/exacerbation, and 1-2 comorbidities: previous history of LBP, GERD, PreDiabetes, obesity  are also affecting patient's functional outcome.   REHAB POTENTIAL: Good  CLINICAL DECISION MAKING: Evolving/moderate complexity  EVALUATION COMPLEXITY: Moderate   GOALS: Goals reviewed with patient? Yes  SHORT TERM GOALS: Target date: 04/22/2023   Patient will be independent with initial HEP.  Baseline: given Goal status: MET- 04/26/23  2.   Patient will report centralization of radicular symptoms.  Baseline: numbness L toes Goal status: IN PROGRESS- 04/26/23 pain goes down posterior    LONG TERM GOALS: Target date: 05/20/2023   Patient will be independent with advanced/ongoing HEP to improve outcomes and carryover.  Baseline:  Goal status: IN PROGRESS  2.  Patient will report 75% improvement in low back pain to improve QOL.  Baseline:  Goal status: IN PROGRESS  3.  Patient will demonstrate full pain free lumbar ROM to perform ADLs.   Baseline: see objective Goal status: IN PROGRESS  4.   Patient will demonstrate improved functional strength as demonstrated by 5/5 LLE all myotomes. Baseline: see objective Goal status: IN PROGRESS  5.  Patient will report at least 6 points improvement on modified Oswestry to demonstrate improved functional ability.  Baseline: 26/50 Goal status: IN PROGRESS   6.  Patient will tolerate 60 min of sitting without increased pain to eat/drive. Baseline: limited to 30 min max, toes go numb Goal status: IN PROGRESS  PLAN:  PT FREQUENCY: 2x/week  PT DURATION: 6 weeks  PLANNED INTERVENTIONS: 97110-Therapeutic exercises, 97530- Therapeutic activity, 97112- Neuromuscular re-education, 97535- Self Care, 16109- Manual therapy, [id number]- Electrical stimulation (unattended), [id number]- Electrical stimulation (manual), [id number]- Ultrasound, [id number]- Traction (mechanical), Patient/Family education, Dry Needling, Joint mobilization, Joint manipulation, Spinal manipulation, Spinal mobilization, Cryotherapy, and Moist heat.  PLAN FOR NEXT SESSION: start McKenzie extension program exercises, manual therapy, TrDN, traction may be option as well.   Assess response to Cathe Mons, PTA 04/26/2023, 8:23 AM

## 2023-04-29 ENCOUNTER — Ambulatory Visit: Payer: Medicaid Other | Admitting: Physical Therapy

## 2023-04-29 ENCOUNTER — Encounter: Payer: Self-pay | Admitting: Physical Therapy

## 2023-04-29 DIAGNOSIS — M6281 Muscle weakness (generalized): Secondary | ICD-10-CM

## 2023-04-29 DIAGNOSIS — M5442 Lumbago with sciatica, left side: Secondary | ICD-10-CM

## 2023-04-29 DIAGNOSIS — M545 Low back pain, unspecified: Secondary | ICD-10-CM | POA: Diagnosis not present

## 2023-04-29 DIAGNOSIS — R252 Cramp and spasm: Secondary | ICD-10-CM

## 2023-04-29 NOTE — Therapy (Signed)
 OUTPATIENT PHYSICAL THERAPY TREATMENT   Patient Name: Jody Bryan MRN: [id number] DOB:May 18, 1980, 43 y.o., female Today's Date: 04/29/2023  END OF SESSION:  PT End of Session - 04/29/23 1625     Visit Number 5    Date for PT Re-Evaluation 05/20/23    Authorization Type [place] medicaid; 04/08/23- 06/06/23    PT Start Time 1625    PT Stop Time 1703    PT Time Calculation (min) 38 min    Activity Tolerance Patient tolerated treatment well    Behavior During Therapy WFL for tasks assessed/performed               Past Medical History:  Diagnosis Date   Bilateral leg edema    Chicken pox    Depression    Drug use    Dyspnea    GERD (gastroesophageal reflux disease)    Low back pain    Past Surgical History:  Procedure Laterality Date   WISDOM TOOTH EXTRACTION  08/2019   Patient Active Problem List   Diagnosis Date Noted   Snoring 02/01/2023   Hx gestational diabetes 10/28/2022   Suspected sleep apnea 10/28/2022   Abnormal metabolism 10/28/2022   Leukocytosis 06/29/2022   Urinary retention 05/28/2022   Allergic conjunctivitis of both eyes 05/28/2022   Urinary frequency 05/28/2022   Contraception, device intrauterine 05/18/2022   Vitamin B12 deficiency 05/18/2022   Inguinal pain 08/12/2021   Class 3 severe obesity with serious comorbidity and body mass index (BMI) of 40.0 to 44.9 in adult (HCC) 07/11/2021   De Quervain's tenosynovitis, bilateral 01/17/2021   Renal cyst 12/22/2019   Bulging lumbar disc 12/22/2019   Preventative health care 08/09/2017   Vitamin D deficiency 04/01/2017   Prediabetes 04/01/2017   Other fatigue 03/17/2017   Low back pain with radiation 07/23/2014   Right wrist pain 09/05/2012   Meralgia paraesthetica 05/25/2012    PCP: Donato Schultz, DO   REFERRING PROVIDER: Donato Schultz, *   REFERRING DIAG: M54.50 (ICD-10-CM) - Low back pain with radiation   Rationale for Evaluation and Treatment: Rehabilitation  THERAPY  DIAG:  Acute left-sided low back pain with left-sided sciatica  Cramp and spasm  Muscle weakness (generalized)  ONSET DATE: ~03/20/2023  SUBJECTIVE:                                                                                                                                                                                           SUBJECTIVE STATEMENT: 04/29/2023 Back has been fine, still have pain in the back of her Left leg.   From IE: I was lifting trashcan at work and  felt pain in back, that's gotten better but now have pain shooting down L side from butt all the way ankle.   Sometimes when sitting feels like toes goes numb, but not when standing.   Lavenia Atlas been seeing a chiropractor all last year, bit ran out of services last October, started up again in January, he told me to come here 3 times because it feels more like tissue issue than bone issue.   PERTINENT HISTORY:  From MD notes: Persistent pain radiates from the left buttock down the left leg, described as an ache with numbness and burning. Symptoms began on January 20th after lifting a heavy trash can, likely due to nerve impingement from arthritis or muscle issues. Minimal improvement noted with three weeks of chiropractic care. An MRI is needed for further evaluation but requires six weeks of physical therapy first. Physical therapy is necessary to meet insurance requirements for the MRI, alleviate symptoms, and improve function. Avoid heavy lifting and prolonged positions to prevent exacerbation. Refer to physical therapy for at least three weeks. Schedule an MRI after six weeks of physical therapy. Avoid lifting more than 10 pounds. Alternate sitting and standing to avoid prolonged positions. Use ice packs for pain relief. Prescribe methocarbamol for daytime use and refill meloxicam for once-daily use.  PMH:  LBP, GERD, PreDiabetes, obesity PAIN:  Are you having pain? Yes: NPRS scale: 5/10 Pain location:  posterior thigh Pain  description:  radiating Aggravating factors: sitting, driving Relieving factors: standing  PRECAUTIONS: None  RED FLAGS: None   WEIGHT BEARING RESTRICTIONS: No  FALLS:  Has patient fallen in last 6 months? No  LIVING ENVIRONMENT: Lives with: lives with their family Lives in: House/apartment Stairs: Yes: External: 2 steps; on left going up Has following equipment at home: None  OCCUPATION: [employer]  PLOF: Independent has young children  PATIENT GOALS: get rid of pain  NEXT MD VISIT: not scheduled yet  OBJECTIVE:   DIAGNOSTIC FINDINGS:   MRI ordered  DG lumbar spine 03/29/2023 FINDINGS: Five non-rib-bearing lumbar vertebra. Normal alignment. Vertebral body heights are normal. No fracture or compression deformity. Mild L4-L5 and L5-S1 disc space narrowing and spurring with progression from prior exam. Minor multilevel facet hypertrophy. No visible pars defects or focal bone abnormalities. The sacroiliac joints are congruent. IUD in the pelvis.   IMPRESSION: 1. Progressive degenerative disc disease at L4-L5 and L5-S1 from 2018. 2. Mild multilevel facet hypertrophy.  PATIENT SURVEYS:  Modified Oswestry 26/50   COGNITION: Overall cognitive status: Within functional limits for tasks assessed     SENSATION: Light touch: Impaired   L4/5 dermatomes  MUSCLE LENGTH: Hamstrings: Right 90 deg; Left 60 deg with increased neural tension and pain in low back  POSTURE: rounded shoulders and forward head  PALPATION: Tenderness/tightness L lumbar paraspinals, L SIJ, L glutes/piriformis  LUMBAR ROM:   AROM eval  Flexion To knees, p!  Extension WNL, p! L side  Right lateral flexion To knees, pulls  Left lateral flexion To knees, pinches L side  Right rotation WNL  Left rotation WNL   (Blank rows = not tested)  LOWER EXTREMITY ROM:      LOWER EXTREMITY MMT:    MMT Right eval Left eval  Hip flexion 5 5  Hip extension    Hip abduction 5 5  Hip  adduction 5 5  Knee flexion 5 4  Knee extension 5 4  Ankle dorsiflexion 5 4  Ankle plantarflexion 5 5   (Blank rows = not  tested)  LUMBAR SPECIAL TESTS:  Straight leg raise test: Positive  FUNCTIONAL TESTS:  5 times sit to stand: 18.2 seconds  GAIT: Distance walked: 55' Assistive device utilized: None Level of assistance: Complete Independence Comments: no significant deviation  TODAY'S TREATMENT:                                                                                                                              DATE:   04/29/23  Neuromuscular Reeducation:  to decrease nerve irritability and centralize symptoms.  Repeated side glides x 10  Repeated lumbar extension at wall x 10  Prone press-ups with R lumbar overpressure x 10 R UPA mobs to lumbar spine Seated anterior tilts with black strap Demo sciatic nerve glide in sitting Review of HEP Therapeutic Exercise: to improve strength and mobility.  Demo, verbal and tactile cues throughout for technique. Prone leg extensions x 10 r/l, x 5 r/l Supine bridges 2 x 15 3 sec hold Supine marches with TrA contraction x 10    04/26/23 Therapeutic Exercise: to improve strength and mobility.  Demo, verbal and tactile cues throughout for technique. Nustep L5x4 min Cat/cow x 10 bil Supine LTR x 10  Neuromuscular Reeducation: to centralize symptoms and decrease nerve irritability Press ups x 10 Prone knee flexion x 10  Prone hip extension x 10  Quadruped hip extension x 10 bil Bridges 2x10 3 sec hold   04/14/23 Therapeutic Exercise: to improve strength and mobility.  Demo, verbal and tactile cues throughout for technique. Nustep L3x3 min Supine passive figure 4 stretch x 30" Supine KTOS stretch x 30" Supine LTR to L (non-painful) side x 10  Neuromuscular Reeducation: to centralize symptoms and decrease nerve irritability POE 2 x 1 min each Prone press ups x 10 Prone knee flexion x 10  Bridges x 10 Standing lumbar  extension x 10     PATIENT EDUCATION:  Education details: HEP review and update Person educated: Patient Education method: Explanation, Demonstration, and Handouts Education comprehension: verbalized understanding and returned demonstration  HOME EXERCISE PROGRAM: Access Code: [email or web address] URL: [email or web address] Date: 04/29/2023 Prepared by: Harrie Foreman  Exercises - Left Standing Lateral Shift Correction at Wall - Repetitions  - 3 x daily - 7 x weekly - 1 sets - 10 reps - Standing Lumbar Extension at Wall - Forearms  - 3 x daily - 7 x weekly - 1 sets - 10 reps - Static Prone on Elbows  - 1 x daily - 7 x weekly - 1 sets - 1 reps - Standing Lumbar Extension with Counter  - 3 x daily - 7 x weekly - 1 sets - 10 reps - Prone Press Up  - 3 x daily - 7 x weekly - 1-2 sets - 10 reps - Beginner Prone Single Leg Raise  - 1 x daily - 7 x weekly - 1-2 sets - 10 reps - Supine Lower Trunk Rotation  - 1 x daily - 7  x weekly - 2 sets - 10 reps - Supine Bridge  - 1 x daily - 7 x weekly - 2 sets - 10 reps - Beginner Front Arm Support  - 1 x daily - 7 x weekly - 2 sets - 10 reps - Seated Sciatic Tensioner  - 3 x daily - 7 x weekly - 1 sets - 10 reps - Seated Anterior Pelvic Tilt  - 1 x daily - 7 x weekly - 1 sets - 10 reps  ASSESSMENT:  CLINICAL IMPRESSION: Pt arrived 10 min late today. Continued with McKenzie extension exercises.  Cues needed that these were not stretches, but glides to open up back, once form correct she noted that nerve pain was centralizing.  Went over HEP to clarify which exercises were for strengthening v. Which exercises were to centralize symptoms and should be performed more frequently throughout the day as needed.   Keiley Levey continues to demonstrate potential for improvement and would benefit from continued skilled therapy to address impairments.      OBJECTIVE IMPAIRMENTS: decreased activity tolerance, decreased strength, increased fascial  restrictions, increased muscle spasms, impaired sensation, and pain.   ACTIVITY LIMITATIONS: carrying, lifting, bending, sitting, standing, squatting, sleeping, stairs, and locomotion level  PARTICIPATION LIMITATIONS: meal prep, cleaning, laundry, driving, shopping, community activity, and occupation  PERSONAL FACTORS: Past/current experiences, Time since onset of injury/illness/exacerbation, and 1-2 comorbidities: previous history of LBP, GERD, PreDiabetes, obesity  are also affecting patient's functional outcome.   REHAB POTENTIAL: Good  CLINICAL DECISION MAKING: Evolving/moderate complexity  EVALUATION COMPLEXITY: Moderate   GOALS: Goals reviewed with patient? Yes  SHORT TERM GOALS: Target date: 04/22/2023   Patient will be independent with initial HEP.  Baseline: given Goal status: MET- 04/26/23  2.   Patient will report centralization of radicular symptoms.  Baseline: numbness L toes Goal status: IN PROGRESS- 04/26/23 pain goes down posterior    LONG TERM GOALS: Target date: 05/20/2023   Patient will be independent with advanced/ongoing HEP to improve outcomes and carryover.  Baseline:  Goal status: IN PROGRESS  2.  Patient will report 75% improvement in low back pain to improve QOL.  Baseline:  Goal status: IN PROGRESS   3.  Patient will demonstrate full pain free lumbar ROM to perform ADLs.   Baseline: see objective Goal status: IN PROGRESS 04/29/23- reports no pain with lumbar AROM today  4.  Patient will demonstrate improved functional strength as demonstrated by 5/5 LLE all myotomes. Baseline: see objective Goal status: IN PROGRESS  5.  Patient will report at least 6 points improvement on modified Oswestry to demonstrate improved functional ability.  Baseline: 26/50 Goal status: IN PROGRESS   6.  Patient will tolerate 60 min of sitting without increased pain to eat/drive. Baseline: limited to 30 min max, toes go numb Goal status: IN PROGRESS  PLAN:  PT  FREQUENCY: 2x/week  PT DURATION: 6 weeks  PLANNED INTERVENTIONS: 97110-Therapeutic exercises, 97530- Therapeutic activity, 97112- Neuromuscular re-education, 97535- Self Care, 16109- Manual therapy, 97014- Electrical stimulation (unattended), [id number]- Electrical stimulation (manual), [id number]- Ultrasound, 60454- Traction (mechanical), Patient/Family education, Dry Needling, Joint mobilization, Joint manipulation, Spinal manipulation, Spinal mobilization, Cryotherapy, and Moist heat.  PLAN FOR NEXT SESSION: continue extension exercises, update goals.     Jena Gauss, PT 04/29/2023, 5:21 PM

## 2023-05-03 ENCOUNTER — Ambulatory Visit: Payer: Medicaid Other

## 2023-05-03 ENCOUNTER — Telehealth: Payer: Self-pay

## 2023-05-03 ENCOUNTER — Telehealth: Payer: Self-pay | Admitting: Family Medicine

## 2023-05-03 DIAGNOSIS — Z1211 Encounter for screening for malignant neoplasm of colon: Secondary | ICD-10-CM

## 2023-05-03 NOTE — Telephone Encounter (Signed)
 Pt called checking status on referral to GI. After looking at referral, looks like Ortho documented on GI referral and closed it. I am not able to delete info and new referral needs to be placed for GI.

## 2023-05-03 NOTE — Telephone Encounter (Signed)
 Looks like the referral was closed but there isn't any documentation. Please advise

## 2023-05-03 NOTE — Telephone Encounter (Signed)
 Copied from CRM [id number]. Topic: Referral - Status >> May 03, 2023 12:16 PM Whitney O wrote: Reason for CRM: patient calling to see did doctor put in a referral for her . Called cal to get info but the appointment or the referral looks wrong so they said they will give the patient a call once it is figured out . Patient is wanting to know will it be this week  Please give patient a call once everything for the referral has been figured out  [phone]

## 2023-05-03 NOTE — Telephone Encounter (Signed)
 Done

## 2023-05-03 NOTE — Addendum Note (Signed)
 Addended by: Roxanne Gates on: 05/03/2023 04:14 PM   Modules accepted: Orders

## 2023-05-04 DIAGNOSIS — F4323 Adjustment disorder with mixed anxiety and depressed mood: Secondary | ICD-10-CM | POA: Diagnosis not present

## 2023-05-04 DIAGNOSIS — Z630 Problems in relationship with spouse or partner: Secondary | ICD-10-CM | POA: Diagnosis not present

## 2023-05-06 ENCOUNTER — Ambulatory Visit: Payer: Medicaid Other | Admitting: Physical Therapy

## 2023-05-12 ENCOUNTER — Ambulatory Visit: Attending: Family Medicine

## 2023-05-12 DIAGNOSIS — R252 Cramp and spasm: Secondary | ICD-10-CM | POA: Insufficient documentation

## 2023-05-12 DIAGNOSIS — M5442 Lumbago with sciatica, left side: Secondary | ICD-10-CM | POA: Diagnosis not present

## 2023-05-12 DIAGNOSIS — M6281 Muscle weakness (generalized): Secondary | ICD-10-CM | POA: Diagnosis not present

## 2023-05-12 NOTE — Therapy (Signed)
 OUTPATIENT PHYSICAL THERAPY TREATMENT   Patient Name: Jody Bryan MRN: [id number] DOB:1980/03/14, 43 y.o., female Today's Date: 05/12/2023  END OF SESSION:  PT End of Session - 05/12/23 0904     Visit Number 6    Date for PT Re-Evaluation 05/20/23    Authorization Type [place] medicaid; 04/08/23- 06/06/23    PT Start Time 0900    PT Stop Time 0930    PT Time Calculation (min) 30 min    Activity Tolerance Patient tolerated treatment well    Behavior During Therapy WFL for tasks assessed/performed                Past Medical History:  Diagnosis Date   Bilateral leg edema    Chicken pox    Depression    Drug use    Dyspnea    GERD (gastroesophageal reflux disease)    Low back pain    Past Surgical History:  Procedure Laterality Date   WISDOM TOOTH EXTRACTION  08/2019   Patient Active Problem List   Diagnosis Date Noted   Snoring 02/01/2023   Hx gestational diabetes 10/28/2022   Suspected sleep apnea 10/28/2022   Abnormal metabolism 10/28/2022   Leukocytosis 06/29/2022   Urinary retention 05/28/2022   Allergic conjunctivitis of both eyes 05/28/2022   Urinary frequency 05/28/2022   Contraception, device intrauterine 05/18/2022   Vitamin B12 deficiency 05/18/2022   Inguinal pain 08/12/2021   Class 3 severe obesity with serious comorbidity and body mass index (BMI) of 40.0 to 44.9 in adult (HCC) 07/11/2021   De Quervain's tenosynovitis, bilateral 01/17/2021   Renal cyst 12/22/2019   Bulging lumbar disc 12/22/2019   Preventative health care 08/09/2017   Vitamin D deficiency 04/01/2017   Prediabetes 04/01/2017   Other fatigue 03/17/2017   Low back pain with radiation 07/23/2014   Right wrist pain 09/05/2012   Meralgia paraesthetica 05/25/2012    PCP: Donato Schultz, DO   REFERRING PROVIDER: Donato Schultz, *   REFERRING DIAG: M54.50 (ICD-10-CM) - Low back pain with radiation   Rationale for Evaluation and Treatment: Rehabilitation  THERAPY  DIAG:  Acute left-sided low back pain with left-sided sciatica  Cramp and spasm  Muscle weakness (generalized)  ONSET DATE: ~03/20/2023  SUBJECTIVE:                                                                                                                                                                                           SUBJECTIVE STATEMENT: 05/12/2023 Pt reports her sciatic pain is intermittent now, when she is reaching for something it bothers her more   From IE: I was  lifting trashcan at work and felt pain in back, that's gotten better but now have pain shooting down L side from butt all the way ankle.   Sometimes when sitting feels like toes goes numb, but not when standing.   Lavenia Atlas been seeing a chiropractor all last year, bit ran out of services last October, started up again in January, he told me to come here 3 times because it feels more like tissue issue than bone issue.   PERTINENT HISTORY:  From MD notes: Persistent pain radiates from the left buttock down the left leg, described as an ache with numbness and burning. Symptoms began on January 20th after lifting a heavy trash can, likely due to nerve impingement from arthritis or muscle issues. Minimal improvement noted with three weeks of chiropractic care. An MRI is needed for further evaluation but requires six weeks of physical therapy first. Physical therapy is necessary to meet insurance requirements for the MRI, alleviate symptoms, and improve function. Avoid heavy lifting and prolonged positions to prevent exacerbation. Refer to physical therapy for at least three weeks. Schedule an MRI after six weeks of physical therapy. Avoid lifting more than 10 pounds. Alternate sitting and standing to avoid prolonged positions. Use ice packs for pain relief. Prescribe methocarbamol for daytime use and refill meloxicam for once-daily use.  PMH:  LBP, GERD, PreDiabetes, obesity PAIN:  Are you having pain? Yes: NPRS scale:  5/10 Pain location:  posterior thigh Pain description:  radiating Aggravating factors: sitting, driving Relieving factors: standing  PRECAUTIONS: None  RED FLAGS: None   WEIGHT BEARING RESTRICTIONS: No  FALLS:  Has patient fallen in last 6 months? No  LIVING ENVIRONMENT: Lives with: lives with their family Lives in: House/apartment Stairs: Yes: External: 2 steps; on left going up Has following equipment at home: None  OCCUPATION: [employer]  PLOF: Independent has young children  PATIENT GOALS: get rid of pain  NEXT MD VISIT: not scheduled yet  OBJECTIVE:   DIAGNOSTIC FINDINGS:   MRI ordered  DG lumbar spine 03/29/2023 FINDINGS: Five non-rib-bearing lumbar vertebra. Normal alignment. Vertebral body heights are normal. No fracture or compression deformity. Mild L4-L5 and L5-S1 disc space narrowing and spurring with progression from prior exam. Minor multilevel facet hypertrophy. No visible pars defects or focal bone abnormalities. The sacroiliac joints are congruent. IUD in the pelvis.   IMPRESSION: 1. Progressive degenerative disc disease at L4-L5 and L5-S1 from 2018. 2. Mild multilevel facet hypertrophy.  PATIENT SURVEYS:  Modified Oswestry 26/50   COGNITION: Overall cognitive status: Within functional limits for tasks assessed     SENSATION: Light touch: Impaired   L4/5 dermatomes  MUSCLE LENGTH: Hamstrings: Right 90 deg; Left 60 deg with increased neural tension and pain in low back  POSTURE: rounded shoulders and forward head  PALPATION: Tenderness/tightness L lumbar paraspinals, L SIJ, L glutes/piriformis  LUMBAR ROM:   AROM eval  Flexion To knees, p!  Extension WNL, p! L side  Right lateral flexion To knees, pulls  Left lateral flexion To knees, pinches L side  Right rotation WNL  Left rotation WNL   (Blank rows = not tested)  LOWER EXTREMITY ROM:      LOWER EXTREMITY MMT:    MMT Right eval Left eval L 05/12/23  Hip  flexion 5 5   Hip extension     Hip abduction 5 5   Hip adduction 5 5   Knee flexion 5 4 5   Knee extension 5 4 5   Ankle dorsiflexion  5 4 5   Ankle plantarflexion 5 5    (Blank rows = not tested)  LUMBAR SPECIAL TESTS:  Straight leg raise test: Positive  FUNCTIONAL TESTS:  5 times sit to stand: 18.2 seconds  GAIT: Distance walked: 7' Assistive device utilized: None Level of assistance: Complete Independence Comments: no significant deviation  TODAY'S TREATMENT:                                                                                                                              DATE:  05/12/23 Nustep L6x40min UE/LE Slump sciatic nerve glide sitting x 10 Bridges RTB 2x10 Clamshells RTB bil 2x10  Therapeutic Activity: Conventional Deadlifts with 5lb weights  x 10 Conventional Deadlifts with one arm crossbody reach 5lb weights x 10 bil LE strength test  04/29/23  Neuromuscular Reeducation:  to decrease nerve irritability and centralize symptoms.  Repeated side glides x 10  Repeated lumbar extension at wall x 10  Prone press-ups with R lumbar overpressure x 10 R UPA mobs to lumbar spine Seated anterior tilts with black strap Demo sciatic nerve glide in sitting Review of HEP Therapeutic Exercise: to improve strength and mobility.  Demo, verbal and tactile cues throughout for technique. Prone leg extensions x 10 r/l, x 5 r/l Supine bridges 2 x 15 3 sec hold Supine marches with TrA contraction x 10    04/26/23 Therapeutic Exercise: to improve strength and mobility.  Demo, verbal and tactile cues throughout for technique. Nustep L5x4 min Cat/cow x 10 bil Supine LTR x 10  Neuromuscular Reeducation: to centralize symptoms and decrease nerve irritability Press ups x 10 Prone knee flexion x 10  Prone hip extension x 10  Quadruped hip extension x 10 bil Bridges 2x10 3 sec hold   04/14/23 Therapeutic Exercise: to improve strength and mobility.  Demo, verbal and  tactile cues throughout for technique. Nustep L3x3 min Supine passive figure 4 stretch x 30" Supine KTOS stretch x 30" Supine LTR to L (non-painful) side x 10  Neuromuscular Reeducation: to centralize symptoms and decrease nerve irritability POE 2 x 1 min each Prone press ups x 10 Prone knee flexion x 10  Bridges x 10 Standing lumbar extension x 10     PATIENT EDUCATION:  Education details: HEP review and update Person educated: Patient Education method: Explanation, Demonstration, and Handouts Education comprehension: verbalized understanding and returned demonstration  HOME EXERCISE PROGRAM: Access Code: [email or web address] URL: [email or web address] Date: 05/12/2023 Prepared by: Verta Ellen  Exercises - Left Standing Lateral Shift Correction at Wall - Repetitions  - 3 x daily - 7 x weekly - 1 sets - 10 reps - Standing Lumbar Extension at Wall - Forearms  - 3 x daily - 7 x weekly - 1 sets - 10 reps - Static Prone on Elbows  - 1 x daily - 7 x weekly - 1 sets - 1 reps - Standing Lumbar Extension with Counter  - 3 x daily - 7 x  weekly - 1 sets - 10 reps - Prone Press Up  - 3 x daily - 7 x weekly - 1-2 sets - 10 reps - Beginner Prone Single Leg Raise  - 1 x daily - 7 x weekly - 1-2 sets - 10 reps - Supine Lower Trunk Rotation  - 1 x daily - 7 x weekly - 2 sets - 10 reps - Beginner Front Arm Support  - 1 x daily - 7 x weekly - 2 sets - 10 reps - Seated Sciatic Tensioner  - 3 x daily - 7 x weekly - 1 sets - 10 reps - Seated Anterior Pelvic Tilt  - 1 x daily - 7 x weekly - 1 sets - 10 reps - Supine Bridge with Resistance Band  - 1 x daily - 7 x weekly - 2 sets - 10 reps - Clamshell with Resistance  - 1 x daily - 7 x weekly - 2 sets - 10 reps  ASSESSMENT:  CLINICAL IMPRESSION: Pt arrived 15 min late today. Had complaints of increased radicular pain with bending over to pick things up. Reviewed her form and corrected her body mechanics and afterwards reported no pain.  Strength test show improvement in knee strength and tib anterior strength. Caralyn Twining continues to demonstrate potential for improvement and would benefit from continued skilled therapy to address impairments.      OBJECTIVE IMPAIRMENTS: decreased activity tolerance, decreased strength, increased fascial restrictions, increased muscle spasms, impaired sensation, and pain.   ACTIVITY LIMITATIONS: carrying, lifting, bending, sitting, standing, squatting, sleeping, stairs, and locomotion level  PARTICIPATION LIMITATIONS: meal prep, cleaning, laundry, driving, shopping, community activity, and occupation  PERSONAL FACTORS: Past/current experiences, Time since onset of injury/illness/exacerbation, and 1-2 comorbidities: previous history of LBP, GERD, PreDiabetes, obesity  are also affecting patient's functional outcome.   REHAB POTENTIAL: Good  CLINICAL DECISION MAKING: Evolving/moderate complexity  EVALUATION COMPLEXITY: Moderate   GOALS: Goals reviewed with patient? Yes  SHORT TERM GOALS: Target date: 04/22/2023   Patient will be independent with initial HEP.  Baseline: given Goal status: MET- 04/26/23  2.   Patient will report centralization of radicular symptoms.  Baseline: numbness L toes Goal status: IN PROGRESS- 04/26/23 pain goes down posterior    LONG TERM GOALS: Target date: 05/20/2023   Patient will be independent with advanced/ongoing HEP to improve outcomes and carryover.  Baseline:  Goal status: IN PROGRESS  2.  Patient will report 75% improvement in low back pain to improve QOL.  Baseline:  Goal status: IN PROGRESS   3.  Patient will demonstrate full pain free lumbar ROM to perform ADLs.   Baseline: see objective Goal status: IN PROGRESS 04/29/23- reports no pain with lumbar AROM today  4.  Patient will demonstrate improved functional strength as demonstrated by 5/5 LLE all myotomes. Baseline: see objective Goal status: IN PROGRESS  5.  Patient will report  at least 6 points improvement on modified Oswestry to demonstrate improved functional ability.  Baseline: 26/50 Goal status: IN PROGRESS   6.  Patient will tolerate 60 min of sitting without increased pain to eat/drive. Baseline: limited to 30 min max, toes go numb Goal status: IN PROGRESS- 05/12/23 able to drive for an hour last week no pain   PLAN:  PT FREQUENCY: 2x/week  PT DURATION: 6 weeks  PLANNED INTERVENTIONS: 97110-Therapeutic exercises, 97530- Therapeutic activity, 97112- Neuromuscular re-education, 97535- Self Care, 62130- Manual therapy, 97014- Electrical stimulation (unattended), [id number]- Electrical stimulation (manual), [id number]- Ultrasound, 86578- Traction (mechanical), Patient/Family education, Dry  Needling, Joint mobilization, Joint manipulation, Spinal manipulation, Spinal mobilization, Cryotherapy, and Moist heat.  PLAN FOR NEXT SESSION: continue extension exercises, update goals.     Darleene Cleaver, PTA 05/12/2023, 9:31 AM

## 2023-05-18 ENCOUNTER — Ambulatory Visit: Admitting: Physical Therapy

## 2023-05-26 DIAGNOSIS — Z630 Problems in relationship with spouse or partner: Secondary | ICD-10-CM | POA: Diagnosis not present

## 2023-05-26 DIAGNOSIS — F4323 Adjustment disorder with mixed anxiety and depressed mood: Secondary | ICD-10-CM | POA: Diagnosis not present

## 2023-05-27 ENCOUNTER — Encounter: Payer: Self-pay | Admitting: Family

## 2023-05-27 ENCOUNTER — Ambulatory Visit (HOSPITAL_BASED_OUTPATIENT_CLINIC_OR_DEPARTMENT_OTHER)
Admission: RE | Admit: 2023-05-27 | Discharge: 2023-05-27 | Disposition: A | Discharge status: Home / Self Care | Source: Ambulatory Visit | Attending: Family | Admitting: Family

## 2023-05-27 ENCOUNTER — Ambulatory Visit: Admitting: Family

## 2023-05-27 ENCOUNTER — Encounter: Payer: Self-pay | Admitting: Pediatrics

## 2023-05-27 VITALS — BP 138/82 | HR 88 | Ht 66.0 in | Wt 259.6 lb

## 2023-05-27 DIAGNOSIS — L659 Nonscarring hair loss, unspecified: Secondary | ICD-10-CM | POA: Diagnosis not present

## 2023-05-27 DIAGNOSIS — M25562 Pain in left knee: Secondary | ICD-10-CM | POA: Diagnosis not present

## 2023-05-27 DIAGNOSIS — M25462 Effusion, left knee: Secondary | ICD-10-CM | POA: Diagnosis not present

## 2023-05-27 DIAGNOSIS — M25561 Pain in right knee: Secondary | ICD-10-CM | POA: Diagnosis not present

## 2023-05-27 DIAGNOSIS — M25461 Effusion, right knee: Secondary | ICD-10-CM | POA: Diagnosis not present

## 2023-05-27 DIAGNOSIS — R5383 Other fatigue: Secondary | ICD-10-CM

## 2023-05-27 DIAGNOSIS — M1711 Unilateral primary osteoarthritis, right knee: Secondary | ICD-10-CM | POA: Diagnosis not present

## 2023-05-27 DIAGNOSIS — E559 Vitamin D deficiency, unspecified: Secondary | ICD-10-CM

## 2023-05-27 DIAGNOSIS — R7309 Other abnormal glucose: Secondary | ICD-10-CM | POA: Diagnosis not present

## 2023-05-27 DIAGNOSIS — M1712 Unilateral primary osteoarthritis, left knee: Secondary | ICD-10-CM | POA: Diagnosis not present

## 2023-05-27 MED ORDER — MELOXICAM 15 MG PO TABS: 15.0000 mg | ORAL_TABLET | Freq: Every day | ORAL | 0 refills | Status: DC

## 2023-05-27 NOTE — Progress Notes (Signed)
 Jody Bryan is a 43 y.o. female with the following history as recorded in Epic[facility]:  Patient Active Problem List   Diagnosis Date Noted   Snoring 02/01/2023   Hx gestational diabetes 10/28/2022   Suspected sleep apnea 10/28/2022   Abnormal metabolism 10/28/2022   Leukocytosis 06/29/2022   Urinary retention 05/28/2022   Allergic conjunctivitis of both eyes 05/28/2022   Urinary frequency 05/28/2022   Contraception, device intrauterine 05/18/2022   Vitamin B12 deficiency 05/18/2022   Inguinal pain 08/12/2021   Class 3 severe obesity with serious comorbidity and body mass index (BMI) of 40.0 to 44.9 in adult (HCC) 07/11/2021   De Quervain's tenosynovitis, bilateral 01/17/2021   Renal cyst 12/22/2019   Bulging lumbar disc 12/22/2019   Preventative health care 08/09/2017   Vitamin D deficiency 04/01/2017   Prediabetes 04/01/2017   Other fatigue 03/17/2017   Low back pain with radiation 07/23/2014   Right wrist pain 09/05/2012   Meralgia paraesthetica 05/25/2012    Current Outpatient Medications  Medication Sig Dispense Refill   meloxicam (MOBIC) 15 MG tablet Take 1 tablet (15 mg total) by mouth daily. 30 tablet 0   Vitamin D, Ergocalciferol, (DRISDOL) 1.25 MG (50000 UNIT) CAPS capsule Take 1 capsule (50,000 Units total) by mouth every 7 (seven) days. 12 capsule 1   levonorgestrel (MIRENA) 20 MCG/DAY IUD 1 each by Intrauterine route once for 1 dose. 1 each 0   No current facility-administered medications for this visit.    Allergies: Patient has no known allergies.  Past Medical History:  Diagnosis Date   Bilateral leg edema    Chicken pox    Depression    Drug use    Dyspnea    GERD (gastroesophageal reflux disease)    Low back pain     Past Surgical History:  Procedure Laterality Date   WISDOM TOOTH EXTRACTION  08/2019    Family History  Problem Relation Age of Onset   Hypertension Mother    Obesity Mother    Depression Mother    Hypertension Father     Depression Father    Sleep apnea Father    Drug abuse Father    Obesity Father    Cancer Father    Alcohol abuse Sister    Stroke Sister    Heart disease Maternal Grandmother    Alcohol abuse Maternal Grandfather    Arthritis Paternal Grandmother    Cancer Paternal Grandmother        breast    Social History   Tobacco Use   Smoking status: Former    Types: Cigarettes   Smokeless tobacco: Never  Substance Use Topics   Alcohol use: Yes    Alcohol/week: 0.0 standard drinks of alcohol    Comment: social    Subjective:   Bilateral knee pain x 1 week; has been trying to walk more regularly recently- 45 minutes/ 1 hour 3 x per week; no swelling noted in knees; did an incline with a treadmill at [facility] for about 25-35 minutes prior to onset of symptoms;   Also requesting to have labs updated as had increased fatigue recently; wanting to check her hormone levels;     Objective:  Vitals:   05/27/23 1415  BP: 138/82  Pulse: 88  SpO2: 98%  Weight: 259 lb 9.6 oz (117.8 kg)  Height: 5\' 6"  (1.676 m)    General: Well developed, well nourished, in no acute distress  Skin : Warm and dry.  Head: Normocephalic and atraumatic  Lungs: Respirations unlabored;  clear to auscultation bilaterally without wheeze, rales, rhonchi  CVS exam: normal rate and regular rhythm.  Musculoskeletal: No deformities; no active joint inflammation  Extremities: No edema, cyanosis, clubbing  Vessels: Symmetric bilaterally  Neurologic: Alert and oriented; speech intact; face symmetrical; moves all extremities well; CNII-XII intact without focal deficit   Assessment:  1. Pain in both knees, unspecified chronicity   2. Other fatigue   3. Hair loss   4. Elevated glucose   5. Vitamin D deficiency     Plan:  ? Injury; will update X-rays today due to sudden onset of symptoms; re-start Mobic 15 mg daily; to consider referral to orthopedist if symptoms persist; Will update labs today as requested- follow up to  be determined; discussed symptoms of peri-menopause vs menopause- does sound like patient could be peri-menopausal;   No follow-ups on file.  Orders Placed This Encounter  Procedures   DG Knee 1-2 Views Left    Standing Status:   Future    Number of Occurrences:   1    Expiration Date:   11/27/2023    Reason for Exam (SYMPTOM  OR DIAGNOSIS REQUIRED):   knee pain    Is the patient pregnant?:   No    Preferred imaging location?:   [facility]   DG Knee 1-2 Views Right    Standing Status:   Future    Number of Occurrences:   1    Expiration Date:   11/27/2023    Reason for Exam (SYMPTOM  OR DIAGNOSIS REQUIRED):   knee pain    Is the patient pregnant?:   No    Preferred imaging location?:   [facility]   CBC with Differential/Platelet   Comp Met (CMET)   TSH   Hemoglobin A1c   Vitamin D (25 hydroxy)   FSH   LH    Requested Prescriptions   Signed Prescriptions Disp Refills   meloxicam (MOBIC) 15 MG tablet 30 tablet 0    Sig: Take 1 tablet (15 mg total) by mouth daily.

## 2023-05-28 ENCOUNTER — Encounter: Payer: Self-pay | Admitting: Family

## 2023-05-28 LAB — FOLLICLE STIMULATING HORMONE: FSH: 17.1 m[IU]/mL

## 2023-05-28 LAB — COMPREHENSIVE METABOLIC PANEL WITH GFR
ALT: 12 U/L (ref 0–35)
AST: 12 U/L (ref 0–37)
Albumin: 4.1 g/dL (ref 3.5–5.2)
Alkaline Phosphatase: 76 U/L (ref 39–117)
BUN: 14 mg/dL (ref 6–23)
CO2: 27 meq/L (ref 19–32)
Calcium: 9.3 mg/dL (ref 8.4–10.5)
Chloride: 106 meq/L (ref 96–112)
Creatinine, Ser: 0.73 mg/dL (ref 0.40–1.20)
GFR: 99.45 mL/min (ref >60.00)
Glucose, Bld: 77 mg/dL (ref 70–99)
Potassium: 4.4 meq/L (ref 3.5–5.1)
Sodium: 141 meq/L (ref 135–145)
Total Bilirubin: 0.6 mg/dL (ref 0.2–1.2)
Total Protein: 6.8 g/dL (ref 6.0–8.3)

## 2023-05-28 LAB — CBC WITH DIFFERENTIAL/PLATELET
Basophils Absolute: 0.1 10*3/uL (ref 0.0–0.1)
Basophils Relative: 0.9 % (ref 0.0–3.0)
Eosinophils Absolute: 0.2 10*3/uL (ref 0.0–0.7)
Eosinophils Relative: 1.6 % (ref 0.0–5.0)
HCT: 42.8 % (ref 36.0–46.0)
Hemoglobin: 14.2 g/dL (ref 12.0–15.0)
Lymphocytes Relative: 24.4 % (ref 12.0–46.0)
Lymphs Abs: 2.7 10*3/uL (ref 0.7–4.0)
MCHC: 33.2 g/dL (ref 30.0–36.0)
MCV: 92.2 fl (ref 78.0–100.0)
Monocytes Absolute: 1 10*3/uL (ref 0.1–1.0)
Monocytes Relative: 9.2 % (ref 3.0–12.0)
Neutro Abs: 7.1 10*3/uL (ref 1.4–7.7)
Neutrophils Relative %: 63.9 % (ref 43.0–77.0)
Platelets: 364 10*3/uL (ref 150.0–400.0)
RBC: 4.65 Mil/uL (ref 3.87–5.11)
RDW: 13.4 % (ref 11.5–15.5)
WBC: 11.1 10*3/uL — ABNORMAL HIGH (ref 4.0–10.5)

## 2023-05-28 LAB — TSH: TSH: 1.45 u[IU]/mL (ref 0.35–5.50)

## 2023-05-28 LAB — VITAMIN D 25 HYDROXY (VIT D DEFICIENCY, FRACTURES): VITD: 23.57 ng/mL — ABNORMAL LOW (ref 30.00–100.00)

## 2023-05-28 LAB — LUTEINIZING HORMONE: LH: 74.23 m[IU]/mL

## 2023-05-29 LAB — HEMOGLOBIN A1C: Hgb A1c MFr Bld: 5.9 % (ref 4.6–6.5)

## 2023-05-30 ENCOUNTER — Other Ambulatory Visit: Payer: Self-pay | Admitting: Family Medicine

## 2023-05-30 DIAGNOSIS — E559 Vitamin D deficiency, unspecified: Secondary | ICD-10-CM

## 2023-05-31 ENCOUNTER — Other Ambulatory Visit: Payer: Self-pay | Admitting: Family

## 2023-05-31 MED ORDER — VITAMIN D (ERGOCALCIFEROL) 1.25 MG (50000 UNIT) PO CAPS
50000.0000 [IU] | ORAL_CAPSULE | ORAL | 0 refills | Status: DC
Start: 2023-05-31 — End: 2023-06-09

## 2023-06-09 ENCOUNTER — Ambulatory Visit (INDEPENDENT_AMBULATORY_CARE_PROVIDER_SITE_OTHER): Admitting: Family Medicine

## 2023-06-09 ENCOUNTER — Encounter (INDEPENDENT_AMBULATORY_CARE_PROVIDER_SITE_OTHER): Payer: Self-pay | Admitting: Family Medicine

## 2023-06-09 VITALS — BP 135/84 | HR 83 | Temp 98.3°F | Ht 66.0 in | Wt 253.0 lb

## 2023-06-09 DIAGNOSIS — R7303 Prediabetes: Secondary | ICD-10-CM

## 2023-06-09 DIAGNOSIS — E669 Obesity, unspecified: Secondary | ICD-10-CM | POA: Diagnosis not present

## 2023-06-09 DIAGNOSIS — Z6841 Body Mass Index (BMI) 40.0 and over, adult: Secondary | ICD-10-CM

## 2023-06-09 NOTE — Progress Notes (Signed)
 Office: [phone]  /  Fax: [phone]  WEIGHT SUMMARY AND BIOMETRICS  Anthropometric Measurements Height: 5\' 6"  (1.676 m) Weight: 253 lb (114.8 kg) BMI (Calculated): 40.85 Weight at Last Visit: 246 lb Weight Lost Since Last Visit: 0 Weight Gained Since Last Visit: 7 lb Starting Weight: 252 lb Total Weight Loss (lbs): 0 lb (0 kg)   Body Composition  Body Fat %: 47.2 % Fat Mass (lbs): 119.6 lbs Muscle Mass (lbs): 127 lbs Total Body Water (lbs): 93.8 lbs Visceral Fat Rating : 14   Other Clinical Data Fasting: no Labs: no Today's Visit #: 3 Starting Date: 10/28/22    Chief Complaint: OBESITY    History of Present Illness Jody Bryan "Olga Coaster" is a 43 year old female with obesity and prediabetes who presents for obesity treatment plan discussion. She was previously under the care of Dr. Rikki Spearing for obesity management and it has been 5 months since her last visit. She feels like she is now at a point where she can concentrate on her weight loss efforts again.  She has a history of obesity and prediabetes, with a weight gain of seven pounds over the last five months. She was previously following a category two eating plan but has not adhered to it recently. She is attempting to increase her physical activity by walking for about forty-five minutes twice a week.  She sustained a recent back injury, resulting in compensatory knee pain. She is attending physical therapy, which she considers part of her workout routine. She is concerned about the possibility of overexerting herself and exacerbating her injury.   She works as a Firefighter, which presents challenges with food temptations, particularly with wedding cakes. She notes struggling with cravings and temptations.  Her HgA1C has increased, worsening her prediabetes and is negatively affecting her weight loss efforts.      PHYSICAL EXAM:  Blood pressure 135/84, pulse 83, temperature 98.3 F (36.8 C), height 5'  6" (1.676 m), weight 253 lb (114.8 kg), SpO2 98%. Body mass index is 40.84 kg/m.  DIAGNOSTIC DATA REVIEWED:  BMET    Component Value Date/Time   NA 141 05/27/2023 1443   NA 142 10/28/2022 0925   K 4.4 05/27/2023 1443   CL 106 05/27/2023 1443   CO2 27 05/27/2023 1443   GLUCOSE 77 05/27/2023 1443   BUN 14 05/27/2023 1443   BUN 13 10/28/2022 0925   CREATININE 0.73 05/27/2023 1443   CREATININE 0.82 06/08/2014 1627   CALCIUM 9.3 05/27/2023 1443   GFRNONAA >60 08/27/2020 0745   GFRAA 133 02/22/2020 1039   Lab Results  Component Value Date   HGBA1C 5.9 05/27/2023   HGBA1C 5.7 06/11/2015   Lab Results  Component Value Date   INSULIN 11.6 10/28/2022   INSULIN 25.7 (H) 03/17/2017   Lab Results  Component Value Date   TSH 1.45 05/27/2023   CBC    Component Value Date/Time   WBC 11.1 (H) 05/27/2023 1443   RBC 4.65 05/27/2023 1443   HGB 14.2 05/27/2023 1443   HGB 12.4 06/19/2020 0829   HCT 42.8 05/27/2023 1443   HCT 36.2 06/19/2020 0829   PLT 364.0 05/27/2023 1443   PLT 355 06/19/2020 0829   MCV 92.2 05/27/2023 1443   MCV 94 06/19/2020 0829   MCH 33.0 08/27/2020 0745   MCHC 33.2 05/27/2023 1443   RDW 13.4 05/27/2023 1443   RDW 12.9 06/19/2020 0829   Iron Studies No results found for: "IRON", "TIBC", "FERRITIN", "IRONPCTSAT" Lipid Panel  Component Value Date/Time   CHOL 173 05/18/2022 1543   CHOL 156 03/17/2017 1042   TRIG 127.0 05/18/2022 1543   HDL 61.70 05/18/2022 1543   HDL 47 03/17/2017 1042   CHOLHDL 3 05/18/2022 1543   VLDL 25.4 05/18/2022 1543   LDLCALC 86 05/18/2022 1543   LDLCALC 94 03/17/2017 1042   Hepatic Function Panel     Component Value Date/Time   PROT 6.8 05/27/2023 1443   PROT 7.0 10/28/2022 0925   ALBUMIN 4.1 05/27/2023 1443   ALBUMIN 4.4 10/28/2022 0925   AST 12 05/27/2023 1443   ALT 12 05/27/2023 1443   ALKPHOS 76 05/27/2023 1443   BILITOT 0.6 05/27/2023 1443   BILITOT 0.8 10/28/2022 0925   BILIDIR 0.2 06/11/2015 1024    IBILI 0.6 06/08/2014 1627      Component Value Date/Time   TSH 1.45 05/27/2023 1443   Nutritional Lab Results  Component Value Date   VD25OH 23.57 (L) 05/27/2023   VD25OH 17.44 (L) 05/18/2022   VD25OH 24.61 (L) 08/12/2021     Assessment and Plan Assessment & Plan Obesity She has gained seven pounds over the last five months and is restarting her obesity treatment plan. Prediabetes complicates weight loss due to insulin resistance. She has chosen to restart the category two eating plan, designed to reduce insulin production and facilitate weight loss. She is engaging in physical activity, including walking and physical therapy, despite a recent back injury affecting her knees. The eating plan is emphasized as a form of medicine that must be adhered to daily for effective results. - Provide a copy of the category two eating plan - Emphasize adherence to the eating plan, stressing the importance of consuming all prescribed foods daily - Advise continuation of physical therapy and walking as tolerated, following physical therapist's guidance  Prediabetes Her prediabetes persists, contributing to difficulties in weight management. The condition involves fluctuating blood glucose levels and increased insulin production, promoting fat storage. The eating plan is intended to stabilize blood glucose levels and reduce insulin production, aiding in weight loss and preventing progression to diabetes. - Monitor adherence to the eating plan to assess its impact on hunger and cravings - Re-evaluate in two to three weeks to determine the effectiveness of the current plan and consider further interventions if necessary     I have personally spent 320 minutes total time today in reviewing history,discussing eating plans and nutritional counseling as well as discussing the mechanism of prediabetes and how it affects obesity as well as documenting for this visit.  F/U 2-3 weeks   Quillian Quince, MD

## 2023-06-17 ENCOUNTER — Encounter: Payer: Self-pay | Admitting: Family Medicine

## 2023-06-17 ENCOUNTER — Ambulatory Visit: Admitting: Family Medicine

## 2023-06-17 ENCOUNTER — Ambulatory Visit (AMBULATORY_SURGERY_CENTER): Admitting: *Deleted

## 2023-06-17 VITALS — BP 122/82 | HR 80 | Temp 98.6°F | Resp 18 | Ht 66.0 in | Wt 252.2 lb

## 2023-06-17 VITALS — Ht 66.0 in | Wt 252.0 lb

## 2023-06-17 DIAGNOSIS — M25562 Pain in left knee: Secondary | ICD-10-CM

## 2023-06-17 DIAGNOSIS — M25561 Pain in right knee: Secondary | ICD-10-CM | POA: Diagnosis not present

## 2023-06-17 DIAGNOSIS — Z1211 Encounter for screening for malignant neoplasm of colon: Secondary | ICD-10-CM

## 2023-06-17 DIAGNOSIS — G8929 Other chronic pain: Secondary | ICD-10-CM | POA: Diagnosis not present

## 2023-06-17 MED ORDER — SUTAB 1479-225-188 MG PO TABS: 12.0000 | ORAL_TABLET | ORAL | 0 refills | Status: DC

## 2023-06-17 NOTE — Progress Notes (Signed)
 Established Patient Office Visit  Subjective   Patient ID: Jody Bryan, female    DOB: 03-21-1980  Age: 43 y.o. MRN: [id number]  Chief Complaint  Patient presents with   Knee Pain    Pt states pain is not improved and meloxicam is not working   Follow-up    HPI Discussed the use of AI scribe software for clinical note transcription with the patient, who gave verbal consent to proceed.  History of Present Illness Discussed the use of AI scribe software for clinical note transcription with the patient, who gave verbal consent to proceed.  History of Present Illness Jody Bryan "Jody Bryan" is a 43 year old female who presents with knee pain and concerns about potential meniscus issues.  She experiences knee pain primarily on the sides, with a specific tender spot. The pain is more severe in one knee compared to the other. She feels as though her knees might give out while walking, although this has not occurred. The pain worsens when standing up after sitting for a while, but movement helps alleviate it.  She has a history of mild arthritis and a small amount of fluid in the knee, as seen on previous x-rays. She has been using meloxicam 15 mg daily for two weeks without relief. She has also tried icing the knee and using lidocaine patches, which were ineffective due to issues with the patch staying in place. She has received injections in the past, which provided temporary relief, and is currently awaiting gel injections.  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 No pain when the knee is touched, except for a specific tender spot. Reports pain in the back of the knee when standing up after sitting.    Jody Bryan "Jody Bryan" is a 43 year old female who presents with knee pain and concerns about potential meniscus issues.  She experiences knee pain primarily on  the sides, with a specific tender spot. The pain is more severe in one knee compared to the other. She feels as though her knees might give out while walking, although this has not occurred. The pain worsens when standing up after sitting for a while, but movement helps alleviate it.  She has a history of mild arthritis and a small amount of fluid in the knee, as seen on previous x-rays. She has been using meloxicam 15 mg daily for two weeks without relief. She has also tried icing the knee and using lidocaine patches, which were ineffective due to issues with the patch staying in place. She has received injections in the past, which provided temporary relief, and is currently awaiting gel injections.  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 No pain when the knee is touched, except for a specific tender spot. Reports pain in the back of the knee when standing up after sitting.   Patient Active Problem List   Diagnosis Date Noted   Snoring 02/01/2023   Hx gestational diabetes 10/28/2022   Suspected sleep apnea 10/28/2022   Abnormal metabolism 10/28/2022   Leukocytosis 06/29/2022   Urinary retention 05/28/2022   Allergic conjunctivitis of both eyes 05/28/2022   Urinary frequency 05/28/2022   Contraception, device intrauterine 05/18/2022   Vitamin B12 deficiency 05/18/2022   Inguinal pain 08/12/2021   Class 3 severe obesity with serious comorbidity and  body mass index (BMI) of 40.0 to 44.9 in adult [facility]) 07/11/2021   De Quervain's tenosynovitis, bilateral 01/17/2021   Renal cyst 12/22/2019   Bulging lumbar disc 12/22/2019   Preventative health care 08/09/2017   Vitamin D deficiency 04/01/2017   Prediabetes 04/01/2017   Other fatigue 03/17/2017   Low back pain with radiation 07/23/2014   Right wrist pain 09/05/2012   Meralgia paraesthetica 05/25/2012   Past Medical  History:  Diagnosis Date   Bilateral leg edema    Chicken pox    Depression    Drug use    Dyspnea    GERD (gastroesophageal reflux disease)    Low back pain    Past Surgical History:  Procedure Laterality Date   WISDOM TOOTH EXTRACTION  08/2019   Social History   Tobacco Use   Smoking status: Former    Types: Cigarettes   Smokeless tobacco: Never  Vaping Use   Vaping status: Never Used  Substance Use Topics   Alcohol use: Yes    Alcohol/week: 0.0 standard drinks of alcohol    Comment: social   Drug use: Not Currently   Social History   Socioeconomic History   Marital status: Single    Spouse name: Not on file   Number of children: 1   Years of education: Not on file   Highest education level: Bachelor's degree (e.g., BA, AB, BS)  Occupational History   Occupation:  call center   Occupation: Stay at home  Tobacco Use   Smoking status: Former    Types: Cigarettes   Smokeless tobacco: Never  Vaping Use   Vaping status: Never Used  Substance and Sexual Activity   Alcohol use: Yes    Alcohol/week: 0.0 standard drinks of alcohol    Comment: social   Drug use: Not Currently   Sexual activity: Yes    Birth control/protection: I.U.D.  Other Topics Concern   Not on file  Social History Narrative   Exercise ---  5 days a week for 40 min   Social Drivers of Health   Financial Resource Strain: Low Risk  (03/28/2023)   Overall Financial Resource Strain (CARDIA)    Difficulty of Paying Living Expenses: Not very hard  Food Insecurity: No Food Insecurity (03/28/2023)   Hunger Vital Sign    Worried About Running Out of Food in the Last Year: Never true    Ran Out of Food in the Last Year: Never true  Transportation Needs: No Transportation Needs (03/28/2023)   PRAPARE - Administrator, Civil Service (Medical): No    Lack of Transportation (Non-Medical): No  Physical Activity: Unknown (03/28/2023)   Exercise Vital Sign    Days of Exercise per Week: 0 days     Minutes of Exercise per Session: Not on file  Stress: Stress Concern Present (03/28/2023)   [employer] of Occupational Health - Occupational Stress Questionnaire    Feeling of Stress : Very much  Social Connections: Moderately Integrated (03/28/2023)   Social Connection and Isolation Panel [NHANES]    Frequency of Communication with Friends and Family: More than three times a week    Frequency of Social Gatherings with Friends and Family: Never    Attends Religious Services: More than 4 times per year    Active Member of [employer] or Organizations: No    Attends Engineer, structural: More than 4 times per year    Marital Status: Never married  Intimate Partner Violence: Not on file  Family Status  Relation Name Status   Mother  Alive   Father  Alive   Sister  (Not Specified)   MGM  (Not Specified)   MGF  (Not Specified)   PGM  Alive  No partnership data on file   Family History  Problem Relation Age of Onset   Hypertension Mother    Obesity Mother    Depression Mother    Hypertension Father    Depression Father    Sleep apnea Father    Drug abuse Father    Obesity Father    Cancer Father    Alcohol abuse Sister    Stroke Sister    Heart disease Maternal Grandmother    Alcohol abuse Maternal Grandfather    Arthritis Paternal Grandmother    Cancer Paternal Grandmother        breast   No Known Allergies    Review of Systems  Constitutional:  Negative for fever and malaise/fatigue.  HENT:  Negative for congestion.   Eyes:  Negative for blurred vision.  Respiratory:  Negative for cough and shortness of breath.   Cardiovascular:  Negative for chest pain, palpitations and leg swelling.  Gastrointestinal:  Negative for abdominal pain, blood in stool, nausea and vomiting.  Genitourinary:  Negative for dysuria and frequency.  Musculoskeletal:  Positive for joint pain. Negative for back pain and falls.  Skin:  Negative for rash.  Neurological:  Negative for  dizziness, loss of consciousness and headaches.  Endo/Heme/Allergies:  Negative for environmental allergies.  Psychiatric/Behavioral:  Negative for depression. The patient is not nervous/anxious.       Objective:     BP 122/82 (BP Location: Right Arm, Patient Position: Sitting, Cuff Size: Large)   Pulse 80   Temp 98.6 F (37 C) (Oral)   Resp 18   Ht 5\' 6"  (1.676 m)   Wt 252 lb 3.2 oz (114.4 kg)   SpO2 97%   BMI 40.71 kg/m  BP Readings from Last 3 Encounters:  06/17/23 122/82  06/09/23 135/84  05/27/23 138/82   Wt Readings from Last 3 Encounters:  06/17/23 252 lb 3.2 oz (114.4 kg)  06/09/23 253 lb (114.8 kg)  05/27/23 259 lb 9.6 oz (117.8 kg)   SpO2 Readings from Last 3 Encounters:  06/17/23 97%  06/09/23 98%  05/27/23 98%      Physical Exam Vitals and nursing note reviewed.  Constitutional:      General: She is not in acute distress.    Appearance: Normal appearance. She is well-developed.  HENT:     Head: Normocephalic and atraumatic.  Eyes:     General: No scleral icterus.       Right eye: No discharge.        Left eye: No discharge.  Cardiovascular:     Rate and Rhythm: Normal rate and regular rhythm.     Heart sounds: No murmur heard. Pulmonary:     Effort: Pulmonary effort is normal. No respiratory distress.     Breath sounds: Normal breath sounds.  Musculoskeletal:        General: Tenderness present. Normal range of motion.     Cervical back: Normal range of motion and neck supple.     Right knee: No erythema or crepitus. Tenderness present over the medial joint line.     Left knee: No erythema or crepitus. Tenderness present over the medial joint line.     Right lower leg: No edema.     Left lower leg: No edema.  Skin:    General: Skin is warm and dry.  Neurological:     Mental Status: She is alert and oriented to person, place, and time.  Psychiatric:        Mood and Affect: Mood normal.        Behavior: Behavior normal.        Thought  Content: Thought content normal.        Judgment: Judgment normal.      No results found for any visits on 06/17/23.  Last CBC Lab Results  Component Value Date   WBC 11.1 (H) 05/27/2023   HGB 14.2 05/27/2023   HCT 42.8 05/27/2023   MCV 92.2 05/27/2023   MCH 33.0 08/27/2020   RDW 13.4 05/27/2023   PLT 364.0 05/27/2023   Last metabolic panel Lab Results  Component Value Date   GLUCOSE 77 05/27/2023   NA 141 05/27/2023   K 4.4 05/27/2023   CL 106 05/27/2023   CO2 27 05/27/2023   BUN 14 05/27/2023   CREATININE 0.73 05/27/2023   GFR 99.45 05/27/2023   CALCIUM 9.3 05/27/2023   PROT 6.8 05/27/2023   ALBUMIN 4.1 05/27/2023   LABGLOB 2.6 10/28/2022   AGRATIO 1.3 09/04/2020   BILITOT 0.6 05/27/2023   ALKPHOS 76 05/27/2023   AST 12 05/27/2023   ALT 12 05/27/2023   ANIONGAP 15 08/27/2020   Last lipids Lab Results  Component Value Date   CHOL 173 05/18/2022   HDL 61.70 05/18/2022   LDLCALC 86 05/18/2022   TRIG 127.0 05/18/2022   CHOLHDL 3 05/18/2022   Last hemoglobin A1c Lab Results  Component Value Date   HGBA1C 5.9 05/27/2023   Last thyroid functions Lab Results  Component Value Date   TSH 1.45 05/27/2023   T3TOTAL 149 03/17/2017   T4TOTAL 7.6 08/12/2021      The 10-year ASCVD risk score (Arnett DK, et al., 2019) is: 0.6%    Assessment & Plan:   Problem List Items Addressed This Visit   None Visit Diagnoses       Chronic pain of both knees    -  Primary   Relevant Orders   Ambulatory referral to Orthopedic Surgery     Assessment and Plan Assessment & Plan Knee Pain   She experiences bilateral knee pain, predominantly in the right knee, with discomfort localized to the sides and occasionally the back. X-rays show mild arthritis and minimal effusion. Meloxicam 15 mg for two weeks was ineffective, and previous steroid injections provided only temporary relief. Differential diagnosis includes medial meniscus involvement. It is important to maintain  activity to prevent symptom exacerbation. If pain intensifies, she should seek urgent care for an immediate steroid injection and inform her of previous x-rays and current medications. A referral to orthopedics is necessary for further evaluation and management. Continued icing of the knee is advised. Topical Voltaren gel and aspirin cream with lidocaine may offer symptomatic relief.  Diabetes Screening   Symptoms related to diabetes, particularly concerning foot health and potential neuropathy, were discussed. Monitoring for neuropathy signs, such as loss of sensation, is crucial to prevent complications like infections and unnoticed injuries. Regular foot checks are essential to avoid these issues.    No follow-ups on file.    Donato Schultz, DO

## 2023-06-17 NOTE — Progress Notes (Signed)
 Pt's name and DOB verified at the beginning of the pre-visit wit 2 identifiers   Pt denies any difficulty with ambulating,sitting, laying down or rolling side to side  Pt has no issues with ambulation   Pt has no issues moving head neck or swallowing  No egg or soy allergy known to patient   Patient denies ever being intubated  No FH of Malignant Hyperthermia  Pt is not on diet pills or shots  Pt is not on home 02   Pt is not on blood thinners   Pt denies issues with constipation   Pt is not on dialysis  Pt denise any abnormal heart rhythms   Pt denies any upcoming cardiac testing  Patient's chart reviewed by Rogena Class CNRA prior to pre-visit and patient appropriate for the [facility].  Pre-visit completed and red dot placed by patient's name on their procedure day (on provider's schedule).     Visit by phone  Pt states weight is 252 lb   IInstructions reviewed. Pt given [facility] main # and MD on call # prior to instructions.  Pt states understanding of instructions. Instructed to review again prior to procedure. Pt states they will.

## 2023-06-23 ENCOUNTER — Telehealth: Payer: Self-pay

## 2023-06-23 DIAGNOSIS — M5386 Other specified dorsopathies, lumbar region: Secondary | ICD-10-CM | POA: Diagnosis not present

## 2023-06-23 DIAGNOSIS — M9905 Segmental and somatic dysfunction of pelvic region: Secondary | ICD-10-CM | POA: Diagnosis not present

## 2023-06-23 DIAGNOSIS — M9903 Segmental and somatic dysfunction of lumbar region: Secondary | ICD-10-CM | POA: Diagnosis not present

## 2023-06-23 DIAGNOSIS — M9904 Segmental and somatic dysfunction of sacral region: Secondary | ICD-10-CM | POA: Diagnosis not present

## 2023-06-23 NOTE — Telephone Encounter (Signed)
 Copied from CRM [id number]. Topic: Clinical - Request for Lab/Test Order >> Jun 23, 2023  9:05 AM Palma Bob wrote: Reason for CRM: Patient states her orthopedic referral is requesting copies of her xrays on a CD so they can be reviewed. Patient provided contact info [phone], patient states she's going to the urgent care and they need soon as possible as of today before 5pm.

## 2023-06-23 NOTE — Telephone Encounter (Signed)
 Pt called. LVM advising that patient needs to call imaging for CD. Imaging phone number given

## 2023-06-29 ENCOUNTER — Other Ambulatory Visit: Payer: Self-pay | Admitting: Family

## 2023-06-29 DIAGNOSIS — E559 Vitamin D deficiency, unspecified: Secondary | ICD-10-CM

## 2023-07-01 DIAGNOSIS — M25562 Pain in left knee: Secondary | ICD-10-CM | POA: Diagnosis not present

## 2023-07-01 DIAGNOSIS — M25561 Pain in right knee: Secondary | ICD-10-CM | POA: Diagnosis not present

## 2023-07-07 NOTE — Progress Notes (Unsigned)
 [facility] History and Physical   Primary Care Physician:  Crecencio Dodge, Candida Chalk, DO   Reason for Procedure:  Colorectal cancer screening  Plan:    Screening colonoscopy     HPI: Jody Bryan is a 43 y.o. female undergoing screening colonoscopy for colorectal cancer screening.  This is the patient's first colonoscopy.  No family history of colorectal cancer or polyps.  Patient denies current symptoms of change in bowel habits or rectal bleeding.   Past Medical History:  Diagnosis Date   Arthritis    Bilateral leg edema    Chicken pox    Depression    Drug use    Dyspnea    Low back pain     Past Surgical History:  Procedure Laterality Date   WISDOM TOOTH EXTRACTION  08/2019    Prior to Admission medications   Medication Sig Start Date End Date Taking? Authorizing Provider  Cholecalciferol (VITAMIN D -3) 25 MCG (1000 UT) CAPS Take by mouth.    [provider]  levonorgestrel  (MIRENA ) 20 MCG/DAY IUD by Intrauterine route. 05/18/22   [provider]  Melatonin 10 MG TABS Take by mouth.    [provider]  meloxicam  (MOBIC ) 15 MG tablet Take 1 tablet (15 mg total) by mouth daily. Patient not taking: Reported on 06/17/2023 05/27/23   Adra Alanis, FNP  Sodium Sulfate-Mag Sulfate-KCl (SUTAB ) [phone] MG TABS Take 12 tablets by mouth as directed. 06/17/23   Truddie Furrow, MD  Vitamin D , Ergocalciferol , (DRISDOL ) 1.25 MG (50000 UNIT) CAPS capsule TAKE 1 CAPSULE (50,000 UNITS TOTAL) BY MOUTH EVERY 7 (SEVEN) DAYS 05/31/23   Lowne Chase, Yvonne R, DO    Current Outpatient Medications  Medication Sig Dispense Refill   levonorgestrel  (MIRENA ) 20 MCG/DAY IUD by Intrauterine route.     Melatonin 10 MG TABS Take by mouth.     Vitamin D , Ergocalciferol , (DRISDOL ) 1.25 MG (50000 UNIT) CAPS capsule TAKE 1 CAPSULE (50,000 UNITS TOTAL) BY MOUTH EVERY 7 (SEVEN) DAYS 12 capsule 1   Cholecalciferol (VITAMIN D -3) 25 MCG (1000 UT) CAPS  Take by mouth.     meloxicam  (MOBIC ) 15 MG tablet Take 1 tablet (15 mg total) by mouth daily. (Patient not taking: Reported on 06/17/2023) 30 tablet 0   Current Facility-Administered Medications  Medication Dose Route Frequency Provider Last Rate Last Admin   0.9 %  sodium chloride  infusion  500 mL Intravenous Continuous Shakia Sebastiano, Scarlette Currier, MD        Allergies as of 07/08/2023   (No Known Allergies)    Family History  Problem Relation Age of Onset   Hypertension Mother    Obesity Mother    Depression Mother    Hypertension Father    Depression Father    Sleep apnea Father    Drug abuse Father    Obesity Father    Cancer Father    Alcohol abuse Sister    Stroke Sister    Colon polyps Paternal Aunt    Heart disease Maternal Grandmother    Alcohol abuse Maternal Grandfather    Arthritis Paternal Grandmother    Cancer Paternal Grandmother        breast   Colon cancer Neg Hx    Esophageal cancer Neg Hx    Rectal cancer Neg Hx    Stomach cancer Neg Hx     Social History   Socioeconomic History   Marital status: Single    Spouse name: Not on file   Number of children: 1  Years of education: Not on file   Highest education level: Bachelor's degree (e.g., BA, AB, BS)  Occupational History   Occupation:  call center   Occupation: Stay at home  Tobacco Use   Smoking status: Former    Types: Cigarettes   Smokeless tobacco: Never  Vaping Use   Vaping status: Never Used  Substance and Sexual Activity   Alcohol use: Yes    Alcohol/week: 0.0 standard drinks of alcohol    Comment: social   Drug use: Not Currently   Sexual activity: Yes    Birth control/protection: I.U.D.  Other Topics Concern   Not on file  Social History Narrative   Exercise ---  5 days a week for 40 min   Social Drivers of Health   Financial Resource Strain: Low Risk  (03/28/2023)   Overall Financial Resource Strain (CARDIA)    Difficulty of Paying Living Expenses: Not very hard  Food Insecurity:  No Food Insecurity (03/28/2023)   Hunger Vital Sign    Worried About Running Out of Food in the Last Year: Never true    Ran Out of Food in the Last Year: Never true  Transportation Needs: No Transportation Needs (03/28/2023)   PRAPARE - Administrator, Civil Service (Medical): No    Lack of Transportation (Non-Medical): No  Physical Activity: Unknown (03/28/2023)   Exercise Vital Sign    Days of Exercise per Week: 0 days    Minutes of Exercise per Session: Not on file  Stress: Stress Concern Present (03/28/2023)   [employer] of Occupational Health - Occupational Stress Questionnaire    Feeling of Stress : Very much  Social Connections: Moderately Isolated (03/28/2023)   Social Connection and Isolation Panel [NHANES]    Frequency of Communication with Friends and Family: More than three times a week    Frequency of Social Gatherings with Friends and Family: Never    Attends Religious Services: More than 4 times per year    Active Member of [employer] or Organizations: No    Attends Engineer, structural: Not on file    Marital Status: Never married  Intimate Partner Violence: Not on file    Review of Systems:  All other review of systems negative except as mentioned in the HPI.  Physical Exam: Vital signs BP (!) 149/97   Pulse 81   Temp 97.9 F (36.6 C)   Ht 5\' 6"  (1.676 m)   Wt 252 lb (114.3 kg)   SpO2 100%   BMI 40.67 kg/m   General:   Alert,  Well-developed, well-nourished, pleasant and cooperative in NAD Airway:  Mallampati 3 Lungs:  Clear throughout to auscultation.   Heart:  Regular rate and rhythm; no murmurs, clicks, rubs,  or gallops. Abdomen:  Soft, nontender and nondistended. Normal bowel sounds.   Neuro/Psych:  Normal mood and affect. A and O x 3  Eugenia Hess, MD [facility]

## 2023-07-08 ENCOUNTER — Encounter: Payer: Self-pay | Admitting: Pediatrics

## 2023-07-08 ENCOUNTER — Ambulatory Visit: Admitting: Pediatrics

## 2023-07-08 VITALS — BP 155/90 | HR 70 | Temp 97.9°F | Resp 24 | Ht 66.0 in | Wt 252.0 lb

## 2023-07-08 DIAGNOSIS — K634 Enteroptosis: Secondary | ICD-10-CM

## 2023-07-08 DIAGNOSIS — E669 Obesity, unspecified: Secondary | ICD-10-CM | POA: Diagnosis not present

## 2023-07-08 DIAGNOSIS — K635 Polyp of colon: Secondary | ICD-10-CM

## 2023-07-08 DIAGNOSIS — Z1211 Encounter for screening for malignant neoplasm of colon: Secondary | ICD-10-CM | POA: Diagnosis not present

## 2023-07-08 DIAGNOSIS — D125 Benign neoplasm of sigmoid colon: Secondary | ICD-10-CM | POA: Diagnosis not present

## 2023-07-08 DIAGNOSIS — F32A Depression, unspecified: Secondary | ICD-10-CM | POA: Diagnosis not present

## 2023-07-08 MED ORDER — SODIUM CHLORIDE 0.9 % IV SOLN
500.0000 mL | INTRAVENOUS | Status: DC
Start: 2023-07-08 — End: 2023-07-08

## 2023-07-08 NOTE — Progress Notes (Signed)
 Pt's states no medical or surgical changes since previsit or office visit.

## 2023-07-08 NOTE — Progress Notes (Signed)
 Report to PACU, RN, vss, BBS= Clear.

## 2023-07-08 NOTE — Patient Instructions (Signed)
Resume previous diet and medications. Awaiting pathology results. Repeat Colonoscopy date to be determined based on pathology results.  Handouts provided on Colon polyps.  YOU HAD AN ENDOSCOPIC PROCEDURE TODAY AT [facility]:   Refer to the procedure report that was given to you for any specific questions about what was found during the examination.  If the procedure report does not answer your questions, please call your gastroenterologist to clarify.  If you requested that your care partner not be given the details of your procedure findings, then the procedure report has been included in a sealed envelope for you to review at your convenience later.  YOU SHOULD EXPECT: Some feelings of bloating in the abdomen. Passage of more gas than usual.  Walking can help get rid of the air that was put into your GI tract during the procedure and reduce the bloating. If you had a lower endoscopy (such as a colonoscopy or flexible sigmoidoscopy) you may notice spotting of blood in your stool or on the toilet paper. If you underwent a bowel prep for your procedure, you may not have a normal bowel movement for a few days.  Please Note:  You might notice some irritation and congestion in your nose or some drainage.  This is from the oxygen used during your procedure.  There is no need for concern and it should clear up in a day or so.  SYMPTOMS TO REPORT IMMEDIATELY:  Following lower endoscopy (colonoscopy or flexible sigmoidoscopy):  Excessive amounts of blood in the stool  Significant tenderness or worsening of abdominal pains  Swelling of the abdomen that is new, acute  Fever of 100F or higher  For urgent or emergent issues, a gastroenterologist can be reached at any hour by calling [phone]. Do not use MyChart messaging for urgent concerns.    DIET:  We do recommend a small meal at first, but then you may proceed to your regular diet.  Drink plenty of fluids but you should avoid  alcoholic beverages for 24 hours.  ACTIVITY:  You should plan to take it easy for the rest of today and you should NOT DRIVE or use heavy machinery until tomorrow (because of the sedation medicines used during the test).    FOLLOW UP: Our staff will call the number listed on your records the next business day following your procedure.  We will call around 7:15- 8:00 am to check on you and address any questions or concerns that you may have regarding the information given to you following your procedure. If we do not reach you, we will leave a message.     If any biopsies were taken you will be contacted by phone or by letter within the next 1-3 weeks.  Please call us at [phone] if you have not heard about the biopsies in 3 weeks.    SIGNATURES/CONFIDENTIALITY: You and/or your care partner have signed paperwork which will be entered into your electronic medical record.  These signatures attest to the fact that that the information above on your After Visit Summary has been reviewed and is understood.  Full responsibility of the confidentiality of this discharge information lies with you and/or your care-partner.

## 2023-07-08 NOTE — Progress Notes (Signed)
 Called to room to assist during endoscopic procedure.  Patient ID and intended procedure confirmed with present staff. Received instructions for my participation in the procedure from the performing physician.

## 2023-07-08 NOTE — Op Note (Signed)
 [facility] Patient Name: Jody Bryan Procedure Date: 07/08/2023 8:35 AM MRN: [id number] Endoscopist: Eugenia Hess , MD, [id number] Age: 43 Referring MD:  Date of Birth: [birth year] Gender: Female Account #: [id number] Procedure:                Colonoscopy Indications:              Screening for colorectal malignant neoplasm, This                            is the patient's first colonoscopy Medicines:                Monitored Anesthesia Care Procedure:                Pre-Anesthesia Assessment:                           - Prior to the procedure, a History and Physical                            was performed, and patient medications and                            allergies were reviewed. The patient's tolerance of                            previous anesthesia was also reviewed. The risks                            and benefits of the procedure and the sedation                            options and risks were discussed with the patient.                            All questions were answered, and informed consent                            was obtained. Prior Anticoagulants: The patient has                            taken no anticoagulant or antiplatelet agents. ASA                            Grade Assessment: III - A patient with severe                            systemic disease. After reviewing the risks and                            benefits, the patient was deemed in satisfactory                            condition to undergo the procedure.  After obtaining informed consent, the colonoscope                            was passed under direct vision. Throughout the                            procedure, the patient's blood pressure, pulse, and                            oxygen saturations were monitored continuously. The                            CF [id number] #[id number] was introduced through the anus                            and advanced to  the cecum, identified by                            appendiceal orifice and ileocecal valve. The                            colonoscopy was performed without difficulty. The                            patient tolerated the procedure well. The quality                            of the bowel preparation was good. The ileocecal                            valve, appendiceal orifice, and rectum were                            photographed. Scope In: 8:38:23 AM Scope Out: 8:57:50 AM Total Procedure Duration: 0 hours 19 minutes 27 seconds  Findings:                 The perianal and digital rectal examinations were                            normal. Pertinent negatives include normal                            sphincter tone and no palpable rectal lesions.                           A 6 mm polyp was found in the sigmoid colon. The                            polyp was sessile. The polyp was removed with a                            cold snare. Resection and retrieval were complete.  The retroflexed view of the distal rectum and anal                            verge was normal and showed no anal or rectal                            abnormalities. Complications:            No immediate complications. Estimated blood loss:                            Minimal. Estimated Blood Loss:     Estimated blood loss was minimal. Impression:               - One 6 mm polyp in the sigmoid colon, removed with                            a cold snare. Resected and retrieved.                           - The distal rectum and anal verge are normal on                            retroflexion view. Recommendation:           - Discharge patient to home (ambulatory).                           - Await pathology results.                           - Repeat colonoscopy for surveillance based on                            pathology results.                           - The findings and recommendations  were discussed                            with the patient's family.                           - Patient has a contact number available for                            emergencies. The signs and symptoms of potential                            delayed complications were discussed with the                            patient. Return to normal activities tomorrow.                            Written discharge instructions were provided to the  patient. Eugenia Hess, MD 07/08/2023 9:01:32 AM This report has been signed electronically.

## 2023-07-09 ENCOUNTER — Telehealth: Payer: Self-pay

## 2023-07-09 NOTE — Telephone Encounter (Signed)
  Follow up Call-     07/08/2023    8:17 AM  Call back number  Post procedure Call Back phone  # [phone]  Permission to leave phone message Yes     Patient questions:  Do you have a fever, pain , or abdominal swelling? No. Pain Score  0 *  Have you tolerated food without any problems? Yes.    Have you been able to return to your normal activities? Yes.    Do you have any questions about your discharge instructions: Diet   No. Medications  No. Follow up visit  No.  Do you have questions or concerns about your Care? No.  Actions: * If pain score is 4 or above: No action needed, pain <4.

## 2023-07-12 ENCOUNTER — Encounter: Payer: Self-pay | Admitting: Pediatrics

## 2023-07-12 LAB — SURGICAL PATHOLOGY

## 2023-07-14 ENCOUNTER — Ambulatory Visit (INDEPENDENT_AMBULATORY_CARE_PROVIDER_SITE_OTHER): Admitting: Family Medicine

## 2023-07-14 ENCOUNTER — Encounter (INDEPENDENT_AMBULATORY_CARE_PROVIDER_SITE_OTHER): Payer: Self-pay | Admitting: Family Medicine

## 2023-07-14 VITALS — BP 141/87 | HR 87 | Temp 99.2°F | Ht 66.0 in | Wt 253.0 lb

## 2023-07-14 DIAGNOSIS — E559 Vitamin D deficiency, unspecified: Secondary | ICD-10-CM

## 2023-07-14 DIAGNOSIS — F3289 Other specified depressive episodes: Secondary | ICD-10-CM

## 2023-07-14 DIAGNOSIS — E669 Obesity, unspecified: Secondary | ICD-10-CM

## 2023-07-14 DIAGNOSIS — F5089 Other specified eating disorder: Secondary | ICD-10-CM

## 2023-07-14 DIAGNOSIS — E66813 Obesity, class 3: Secondary | ICD-10-CM

## 2023-07-14 DIAGNOSIS — M25562 Pain in left knee: Secondary | ICD-10-CM

## 2023-07-14 DIAGNOSIS — M25561 Pain in right knee: Secondary | ICD-10-CM

## 2023-07-14 DIAGNOSIS — Z6841 Body Mass Index (BMI) 40.0 and over, adult: Secondary | ICD-10-CM | POA: Diagnosis not present

## 2023-07-14 MED ORDER — BUPROPION HCL ER (SR) 150 MG PO TB12
150.0000 mg | ORAL_TABLET | Freq: Every morning | ORAL | 0 refills | Status: DC
Start: 2023-07-14 — End: 2023-08-23

## 2023-07-14 NOTE — Progress Notes (Signed)
 Office: [phone]  /  Fax: [phone]  WEIGHT SUMMARY AND BIOMETRICS  Anthropometric Measurements Height: 5\' 6"  (1.676 m) Weight: 253 lb (114.8 kg) BMI (Calculated): 40.85 Weight at Last Visit: 253 lb Weight Lost Since Last Visit: 0 Weight Gained Since Last Visit: 0 Starting Weight: 252 lb Total Weight Loss (lbs): 0 lb (0 kg) Peak Weight: 270 lb   Body Composition  Body Fat %: 46.1 % Fat Mass (lbs): 117 lbs Muscle Mass (lbs): 130 lbs Total Body Water (lbs): 93.8 lbs Visceral Fat Rating : 13   Other Clinical Data Fasting: no Labs: no Today's Visit #: 4 Starting Date: 10/28/22    Chief Complaint: OBESITY   Discussed the use of AI scribe software for clinical note transcription with the patient, who gave verbal consent to proceed.  History of Present Illness Jody Bryan "Jody Bryan" is a 43 year old female who presents for obesity treatment plan assessment and progress evaluation.  She has been following a category two eating plan but has only adhered to it about thirty percent of the time due to lack of family support for her healthy eating behaviors. Despite these challenges, she has maintained her weight over the past month.  She experiences ongoing knee pain, which has hindered her ability to exercise. She is currently on a ten-day course of prednisone , with two days remaining, at a dose of 10 mg daily. She has consulted with an orthopedic specialist who noted fluid in her knees and provided exercises to strengthen her knees, back, and core. She has been attempting to follow these exercises but suspects overexertion at the gym.  She recently underwent a colonoscopy where one polyp was found, and she was advised to return in ten years. The medication used during the procedure was effective, and she experienced good sleep without a headache.  Her current medications include over-the-counter vitamin D  and prescription vitamin D . She is not on any other medications  for weight loss at this time.  She is trying to cook more and is looking at recipes, including those from [employer]. No history of kidney stones or seizures.      PHYSICAL EXAM:  Blood pressure (!) 141/87, pulse 87, temperature 99.2 F (37.3 C), height 5\' 6"  (1.676 m), weight 253 lb (114.8 kg), SpO2 98%. Body mass index is 40.84 kg/m.  DIAGNOSTIC DATA REVIEWED:  BMET    Component Value Date/Time   NA 141 05/27/2023 1443   NA 142 10/28/2022 0925   K 4.4 05/27/2023 1443   CL 106 05/27/2023 1443   CO2 27 05/27/2023 1443   GLUCOSE 77 05/27/2023 1443   BUN 14 05/27/2023 1443   BUN 13 10/28/2022 0925   CREATININE 0.73 05/27/2023 1443   CREATININE 0.82 06/08/2014 1627   CALCIUM 9.3 05/27/2023 1443   GFRNONAA >60 08/27/2020 0745   GFRAA 133 02/22/2020 1039   Lab Results  Component Value Date   HGBA1C 5.9 05/27/2023   HGBA1C 5.7 06/11/2015   Lab Results  Component Value Date   INSULIN  11.6 10/28/2022   INSULIN  25.7 (H) 03/17/2017   Lab Results  Component Value Date   TSH 1.45 05/27/2023   CBC    Component Value Date/Time   WBC 11.1 (H) 05/27/2023 1443   RBC 4.65 05/27/2023 1443   HGB 14.2 05/27/2023 1443   HGB 12.4 06/19/2020 0829   HCT 42.8 05/27/2023 1443   HCT 36.2 06/19/2020 0829   PLT 364.0 05/27/2023 1443   PLT 355 06/19/2020 0829  MCV 92.2 05/27/2023 1443   MCV 94 06/19/2020 0829   MCH 33.0 08/27/2020 0745   MCHC 33.2 05/27/2023 1443   RDW 13.4 05/27/2023 1443   RDW 12.9 06/19/2020 0829   Iron Studies No results found for: "IRON", "TIBC", "FERRITIN", "IRONPCTSAT" Lipid Panel     Component Value Date/Time   CHOL 173 05/18/2022 1543   CHOL 156 03/17/2017 1042   TRIG 127.0 05/18/2022 1543   HDL 61.70 05/18/2022 1543   HDL 47 03/17/2017 1042   CHOLHDL 3 05/18/2022 1543   VLDL 25.4 05/18/2022 1543   LDLCALC 86 05/18/2022 1543   LDLCALC 94 03/17/2017 1042   Hepatic Function Panel     Component Value Date/Time   PROT 6.8 05/27/2023 1443    PROT 7.0 10/28/2022 0925   ALBUMIN 4.1 05/27/2023 1443   ALBUMIN 4.4 10/28/2022 0925   AST 12 05/27/2023 1443   ALT 12 05/27/2023 1443   ALKPHOS 76 05/27/2023 1443   BILITOT 0.6 05/27/2023 1443   BILITOT 0.8 10/28/2022 0925   BILIDIR 0.2 06/11/2015 1024   IBILI 0.6 06/08/2014 1627      Component Value Date/Time   TSH 1.45 05/27/2023 1443   Nutritional Lab Results  Component Value Date   VD25OH 23.57 (L) 05/27/2023   VD25OH 17.44 (L) 05/18/2022   VD25OH 24.61 (L) 08/12/2021     Assessment and Plan Assessment & Plan Obesity Obesity management is ongoing. She has maintained weight over the last month despite challenges with adherence to the prescribed eating plan and lack of exercise due to knee pain. Prednisone  use for knee pain may contribute to weight gain. Discussed the impact of prednisone  on weight and the potential for knee injections as a localized treatment to avoid systemic effects. She is exploring new breakfast options to improve adherence to the eating plan. - Continue category two eating plan with new breakfast options. - Start Wellbutrin SR 150 mg in the morning to address emotional eating and cravings. - Encourage gradual increase in physical activity as knee pain improves.  Emotional eating difficulties She experiences emotional eating difficulties, particularly with cravings. Discussed the role of neurotransmitters in cravings and the potential benefit of bupropion to help manage these cravings. She prefers to address cravings over physical hunger. Explained that bupropion helps recycle neurotransmitters responsible for reward, reducing cravings. It takes about two weeks to take effect, with dry mouth as a common side effect. Low risk of increased blood pressure, which will be monitored. - Monitor for side effects such as dry mouth and potential increase in blood pressure.  Knee pain Knee pain is likely due to fluid accumulation and inflammation. She has been on  prednisone  and is nearing completion of the course. Orthopedic consultation has been done, and exercises have been recommended to strengthen the knees and core. Discussed potential for knee injections if needed. Emphasized preference for localized treatment to minimize systemic effects. - Complete current course of prednisone . - Continue prescribed knee exercises to improve strength and reduce pain. - Consider knee injections if pain persists after completing prednisone .  Vitamin D  deficiency Vitamin D  deficiency is being managed with over-the-counter vitamin D  supplementation. She reports being well-managed on current regimen. - Continue current vitamin D  supplementation regimen.  Follow-up Follow-up is necessary to assess progress with weight management and knee pain management. - Schedule follow-up appointment in 2-3 weeks. - Consider scheduling multiple appointments in advance to ensure availability.   She was informed of the importance of frequent follow up visits to maximize  her success with intensive lifestyle modifications for her multiple health conditions.    Jasmine Mesi, MD

## 2023-07-15 DIAGNOSIS — Z5986 Financial insecurity: Secondary | ICD-10-CM | POA: Diagnosis not present

## 2023-07-15 DIAGNOSIS — F413 Other mixed anxiety disorders: Secondary | ICD-10-CM | POA: Diagnosis not present

## 2023-07-20 DIAGNOSIS — Z62820 Parent-biological child conflict: Secondary | ICD-10-CM | POA: Diagnosis not present

## 2023-07-20 DIAGNOSIS — Z5689 Other problems related to employment: Secondary | ICD-10-CM | POA: Diagnosis not present

## 2023-07-20 DIAGNOSIS — F413 Other mixed anxiety disorders: Secondary | ICD-10-CM | POA: Diagnosis not present

## 2023-07-29 DIAGNOSIS — Z630 Problems in relationship with spouse or partner: Secondary | ICD-10-CM | POA: Diagnosis not present

## 2023-07-29 DIAGNOSIS — F4321 Adjustment disorder with depressed mood: Secondary | ICD-10-CM | POA: Diagnosis not present

## 2023-08-01 ENCOUNTER — Emergency Department (HOSPITAL_BASED_OUTPATIENT_CLINIC_OR_DEPARTMENT_OTHER)

## 2023-08-01 ENCOUNTER — Encounter (HOSPITAL_BASED_OUTPATIENT_CLINIC_OR_DEPARTMENT_OTHER): Payer: Self-pay | Admitting: Emergency Medicine

## 2023-08-01 ENCOUNTER — Other Ambulatory Visit: Payer: Self-pay

## 2023-08-01 ENCOUNTER — Emergency Department (HOSPITAL_BASED_OUTPATIENT_CLINIC_OR_DEPARTMENT_OTHER)
Admission: EM | Admit: 2023-08-01 | Discharge: 2023-08-01 | Disposition: A | Discharge status: Home / Self Care | Attending: Emergency Medicine | Admitting: Emergency Medicine

## 2023-08-01 DIAGNOSIS — S41111A Laceration without foreign body of right upper arm, initial encounter: Secondary | ICD-10-CM | POA: Insufficient documentation

## 2023-08-01 DIAGNOSIS — S51811A Laceration without foreign body of right forearm, initial encounter: Secondary | ICD-10-CM | POA: Diagnosis not present

## 2023-08-01 DIAGNOSIS — Y990 Civilian activity done for income or pay: Secondary | ICD-10-CM | POA: Diagnosis not present

## 2023-08-01 DIAGNOSIS — S59911A Unspecified injury of right forearm, initial encounter: Secondary | ICD-10-CM | POA: Diagnosis present

## 2023-08-01 DIAGNOSIS — W25XXXA Contact with sharp glass, initial encounter: Secondary | ICD-10-CM | POA: Insufficient documentation

## 2023-08-01 NOTE — ED Provider Notes (Signed)
 [facility] Provider Note   CSN: [id number] Arrival date & time: 08/01/23  0100     History Chief Complaint  Patient presents with   Laceration    HPI Jody Bryan is a 43 y.o. female presenting for right forearm laceration. Cleaning glass at work that fell Shattered and piece stabbed her in right forearm.   Patient's recorded medical, surgical, social, medication list and allergies were reviewed in the Snapshot window as part of the initial history.   Review of Systems   Review of Systems  Constitutional:  Negative for chills and fever.  HENT:  Negative for ear pain and sore throat.   Eyes:  Negative for pain and visual disturbance.  Respiratory:  Negative for cough and shortness of breath.   Cardiovascular:  Negative for chest pain and palpitations.  Gastrointestinal:  Negative for abdominal pain and vomiting.  Genitourinary:  Negative for dysuria and hematuria.  Musculoskeletal:  Negative for arthralgias and back pain.  Skin:  Negative for color change and rash.  Neurological:  Negative for seizures and syncope.  All other systems reviewed and are negative.   Physical Exam Updated Vital Signs BP (!) 150/102 (BP Location: Left Arm)   Pulse 85   Temp 98.3 F (36.8 C) (Oral)   Resp 16   Ht 5\' 6"  (1.676 m)   Wt 114.8 kg   SpO2 100%   BMI 40.84 kg/m  Physical Exam Constitutional:      General: She is not in acute distress.    Appearance: She is not ill-appearing or toxic-appearing.  HENT:     Head: Normocephalic and atraumatic.  Eyes:     Extraocular Movements: Extraocular movements intact.     Pupils: Pupils are equal, round, and reactive to light.  Cardiovascular:     Rate and Rhythm: Normal rate.  Pulmonary:     Effort: No respiratory distress.  Abdominal:     General: Abdomen is flat.  Musculoskeletal:        General: Signs of injury present. No swelling or deformity.     Cervical back: Normal range  of motion. No rigidity.  Skin:    General: Skin is warm and dry.  Neurological:     General: No focal deficit present.     Mental Status: She is alert and oriented to person, place, and time.  Psychiatric:        Mood and Affect: Mood normal.      ED Course/ Medical Decision Making/ A&P    Procedures .Laceration Repair  Date/Time: 08/01/2023 1:42 AM  Performed by: Onetha Bile, MD Authorized by: Onetha Bile, MD   Consent:    Consent obtained:  Verbal   Consent given by:  Parent   Risks, benefits, and alternatives were discussed: yes   Laceration details:    Location:  Shoulder/arm   Shoulder/arm location:  R lower arm   Length (cm):  1 Treatment:    Amount of cleaning:  Standard Skin repair:    Repair method:  Tissue adhesive Approximation:    Approximation:  Close Repair type:    Repair type:  Simple Post-procedure details:    Procedure completion:  Tolerated    Medications Ordered in ED Medications - No data to display Medical Decision Making:   Jody Bryan is a 43 y.o. female who presented to the ED today with a 1 cm laceration to their right forearm. They are neurovascularly intact.  Reviewed and confirmed nursing documentation for  past medical history, family history, social history.     Assessment:   Laceration that will require repair.?  Given mechanism of injury, underlying fracture, deformity, wound site infection and systemic injury were all considered inconsistent with this current history of present illness and physical exam.  Plan:  Lac repair as noted in procedures Tetanus UTD  Wound care with standard precautions  and antibiotic ointment   Radiology- Images reviewed personally and agree with radiology report  DG Forearm Right Result Date: 08/01/2023 EXAM: 2 VIEW(S) XRAY OF THE RIGHT FOREARM 08/01/2023 02:02:00 AM COMPARISON: None available. CLINICAL HISTORY: Foreign body eval. 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  Lac to right forearm. ?fb. FINDINGS: BONES AND JOINTS: No acute fracture. No focal osseous lesion. No joint dislocation. SOFT TISSUES: No radiopaque foreign body is seen. The soft tissues are otherwise unremarkable. IMPRESSION: 1. No radiopaque foreign body is seen. Electronically signed by: Zadie Herter MD 08/01/2023 02:06 AM EDT RP Workstation: [id number]     Disposition:  I have considered need for hospitalization, however, considering all of the above, I believe this patient is stable for discharge at this time.  Patient/family educated about specific return precautions for given chief complaint and symptoms.  Patient/family educated about follow-up with PCP.     Patient/family expressed understanding of return precautions and need for follow-up. Patient spoken to regarding all imaging and laboratory results and appropriate follow up for these results. All education provided in verbal form with additional information in written form. Time was allowed for answering of patient questions. Patient discharged.    Emergency Department Medication Summary:   Medications - No data to display    Clinical Impression:  1. Laceration of right upper extremity, initial encounter      Discharge   Final Clinical Impression(s) / ED Diagnoses Final diagnoses:  Laceration of right upper extremity, initial encounter    Rx / DC Orders ED Discharge Orders     None         Onetha Bile, MD 08/01/23 0231

## 2023-08-01 NOTE — ED Triage Notes (Signed)
 Pt c/o laceration to right forearm from a glass breaking while trying to get it down from a rack, bleeding controlled

## 2023-08-03 DIAGNOSIS — M170 Bilateral primary osteoarthritis of knee: Secondary | ICD-10-CM | POA: Diagnosis not present

## 2023-08-03 DIAGNOSIS — M1712 Unilateral primary osteoarthritis, left knee: Secondary | ICD-10-CM | POA: Diagnosis not present

## 2023-08-03 DIAGNOSIS — M1711 Unilateral primary osteoarthritis, right knee: Secondary | ICD-10-CM | POA: Diagnosis not present

## 2023-08-16 DIAGNOSIS — M9903 Segmental and somatic dysfunction of lumbar region: Secondary | ICD-10-CM | POA: Diagnosis not present

## 2023-08-16 DIAGNOSIS — M9905 Segmental and somatic dysfunction of pelvic region: Secondary | ICD-10-CM | POA: Diagnosis not present

## 2023-08-16 DIAGNOSIS — M9904 Segmental and somatic dysfunction of sacral region: Secondary | ICD-10-CM | POA: Diagnosis not present

## 2023-08-16 DIAGNOSIS — M5386 Other specified dorsopathies, lumbar region: Secondary | ICD-10-CM | POA: Diagnosis not present

## 2023-08-18 NOTE — Therapy (Signed)
 OUTPATIENT PHYSICAL THERAPY LOWER EXTREMITY EVALUATION   Patient Name: Jody Bryan MRN: [id number] DOB:September 28, 1980, 43 y.o., female Today's Date: 08/23/2023   END OF SESSION:  PT End of Session - 08/23/23 0802     Visit Number 1    Date for PT Re-Evaluation 10/18/23    Authorization Type [employer]    Authorization Time Period Authorization pending    Authorization - Visit Number 1    PT Start Time 0802    PT Stop Time 0850    PT Time Calculation (min) 48 min    Activity Tolerance Patient tolerated treatment well    Behavior During Therapy WFL for tasks assessed/performed          Past Medical History:  Diagnosis Date   Arthritis    Bilateral leg edema    Chicken pox    Depression    Drug use    Dyspnea    Low back pain    Past Surgical History:  Procedure Laterality Date   WISDOM TOOTH EXTRACTION  08/2019   Patient Active Problem List   Diagnosis Date Noted   Elevated blood pressure reading without diagnosis of hypertension 08/23/2023   Depression 08/23/2023   Snoring 02/01/2023   Hx gestational diabetes 10/28/2022   Suspected sleep apnea 10/28/2022   Abnormal metabolism 10/28/2022   Leukocytosis 06/29/2022   Urinary retention 05/28/2022   Allergic conjunctivitis of both eyes 05/28/2022   Urinary frequency 05/28/2022   Contraception, device intrauterine 05/18/2022   Vitamin B12 deficiency 05/18/2022   Inguinal pain 08/12/2021   Class 3 severe obesity with serious comorbidity and body mass index (BMI) of 40.0 to 44.9 in adult 07/11/2021   De Quervain's tenosynovitis, bilateral 01/17/2021   Renal cyst 12/22/2019   Bulging lumbar disc 12/22/2019   Preventative health care 08/09/2017   Vitamin D  deficiency 04/01/2017   Prediabetes 04/01/2017   Other fatigue 03/17/2017   Low back pain with radiation 07/23/2014   Right wrist pain 09/05/2012   Meralgia paraesthetica 05/25/2012    PCP: Antonio Cyndee Jamee JONELLE, DO   REFERRING PROVIDER: Liam Lerner, MD   REFERRING DIAG: M22.2X1,M22.2X2 (ICD-10-CM) - Bilateral patellofemoral stress syndrome   THERAPY DIAG:  Chronic pain of both knees  Muscle weakness (generalized)  Other abnormalities of gait and mobility  RATIONALE FOR EVALUATION AND TREATMENT: Rehabilitation  ONSET DATE: ~3 months  NEXT MD VISIT: To be scheduled pending outcome of PT   SUBJECTIVE:  SUBJECTIVE STATEMENT: Pt reports she just woke up and her knees were hurting potentially from altering her mobility due to her back pain. Pain is constant, even when sleeping. Initially L knee was more painful, but now R knee is more painful and swollen.  Sometimes her knees feel like they wan to collapse when she is walking.  PAIN: Are you having pain? Yes: NPRS scale: 7/10  Pain location: B anteromedial knees Pain description: constant dull ache; occasional sharp, shooting pain  Aggravating factors: prolonged sitting or standing, squatting  Relieving factors: walking   PERTINENT HISTORY:  Arthritis, LBP, GERD, PreDiabetes, obesity, depression, fatigue, dyspnea  PRECAUTIONS: None  RED FLAGS: None  WEIGHT BEARING RESTRICTIONS: No  FALLS:  Has patient fallen in last 6 months? No  LIVING ENVIRONMENT:  Lives with: lives with their family Lives in: House/apartment Stairs: Yes: External: 2-3 steps; on left going up Has following equipment at home: None  OCCUPATION: [employer], will be switching to working for [employer] from home as of July  PLOF: Independent and Leisure: Has young children, walking 20-30 min/day   PATIENT GOALS: To be able to squat again and walk w/o pain. Also be able to sit w/o pain.   OBJECTIVE: (objective measures completed at initial evaluation unless otherwise dated)  DIAGNOSTIC  FINDINGS:  05/27/23 - DG knee 1-2 views right and left IMPRESSION: Minimal medial and patellofemoral compartment osteoarthritis.    PATIENT SURVEYS:  LEFS: 27 / 80 = 33.8 %, Moderate functional limitation  COGNITION: Overall cognitive status: Within functional limits for tasks assessed    SENSATION: WFL Tingling in back of the knees at nighttime, R>L  EDEMA:  Circumferential:    Right eval Left eval  At joint line 50.0 cm 46.5 cm  6 cm above 53.0 cm 51.5 cm  6 cm below 42.0 cm 41.0 cm   POSTURE:  anterior pelvic tilt and L>R genu recurvatum  PALPATION: Mildly limited patellar mobility R>L  MUSCLE LENGTH: Hamstrings: mod tight R, mild tight L ITB: mild tight B Piriformis: mod tight B Hip flexors: mild tight B Quads: mod tight B Heelcord: mod tight R, mild tight L  LOWER EXTREMITY ROM:  Active ROM Right eval Left eval  Knee flexion 120 125  Knee extension 14 4   Passive ROM Right eval Left eval  Knee flexion 122 134  Knee extension 0 -8  (Blank rows = not tested)  LOWER EXTREMITY MMT:  MMT Right eval Left eval  Hip flexion 4+ 4+  Hip extension 4 4  Hip abduction 4+ 4  Hip adduction 4- 4  Hip internal rotation 4+ 4+  Hip external rotation 3+ p! 4-  Knee flexion 4 4+  Knee extension 4- p! 4+  Ankle dorsiflexion 5 5  Ankle plantarflexion    Ankle inversion    Ankle eversion     (Blank rows = not tested)  LOWER EXTREMITY SPECIAL TESTS:  Knee special tests: Lateral pull sign: positive B Patellofemoral grind test: positive on R   TODAY'S TREATMENT:   08/23/2023 - Eval  SELF CARE:  Reviewed eval findings and role of PT in addressing identified deficits as well as instruction in initial HEP (see below).    PATIENT EDUCATION:  Education details: PT eval findings, anticipated POC, initial HEP, and guidance to avoid genu recurvatum in standing  Person educated: Patient Education method: Explanation, Demonstration, Verbal cues, Tactile cues, and  Handouts Education comprehension: verbalized understanding, returned demonstration, verbal cues required, tactile cues required, and needs further  education  HOME EXERCISE PROGRAM: Access Code: [email or web address] URL: [email or web address] Date: 08/23/2023 Prepared by: Elijah Hidden  Exercises - Supine Hamstring Stretch with Strap  - 2-3 x daily - 7 x weekly - 3 reps - 30 sec hold - Supine Iliotibial Band Stretch with Strap  - 2-3 x daily - 7 x weekly - 3 reps - 30 sec hold - Hip Flexor Stretch with Strap on Table  - 2-3 x daily - 7 x weekly - 3 reps - 30 sec hold - Prone Hip Flexor & Quad Stretch with Strap  - 2-3 x daily - 7 x weekly - 3 reps - 30 sec hold - Supine Quad Set on Towel Roll  - 2 x daily - 7 x weekly - 2 sets - 10 reps - 5 sec hold   ASSESSMENT:  CLINICAL IMPRESSION: Jody Bryan is a 43 y.o. female who was referred to physical therapy for evaluation and treatment for B patellofemoral stress syndrome.  Patient recently completed 6 visits of PT at this clinic for low back pain from 04/08/2023 to 05/12/2023, however did not formally complete the episode as she no showed for her final scheduled visit.  She reports onset of B knee pain ~3 months ago which she attributes potentially to altering her mobility related to her low back pain.  Pain is worse with prolonged sitting or standing.  Patient has deficits in B knee ROM, B proximal LE flexibility, B LE strength, and abnormal posture which are interfering with ADLs and are impacting quality of life.  On LEFS patient scored 27/80 demonstrating moderate functional limitation.  Jody Bryan will benefit from skilled PT to address above deficits to improve mobility and activity tolerance with decreased pain interference.  OBJECTIVE IMPAIRMENTS: Abnormal gait, decreased activity tolerance, decreased balance, decreased coordination, decreased endurance, decreased knowledge of condition, decreased mobility, difficulty walking,  decreased ROM, decreased strength, increased fascial restrictions, impaired perceived functional ability, increased muscle spasms, impaired flexibility, improper body mechanics, postural dysfunction, and pain.   ACTIVITY LIMITATIONS: lifting, sitting, standing, squatting, sleeping, stairs, transfers, bed mobility, and locomotion level  PARTICIPATION LIMITATIONS: meal prep, cleaning, laundry, driving, shopping, community activity, and occupation  PERSONAL FACTORS: Fitness, Past/current experiences, Time since onset of injury/illness/exacerbation, and 3+ comorbidities: Arthritis, LBP, GERD, PreDiabetes, obesity, depression, fatigue, dyspnea are also affecting patient's functional outcome.   REHAB POTENTIAL: Good  CLINICAL DECISION MAKING: Evolving/moderate complexity  EVALUATION COMPLEXITY: Moderate   GOALS: Goals reviewed with patient? Yes  SHORT TERM GOALS: Target date: 09/20/2023  Patient will be independent with initial HEP. Baseline: Initial HEP provided on eval Goal status: INITIAL  2.  Patient will report at least 25% improvement in B knee pain to improve QOL. Baseline: 7/10 Goal status: INITIAL  3.  Patient will demonstrate neutral B knee alignment in stance without genu recurvatum. Baseline:  Goal status: INITIAL   LONG TERM GOALS: Target date: 10/18/2023  Patient will be independent with advanced/ongoing HEP to improve outcomes and carryover.  Baseline:  Goal status: INITIAL  2.  Patient will report at least 50-75% improvement in B knee pain to improve QOL. Baseline: 7/10 Goal status: INITIAL  3.  Patient will demonstrate improved B knee AROM to >/= 0-125 deg to allow for normal gait and stair mechanics. Baseline: Refer to above LE ROM table Goal status: INITIAL  4.  Patient will demonstrate improved B LE strength to >/= 4+/5 for improved stability and ease of mobility. Baseline: Refer to above LE MMT table Goal status: INITIAL  5.  Patient will be able to  ambulate 600' with normal gait pattern without increased pain to access community.  Baseline:  Goal status: INITIAL  6. Patient will be able to ascend/descend stairs with 1 HR and reciprocal step pattern safely to access home and community.  Baseline:  Goal status: INITIAL  7.  Patient will report >/= 36/80 on LEFS (MCID = 9 pts) to demonstrate improved functional ability. Baseline: 27 / 80 = 33.8 % Goal status: INITIAL   PLAN:  PT FREQUENCY: 2x/week  PT DURATION: 8 weeks  PLANNED INTERVENTIONS: 97164- PT Re-evaluation, 97750- Physical Performance Testing, 97110-Therapeutic exercises, 97530- Therapeutic activity, 97112- Neuromuscular re-education, 97535- Self Care, [id number]- Manual therapy, [phone]- Gait training, [phone]- Aquatic Therapy, [phone] (1-2 muscles), [id number] (3+ muscles)- Dry Needling, Patient/Family education, Balance training, Stair training, Taping, Joint mobilization, DME instructions, Cryotherapy, and Moist heat  PLAN FOR NEXT SESSION: Review initial HEP; progress proximal LE flexibility especially in quads; progress hip and knee strengthening with emphasis on quads especially VMO   Elijah CHRISTELLA Hidden, PT 08/23/2023, 6:09 PM

## 2023-08-23 ENCOUNTER — Other Ambulatory Visit: Payer: Self-pay

## 2023-08-23 ENCOUNTER — Ambulatory Visit (INDEPENDENT_AMBULATORY_CARE_PROVIDER_SITE_OTHER): Admitting: Internal Medicine

## 2023-08-23 ENCOUNTER — Encounter (INDEPENDENT_AMBULATORY_CARE_PROVIDER_SITE_OTHER): Payer: Self-pay | Admitting: Internal Medicine

## 2023-08-23 ENCOUNTER — Ambulatory Visit: Attending: Orthopedic Surgery | Admitting: Physical Therapy

## 2023-08-23 ENCOUNTER — Encounter: Payer: Self-pay | Admitting: Physical Therapy

## 2023-08-23 VITALS — BP 124/86 | HR 82 | Temp 98.7°F | Ht 66.0 in | Wt 244.0 lb

## 2023-08-23 DIAGNOSIS — R2689 Other abnormalities of gait and mobility: Secondary | ICD-10-CM | POA: Diagnosis not present

## 2023-08-23 DIAGNOSIS — E66813 Obesity, class 3: Secondary | ICD-10-CM

## 2023-08-23 DIAGNOSIS — G8929 Other chronic pain: Secondary | ICD-10-CM | POA: Diagnosis not present

## 2023-08-23 DIAGNOSIS — R7303 Prediabetes: Secondary | ICD-10-CM | POA: Diagnosis not present

## 2023-08-23 DIAGNOSIS — M25562 Pain in left knee: Secondary | ICD-10-CM | POA: Insufficient documentation

## 2023-08-23 DIAGNOSIS — R29818 Other symptoms and signs involving the nervous system: Secondary | ICD-10-CM

## 2023-08-23 DIAGNOSIS — F3289 Other specified depressive episodes: Secondary | ICD-10-CM

## 2023-08-23 DIAGNOSIS — Z6841 Body Mass Index (BMI) 40.0 and over, adult: Secondary | ICD-10-CM

## 2023-08-23 DIAGNOSIS — M6281 Muscle weakness (generalized): Secondary | ICD-10-CM | POA: Insufficient documentation

## 2023-08-23 DIAGNOSIS — F32A Depression, unspecified: Secondary | ICD-10-CM | POA: Insufficient documentation

## 2023-08-23 DIAGNOSIS — R948 Abnormal results of function studies of other organs and systems: Secondary | ICD-10-CM | POA: Diagnosis not present

## 2023-08-23 DIAGNOSIS — F5089 Other specified eating disorder: Secondary | ICD-10-CM

## 2023-08-23 DIAGNOSIS — M25561 Pain in right knee: Secondary | ICD-10-CM | POA: Diagnosis not present

## 2023-08-23 DIAGNOSIS — R030 Elevated blood-pressure reading, without diagnosis of hypertension: Secondary | ICD-10-CM | POA: Insufficient documentation

## 2023-08-23 MED ORDER — BUPROPION HCL ER (SR) 150 MG PO TB12: 150.0000 mg | ORAL_TABLET | Freq: Every morning | ORAL | 0 refills | Status: DC

## 2023-08-23 NOTE — Assessment & Plan Note (Addendum)
 Blood pressure is slightly elevated at 134/88 mmHg on repeat measurement, with the diastolic pressure being high. Wellbutrin  can raise blood pressure.  This was started by a colleague.  Emphasized the importance of monitoring blood pressure due to risks such as stroke, kidney disease, and heart failure. Optimal blood pressure is 120/80 mmHg. Weight loss and reduced sodium intake can help lower blood pressure. Discussed the potential need for low-dose antihypertensive medication if lifestyle changes do not suffice. - Monitor blood pressure three times a week, morning and before bedtime - Reduce sodium intake - Continue weight loss efforts - Consider low-dose antihypertensive medication if blood pressure remains elevated

## 2023-08-23 NOTE — Assessment & Plan Note (Signed)
 Obesity is characterized by excess body fat. She has a slow metabolism and requires physical activity to aid weight loss. Her body fat percentage has decreased from 45% to 44%, and visceral fat has reduced from 13 to 12. The goal is to reduce body fat percentage to 34%. She has lost 9 pounds since the last visit, attributed to meal replacements and dietary changes. Obesity is a treatable disease, not curable, and requires a comprehensive approach including diet, exercise, and possibly pharmacotherapy. Discussed the potential use of GLP-1 agonist (Wegovy ) for weight management, which may also aid in managing prediabetes and blood pressure.  Considering degree of obesity and comorbidities she would benefit from aggressive treatment with GLP-1. - Continue 1200 calorie nutrition plan - Continue exercise regimen 4 days a week, 20-30 minutes of cardio - Consider GLP-1 agonist (Wegovy ) for weight management - Discuss pharmacotherapy options in next visit - Schedule 40-minute follow-up appointment in 4 weeks

## 2023-08-23 NOTE — Assessment & Plan Note (Signed)
Patient has a slower than predicted metabolism. IC 1512 vs. calculated 1948. This may contribute to weight gain, chronic fatigue and difficulty losing weight.  We reviewed measures to improve metabolism including not skipping meals, progressive strengthening exercises, increasing protein intake at every meal and maintaining adequate hydration and sleep.

## 2023-08-23 NOTE — Assessment & Plan Note (Signed)
 I have reviewed sleep study does not show significant sleep disordered breathing.

## 2023-08-23 NOTE — Assessment & Plan Note (Addendum)
 Most recent A1c was 5.9.  I have not seen patient since November she has not been started on metformin and has an upward trending A1c.  She is at risk for progression.  We discussed today the role of GLP-1 for weight management as well as disease specific treatment.  This will be further explored at the next office visit.  She will continue to work on reducing saturated fats and simple and added sugars in diet.

## 2023-08-23 NOTE — Progress Notes (Signed)
 Office: [phone]  /  Fax: [phone]  Weight Summary and Body Composition Analysis (BIA)  Vitals Temp: 98.7 F (37.1 C) BP: 124/86 Pulse Rate: 82 SpO2: 98 %   Anthropometric Measurements Height: 5' 6 (1.676 m) Weight: 244 lb (110.7 kg) BMI (Calculated): 39.4 Weight at Last Visit: 253 lb Weight Lost Since Last Visit: 9 lb Weight Gained Since Last Visit: 0 Starting Weight: 252 lb Total Weight Loss (lbs): 8 lb (3.629 kg) Peak Weight: 270 lb   Body Composition  Body Fat %: 44 % Fat Mass (lbs): 107.4 lbs Muscle Mass (lbs): 130 lbs Total Body Water (lbs): 94.4 lbs Visceral Fat Rating : 12    RMR: 1512  Today's Visit #: 5  Starting Date: 10/28/22   Subjective   Chief Complaint: Obesity  Interval History Discussed the use of AI scribe software for clinical note transcription with the patient, who gave verbal consent to proceed.  History of Present Illness   Jody Bryan is a 43 year old female who presents for medical weight management.  I have not seen her since November of last year.  She has been seen by other providers.  Since last time I saw her she has been started on Wellbutrin  SR for possible emotional eating.  She has lost nine pounds since her last visit in November, attributing this to following a 1200 calorie nutrition plan 70-80% of the time, eating more whole foods, and using meal replacements like premium protein shakes. She exercises four days a week for 20-30 minutes, primarily doing cardio. She occasionally skips meals and feels she is not getting the recommended amount of protein, though she maintains adequate hydration.  She has a history of prediabetes and is not currently on medication for it. Her blood pressure has improved but remains slightly elevated, with a recent reading of 134/88. She is not on blood pressure medication and is monitoring her sodium intake and continuing her weight loss efforts to manage it.  She  underwent a sleep study due to suspected sleep apnea. The study showed an apnea-hypopnea index (AHI) of three per hour, indicating no severe sleep apnea, but she did experience drops in oxygen levels, with the lowest being 80%. She has trouble sleeping and snores.  She is currently taking Wellbutrin  SR, but not daily. She takes it primarily on workdays to help with appetite control and to avoid eating desserts and high-calorie foods at her workplace, [employer]. The medication helps her say no to these foods, although it has not significantly affected her mood.  She has been seeing a physical therapist and an orthopedic specialist for knee issues, which have been a hindrance to her physical activity. Despite this, her body composition has improved, with a reduction in body fat percentage from 45% to 44% and visceral fat from 13 to 12.       Challenges affecting patient progress: slow metabolism for age and emotional eating.    Pharmacotherapy for weight management: She is currently taking Bupropion  (single agent, off label use) with adequate clinical response  and without side effects..   Assessment and Plan   Treatment Plan For Obesity:  Recommended Dietary Goals  Jody Bryan is currently in the action stage of change. As such, her goal is to continue weight management plan. She has agreed to: follow the Category 2 plan - 1200 kcal per day  Behavioral Health and Counseling  We discussed the following behavioral modification strategies today: continue to work on maintaining a reduced  calorie state, getting the recommended amount of protein, incorporating whole foods, making healthy choices, staying well hydrated and practicing mindfulness when eating..  Additional education and resources provided today: None  Recommended Physical Activity Goals  Jody Bryan has been advised to work up to 150 minutes of moderate intensity aerobic activity a week and strengthening exercises 2-3  times per week for cardiovascular health, weight loss maintenance and preservation of muscle mass.   She has agreed to :  continue to gradually increase the amount and intensity of exercise routine  Medical Interventions and Pharmacotherapy  We discussed various medication options to help Jody Bryan with her weight loss efforts and we both agreed to : Reviewed role of GLP-1 with patient today this will be further explored at the next office visit  Associated Conditions Impacted by Obesity Treatment  Prediabetes Assessment & Plan: Most recent A1c was 5.9.  I have not seen patient since November she has not been started on metformin and has an upward trending A1c.  She is at risk for progression.  We discussed today the role of GLP-1 for weight management as well as disease specific treatment.  This will be further explored at the next office visit.  She will continue to work on reducing saturated fats and simple and added sugars in diet.    Other depression, with emotional eating behavior -     buPROPion  HCl ER (SR); Take 1 tablet (150 mg total) by mouth in the morning.  Dispense: 30 tablet; Refill: 0  Elevated blood pressure reading without diagnosis of hypertension Assessment & Plan: Blood pressure is slightly elevated at 134/88 mmHg on repeat measurement, with the diastolic pressure being high. Wellbutrin  can raise blood pressure.  This was started by a colleague.  Emphasized the importance of monitoring blood pressure due to risks such as stroke, kidney disease, and heart failure. Optimal blood pressure is 120/80 mmHg. Weight loss and reduced sodium intake can help lower blood pressure. Discussed the potential need for low-dose antihypertensive medication if lifestyle changes do not suffice. - Monitor blood pressure three times a week, morning and before bedtime - Reduce sodium intake - Continue weight loss efforts - Consider low-dose antihypertensive medication if blood pressure remains  elevated   Suspected sleep apnea Assessment & Plan: I have reviewed sleep study does not show significant sleep disordered breathing.   Abnormal metabolism Assessment & Plan: Patient has a slower than predicted metabolism. IC 1512 vs. calculated 1948. This may contribute to weight gain, chronic fatigue and difficulty losing weight.   We reviewed measures to improve metabolism including not skipping meals, progressive strengthening exercises, increasing protein intake at every meal and maintaining adequate hydration and sleep.     Class 3 severe obesity with serious comorbidity and body mass index (BMI) of 40.0 to 44.9 in adult Assessment & Plan: Obesity is characterized by excess body fat. She has a slow metabolism and requires physical activity to aid weight loss. Her body fat percentage has decreased from 45% to 44%, and visceral fat has reduced from 13 to 12. The goal is to reduce body fat percentage to 34%. She has lost 9 pounds since the last visit, attributed to meal replacements and dietary changes. Obesity is a treatable disease, not curable, and requires a comprehensive approach including diet, exercise, and possibly pharmacotherapy. Discussed the potential use of GLP-1 agonist (Wegovy ) for weight management, which may also aid in managing prediabetes and blood pressure.  Considering degree of obesity and comorbidities she would benefit from aggressive  treatment with GLP-1. - Continue 1200 calorie nutrition plan - Continue exercise regimen 4 days a week, 20-30 minutes of cardio - Consider GLP-1 agonist (Wegovy ) for weight management - Discuss pharmacotherapy options in next visit - Schedule 40-minute follow-up appointment in 4 weeks       General Health Maintenance Emphasized the importance of reducing processed foods, maintaining adequate hydration, and ensuring sufficient protein intake to support metabolic rate and overall health. Discussed the impact of stress, sleep, and  food composition on energy regulation. - Reduce processed foods - Maintain adequate hydration - Ensure sufficient protein intake        Objective   Physical Exam:  Blood pressure 124/86, pulse 82, temperature 98.7 F (37.1 C), height 5' 6 (1.676 m), weight 244 lb (110.7 kg), SpO2 98%. Body mass index is 39.38 kg/m.  General: She is overweight, cooperative, alert, well developed, and in no acute distress. PSYCH: Has normal mood, affect and thought process.   HEENT: EOMI, sclerae are anicteric. Lungs: Normal breathing effort, no conversational dyspnea. Extremities: No edema.  Neurologic: No gross sensory or motor deficits. No tremors or fasciculations noted.    Diagnostic Data Reviewed:  BMET    Component Value Date/Time   NA 141 05/27/2023 1443   NA 142 10/28/2022 0925   K 4.4 05/27/2023 1443   CL 106 05/27/2023 1443   CO2 27 05/27/2023 1443   GLUCOSE 77 05/27/2023 1443   BUN 14 05/27/2023 1443   BUN 13 10/28/2022 0925   CREATININE 0.73 05/27/2023 1443   CREATININE 0.82 06/08/2014 1627   CALCIUM 9.3 05/27/2023 1443   GFRNONAA >60 08/27/2020 0745   GFRAA 133 02/22/2020 1039   Lab Results  Component Value Date   HGBA1C 5.9 05/27/2023   HGBA1C 5.7 06/11/2015   Lab Results  Component Value Date   INSULIN  11.6 10/28/2022   INSULIN  25.7 (H) 03/17/2017   Lab Results  Component Value Date   TSH 1.45 05/27/2023   CBC    Component Value Date/Time   WBC 11.1 (H) 05/27/2023 1443   RBC 4.65 05/27/2023 1443   HGB 14.2 05/27/2023 1443   HGB 12.4 06/19/2020 0829   HCT 42.8 05/27/2023 1443   HCT 36.2 06/19/2020 0829   PLT 364.0 05/27/2023 1443   PLT 355 06/19/2020 0829   MCV 92.2 05/27/2023 1443   MCV 94 06/19/2020 0829   MCH 33.0 08/27/2020 0745   MCHC 33.2 05/27/2023 1443   RDW 13.4 05/27/2023 1443   RDW 12.9 06/19/2020 0829   Iron Studies No results found for: IRON, TIBC, FERRITIN, IRONPCTSAT Lipid Panel     Component Value Date/Time   CHOL  173 05/18/2022 1543   CHOL 156 03/17/2017 1042   TRIG 127.0 05/18/2022 1543   HDL 61.70 05/18/2022 1543   HDL 47 03/17/2017 1042   CHOLHDL 3 05/18/2022 1543   VLDL 25.4 05/18/2022 1543   LDLCALC 86 05/18/2022 1543   LDLCALC 94 03/17/2017 1042   Hepatic Function Panel     Component Value Date/Time   PROT 6.8 05/27/2023 1443   PROT 7.0 10/28/2022 0925   ALBUMIN 4.1 05/27/2023 1443   ALBUMIN 4.4 10/28/2022 0925   AST 12 05/27/2023 1443   ALT 12 05/27/2023 1443   ALKPHOS 76 05/27/2023 1443   BILITOT 0.6 05/27/2023 1443   BILITOT 0.8 10/28/2022 0925   BILIDIR 0.2 06/11/2015 1024   IBILI 0.6 06/08/2014 1627      Component Value Date/Time   TSH 1.45 05/27/2023 1443  Nutritional Lab Results  Component Value Date   VD25OH 23.57 (L) 05/27/2023   VD25OH 17.44 (L) 05/18/2022   VD25OH 24.61 (L) 08/12/2021    Medications: Outpatient Encounter Medications as of 08/23/2023  Medication Sig   Cholecalciferol (VITAMIN D -3) 25 MCG (1000 UT) CAPS Take by mouth.   levonorgestrel  (MIRENA ) 20 MCG/DAY IUD by Intrauterine route.   Melatonin 10 MG TABS Take by mouth.   meloxicam  (MOBIC ) 15 MG tablet Take 1 tablet (15 mg total) by mouth daily.   Vitamin D , Ergocalciferol , (DRISDOL ) 1.25 MG (50000 UNIT) CAPS capsule TAKE 1 CAPSULE (50,000 UNITS TOTAL) BY MOUTH EVERY 7 (SEVEN) DAYS   [DISCONTINUED] buPROPion  (WELLBUTRIN  SR) 150 MG 12 hr tablet Take 1 tablet (150 mg total) by mouth in the morning.   buPROPion  (WELLBUTRIN  SR) 150 MG 12 hr tablet Take 1 tablet (150 mg total) by mouth in the morning.   No facility-administered encounter medications on file as of 08/23/2023.     Follow-Up   Return in about 4 weeks (around 09/20/2023) for For Weight Mangement with Dr. Francyne - 40 minutes.. She was informed of the importance of frequent follow up visits to maximize her success with intensive lifestyle modifications for her multiple health conditions.  Attestation Statement   Reviewed by  clinician on day of visit: allergies, medications, problem list, medical history, surgical history, family history, social history, and previous encounter notes.     Lucas Francyne, MD

## 2023-09-06 ENCOUNTER — Encounter: Payer: Self-pay | Admitting: Physical Therapy

## 2023-09-06 ENCOUNTER — Ambulatory Visit: Attending: Orthopedic Surgery | Admitting: Physical Therapy

## 2023-09-06 DIAGNOSIS — M25562 Pain in left knee: Secondary | ICD-10-CM | POA: Insufficient documentation

## 2023-09-06 DIAGNOSIS — M25561 Pain in right knee: Secondary | ICD-10-CM | POA: Insufficient documentation

## 2023-09-06 DIAGNOSIS — M6281 Muscle weakness (generalized): Secondary | ICD-10-CM | POA: Diagnosis not present

## 2023-09-06 DIAGNOSIS — G8929 Other chronic pain: Secondary | ICD-10-CM | POA: Diagnosis not present

## 2023-09-06 DIAGNOSIS — R2689 Other abnormalities of gait and mobility: Secondary | ICD-10-CM | POA: Diagnosis not present

## 2023-09-06 NOTE — Therapy (Signed)
 OUTPATIENT PHYSICAL THERAPY TREATMENT   Patient Name: Jody Bryan MRN: [id number] DOB:09/12/1980, 43 y.o., female Today's Date: 09/06/2023   END OF SESSION:  PT End of Session - 09/06/23 0809     Visit Number 2    Date for PT Re-Evaluation 10/18/23    Authorization Type [facility] Medicaid    Authorization Time Period 08/23/23 - 10/21/23    Authorization - Visit Number 2    Authorization - Number of Visits 6    PT Start Time 0809   Pt arrived late   PT Stop Time 0847    PT Time Calculation (min) 38 min    Activity Tolerance Patient tolerated treatment well    Behavior During Therapy WFL for tasks assessed/performed           Past Medical History:  Diagnosis Date   Arthritis    Bilateral leg edema    Chicken pox    Depression    Drug use    Dyspnea    Low back pain    Past Surgical History:  Procedure Laterality Date   WISDOM TOOTH EXTRACTION  08/2019   Patient Active Problem List   Diagnosis Date Noted   Elevated blood pressure reading without diagnosis of hypertension 08/23/2023   Depression 08/23/2023   Snoring 02/01/2023   Hx gestational diabetes 10/28/2022   Suspected sleep apnea 10/28/2022   Abnormal metabolism 10/28/2022   Leukocytosis 06/29/2022   Urinary retention 05/28/2022   Allergic conjunctivitis of both eyes 05/28/2022   Urinary frequency 05/28/2022   Contraception, device intrauterine 05/18/2022   Vitamin B12 deficiency 05/18/2022   Inguinal pain 08/12/2021   Class 3 severe obesity with serious comorbidity and body mass index (BMI) of 40.0 to 44.9 in adult 07/11/2021   De Quervain's tenosynovitis, bilateral 01/17/2021   Renal cyst 12/22/2019   Bulging lumbar disc 12/22/2019   Preventative health care 08/09/2017   Vitamin D  deficiency 04/01/2017   Prediabetes 04/01/2017   Other fatigue 03/17/2017   Low back pain with radiation 07/23/2014   Right wrist pain 09/05/2012   Meralgia paraesthetica 05/25/2012    PCP: Antonio Cyndee Jamee JONELLE, DO   REFERRING PROVIDER: Liam Lerner, MD   REFERRING DIAG: M22.2X1,M22.2X2 (ICD-10-CM) - Bilateral patellofemoral stress syndrome   THERAPY DIAG:  Chronic pain of both knees  Muscle weakness (generalized)  Other abnormalities of gait and mobility  RATIONALE FOR EVALUATION AND TREATMENT: Rehabilitation  ONSET DATE: ~3 months  NEXT MD VISIT: To be scheduled pending outcome of PT   SUBJECTIVE:  SUBJECTIVE STATEMENT: Pt reports she just woke up this morning, but denies any pain today. HEP going fairly ok, but was not able to do strap stretches as she could not find the dog leash she intended to use.  EVAL:  Pt reports she just woke up and her knees were hurting potentially from altering her mobility due to her back pain. Pain is constant, even when sleeping. Initially L knee was more painful, but now R knee is more painful and swollen.  Sometimes her knees feel like they wan to collapse when she is walking.  PAIN: Are you having pain? Yes: NPRS scale: 0/10  Pain location: B anteromedial knees Pain description: constant dull ache; occasional sharp, shooting pain  Aggravating factors: prolonged sitting or standing, squatting  Relieving factors: walking   PERTINENT HISTORY:  Arthritis, LBP, GERD, PreDiabetes, obesity, depression, fatigue, dyspnea  PRECAUTIONS: None  RED FLAGS: None  WEIGHT BEARING RESTRICTIONS: No  FALLS:  Has patient fallen in last 6 months? No  LIVING ENVIRONMENT:  Lives with: lives with their family Lives in: House/apartment Stairs: Yes: External: 2-3 steps; on left going up Has following equipment at home: None  OCCUPATION: [employer], will be switching to working for [employer] from home as of July  PLOF: Independent and Leisure: Has young  children, walking 20-30 min/day   PATIENT GOALS: To be able to squat again and walk w/o pain. Also be able to sit w/o pain.   OBJECTIVE: (objective measures completed at initial evaluation unless otherwise dated)  DIAGNOSTIC FINDINGS:  05/27/23 - DG knee 1-2 views right and left IMPRESSION: Minimal medial and patellofemoral compartment osteoarthritis.    PATIENT SURVEYS:  LEFS: 27 / 80 = 33.8 %, Moderate functional limitation  COGNITION: Overall cognitive status: Within functional limits for tasks assessed    SENSATION: WFL Tingling in back of the knees at nighttime, R>L  EDEMA:  Circumferential:    Right eval Left eval  At joint line 50.0 cm 46.5 cm  6 cm above 53.0 cm 51.5 cm  6 cm below 42.0 cm 41.0 cm   POSTURE:  anterior pelvic tilt and L>R genu recurvatum  PALPATION: Mildly limited patellar mobility R>L  MUSCLE LENGTH: Hamstrings: mod tight R, mild tight L ITB: mild tight B Piriformis: mod tight B Hip flexors: mild tight B Quads: mod tight B Heelcord: mod tight R, mild tight L  LOWER EXTREMITY ROM:  Active ROM Right eval Left eval  Knee flexion 120 125  Knee extension 14 4   Passive ROM Right eval Left eval  Knee flexion 122 134  Knee extension 0 -8  (Blank rows = not tested)  LOWER EXTREMITY MMT:  MMT Right eval Left eval  Hip flexion 4+ 4+  Hip extension 4 4  Hip abduction 4+ 4  Hip adduction 4- 4  Hip internal rotation 4+ 4+  Hip external rotation 3+ p! 4-  Knee flexion 4 4+  Knee extension 4- p! 4+  Ankle dorsiflexion 5 5  Ankle plantarflexion    Ankle inversion    Ankle eversion     (Blank rows = not tested)  LOWER EXTREMITY SPECIAL TESTS:  Knee special tests: Lateral pull sign: positive B Patellofemoral grind test: positive on R   TODAY'S TREATMENT:   09/06/2023  THERAPEUTIC EXERCISE: To improve strength, endurance, and flexibility.  Demonstration, verbal and tactile cues throughout for technique.  NuStep - L4 x 6 min Supine  HS stretch with strap 2 x 30 bil Supine ITB stretch with  strap 2 x 30 bil Mod thomas hip flexor stretch with strap 2 x 30 bil Prone hip flexor & quad stretch with strap 2 x 30 bil Supine quad set on towel roll 10 x 5 bil  NEUROMUSCULAR RE-EDUCATION: To improve coordination and kinesthesia. B SAQ over 6 foam roll with ball squeeze between ankles 10 x 3   08/23/2023 - Eval  SELF CARE:  Reviewed eval findings and role of PT in addressing identified deficits as well as instruction in initial HEP (see below).    PATIENT EDUCATION:  Education details: HEP review and review of standing posture to avoid genu recurvatum in standing  Person educated: Patient Education method: Explanation, Demonstration, Verbal cues, and Tactile cues Education comprehension: verbalized understanding, returned demonstration, verbal cues required, tactile cues required, and needs further education  HOME EXERCISE PROGRAM: Access Code: [email or web address] URL: [email or web address] Date: 08/23/2023 Prepared by: Elijah Hidden  Exercises - Supine Hamstring Stretch with Strap  - 2-3 x daily - 7 x weekly - 3 reps - 30 sec hold - Supine Iliotibial Band Stretch with Strap  - 2-3 x daily - 7 x weekly - 3 reps - 30 sec hold - Hip Flexor Stretch with Strap on Table  - 2-3 x daily - 7 x weekly - 3 reps - 30 sec hold - Prone Hip Flexor & Quad Stretch with Strap  - 2-3 x daily - 7 x weekly - 3 reps - 30 sec hold - Supine Quad Set on Towel Roll  - 2 x daily - 7 x weekly - 2 sets - 10 reps - 5 sec hold   ASSESSMENT:  CLINICAL IMPRESSION: Jody Bryan denies pain today.  She reports limited compliance with HEP, not completing any of the stretches with a strap as she could not find the dog leash she intended to use.  HEP reviewed providing clarification of technique, hold times and proper positioning to achieve desired stretches.  Pt denied need for instruction in alternative versions of the stretches, stating she  will try using a belt at home. Progression of exercises limited due to patient's late arrival to appointment, but will plan for further strengthening progression in upcoming visits.  Bryan will benefit from continued skilled PT to address ongoing flexibility and strength deficits to improve mobility and activity tolerance with decreased pain interference.    EVAL:  Jody Bryan is a 43 y.o. female who was referred to physical therapy for evaluation and treatment for B patellofemoral stress syndrome.  Patient recently completed 6 visits of PT at this clinic for low back pain from 04/08/2023 to 05/12/2023, however did not formally complete the episode as she no showed for her final scheduled visit.  She reports onset of B knee pain ~3 months ago which she attributes potentially to altering her mobility related to her low back pain.  Pain is worse with prolonged sitting or standing.  Patient has deficits in B knee ROM, B proximal LE flexibility, B LE strength, and abnormal posture which are interfering with ADLs and are impacting quality of life.  On LEFS patient scored 27/80 demonstrating moderate functional limitation.  Jody Bryan will benefit from skilled PT to address above deficits to improve mobility and activity tolerance with decreased pain interference.  OBJECTIVE IMPAIRMENTS: Abnormal gait, decreased activity tolerance, decreased balance, decreased coordination, decreased endurance, decreased knowledge of condition, decreased mobility, difficulty walking, decreased ROM, decreased strength, increased fascial restrictions, impaired perceived functional ability, increased muscle spasms, impaired flexibility, improper body mechanics, postural dysfunction, and  pain.   ACTIVITY LIMITATIONS: lifting, sitting, standing, squatting, sleeping, stairs, transfers, bed mobility, and locomotion level  PARTICIPATION LIMITATIONS: meal prep, cleaning, laundry, driving, shopping, community activity, and  occupation  PERSONAL FACTORS: Fitness, Past/current experiences, Time since onset of injury/illness/exacerbation, and 3+ comorbidities: Arthritis, LBP, GERD, PreDiabetes, obesity, depression, fatigue, dyspnea are also affecting patient's functional outcome.   REHAB POTENTIAL: Good  CLINICAL DECISION MAKING: Evolving/moderate complexity  EVALUATION COMPLEXITY: Moderate   GOALS: Goals reviewed with patient? Yes  SHORT TERM GOALS: Target date: 09/20/2023  Patient will be independent with initial HEP. Baseline: Initial HEP provided on eval Goal status: IN PROGRESS - 09/06/23 - Pt returning for 1st visit since eval. Limited compliance with HEP (not able to complete strap stretches). All exercises reviewed today.  2.  Patient will report at least 25% improvement in B knee pain to improve QOL. Baseline: 7/10 Goal status: INITIAL  3.  Patient will demonstrate neutral B knee alignment in stance without genu recurvatum. Baseline:  Goal status: INITIAL   LONG TERM GOALS: Target date: 10/18/2023  Patient will be independent with advanced/ongoing HEP to improve outcomes and carryover.  Baseline:  Goal status: INITIAL  2.  Patient will report at least 50-75% improvement in B knee pain to improve QOL. Baseline: 7/10 Goal status: INITIAL  3.  Patient will demonstrate improved B knee AROM to >/= 0-125 deg to allow for normal gait and stair mechanics. Baseline: Refer to above LE ROM table Goal status: INITIAL  4.  Patient will demonstrate improved B LE strength to >/= 4+/5 for improved stability and ease of mobility. Baseline: Refer to above LE MMT table Goal status: INITIAL  5.  Patient will be able to ambulate 600' with normal gait pattern without increased pain to access community.  Baseline:  Goal status: INITIAL  6. Patient will be able to ascend/descend stairs with 1 HR and reciprocal step pattern safely to access home and community.  Baseline:  Goal status: INITIAL  7.   Patient will report >/= 36/80 on LEFS (MCID = 9 pts) to demonstrate improved functional ability. Baseline: 27 / 80 = 33.8 % Goal status: INITIAL   PLAN:  PT FREQUENCY: 2x/week  PT DURATION: 8 weeks  PLANNED INTERVENTIONS: 97164- PT Re-evaluation, 97750- Physical Performance Testing, 97110-Therapeutic exercises, 97530- Therapeutic activity, 97112- Neuromuscular re-education, 97535- Self Care, 02859- Manual therapy, [phone]- Gait training, [phone]- Aquatic Therapy, [phone] (1-2 muscles), 20561 (3+ muscles)- Dry Needling, Patient/Family education, Balance training, Stair training, Taping, Joint mobilization, DME instructions, Cryotherapy, and Moist heat  PLAN FOR NEXT SESSION: progress proximal LE flexibility especially in quads; progress hip and knee strengthening with emphasis on quads especially VMO; update HEP accordingly   Elijah CHRISTELLA Hidden, PT 09/06/2023, 8:47 AM

## 2023-09-07 ENCOUNTER — Ambulatory Visit (INDEPENDENT_AMBULATORY_CARE_PROVIDER_SITE_OTHER): Admitting: Family Medicine

## 2023-09-08 ENCOUNTER — Ambulatory Visit

## 2023-09-08 DIAGNOSIS — M25561 Pain in right knee: Secondary | ICD-10-CM | POA: Diagnosis not present

## 2023-09-08 DIAGNOSIS — R2689 Other abnormalities of gait and mobility: Secondary | ICD-10-CM

## 2023-09-08 DIAGNOSIS — M6281 Muscle weakness (generalized): Secondary | ICD-10-CM

## 2023-09-08 DIAGNOSIS — G8929 Other chronic pain: Secondary | ICD-10-CM

## 2023-09-08 DIAGNOSIS — M25562 Pain in left knee: Secondary | ICD-10-CM | POA: Diagnosis not present

## 2023-09-08 NOTE — Therapy (Signed)
 OUTPATIENT PHYSICAL THERAPY TREATMENT   Patient Name: Jody Bryan MRN: [id number] DOB:1980/05/19, 43 y.o., female Today's Date: 09/08/2023   END OF SESSION:  PT End of Session - 09/08/23 0858     Visit Number 3    Date for PT Re-Evaluation 10/18/23    Authorization Type [employer] Medicaid    Authorization Time Period 08/23/23 - 10/21/23    Authorization - Visit Number 3    Authorization - Number of Visits 6    PT Start Time 0856   pt late   PT Stop Time 0932    PT Time Calculation (min) 36 min    Activity Tolerance Patient tolerated treatment well    Behavior During Therapy WFL for tasks assessed/performed            Past Medical History:  Diagnosis Date   Arthritis    Bilateral leg edema    Chicken pox    Depression    Drug use    Dyspnea    Low back pain    Past Surgical History:  Procedure Laterality Date   WISDOM TOOTH EXTRACTION  08/2019   Patient Active Problem List   Diagnosis Date Noted   Elevated blood pressure reading without diagnosis of hypertension 08/23/2023   Depression 08/23/2023   Snoring 02/01/2023   Hx gestational diabetes 10/28/2022   Suspected sleep apnea 10/28/2022   Abnormal metabolism 10/28/2022   Leukocytosis 06/29/2022   Urinary retention 05/28/2022   Allergic conjunctivitis of both eyes 05/28/2022   Urinary frequency 05/28/2022   Contraception, device intrauterine 05/18/2022   Vitamin B12 deficiency 05/18/2022   Inguinal pain 08/12/2021   Class 3 severe obesity with serious comorbidity and body mass index (BMI) of 40.0 to 44.9 in adult 07/11/2021   De Quervain's tenosynovitis, bilateral 01/17/2021   Renal cyst 12/22/2019   Bulging lumbar disc 12/22/2019   Preventative health care 08/09/2017   Vitamin D  deficiency 04/01/2017   Prediabetes 04/01/2017   Other fatigue 03/17/2017   Low back pain with radiation 07/23/2014   Right wrist pain 09/05/2012   Meralgia paraesthetica 05/25/2012    PCP: Antonio Cyndee Jamee JONELLE, DO    REFERRING PROVIDER: Liam Lerner, MD   REFERRING DIAG: M22.2X1,M22.2X2 (ICD-10-CM) - Bilateral patellofemoral stress syndrome   THERAPY DIAG:  Chronic pain of both knees  Muscle weakness (generalized)  Other abnormalities of gait and mobility  RATIONALE FOR EVALUATION AND TREATMENT: Rehabilitation  ONSET DATE: ~3 months  NEXT MD VISIT: To be scheduled pending outcome of PT   SUBJECTIVE:  SUBJECTIVE STATEMENT: Pt reports not waking up at night with pain as much, HEP doing well  EVAL:  Pt reports she just woke up and her knees were hurting potentially from altering her mobility due to her back pain. Pain is constant, even when sleeping. Initially L knee was more painful, but now R knee is more painful and swollen.  Sometimes her knees feel like they wan to collapse when she is walking.  PAIN: Are you having pain? Yes: NPRS scale: 5/10  Pain location: R anterior knee Pain description: constant dull ache; occasional sharp, shooting pain  Aggravating factors: prolonged sitting or standing, squatting  Relieving factors: walking   PERTINENT HISTORY:  Arthritis, LBP, GERD, PreDiabetes, obesity, depression, fatigue, dyspnea  PRECAUTIONS: None  RED FLAGS: None  WEIGHT BEARING RESTRICTIONS: No  FALLS:  Has patient fallen in last 6 months? No  LIVING ENVIRONMENT:  Lives with: lives with their family Lives in: House/apartment Stairs: Yes: External: 2-3 steps; on left going up Has following equipment at home: None  OCCUPATION: [employer], will be switching to working for [employer] from home as of July  PLOF: Independent and Leisure: Has young children, walking 20-30 min/day   PATIENT GOALS: To be able to squat again and walk w/o pain. Also be able to sit w/o  pain.   OBJECTIVE: (objective measures completed at initial evaluation unless otherwise dated)  DIAGNOSTIC FINDINGS:  05/27/23 - DG knee 1-2 views right and left IMPRESSION: Minimal medial and patellofemoral compartment osteoarthritis.    PATIENT SURVEYS:  LEFS: 27 / 80 = 33.8 %, Moderate functional limitation  COGNITION: Overall cognitive status: Within functional limits for tasks assessed    SENSATION: WFL Tingling in back of the knees at nighttime, R>L  EDEMA:  Circumferential:    Right eval Left eval  At joint line 50.0 cm 46.5 cm  6 cm above 53.0 cm 51.5 cm  6 cm below 42.0 cm 41.0 cm   POSTURE:  anterior pelvic tilt and L>R genu recurvatum  PALPATION: Mildly limited patellar mobility R>L  MUSCLE LENGTH: Hamstrings: mod tight R, mild tight L ITB: mild tight B Piriformis: mod tight B Hip flexors: mild tight B Quads: mod tight B Heelcord: mod tight R, mild tight L  LOWER EXTREMITY ROM:  Active ROM Right eval Left eval  Knee flexion 120 125  Knee extension 14 4   Passive ROM Right eval Left eval  Knee flexion 122 134  Knee extension 0 -8  (Blank rows = not tested)  LOWER EXTREMITY MMT:  MMT Right eval Left eval  Hip flexion 4+ 4+  Hip extension 4 4  Hip abduction 4+ 4  Hip adduction 4- 4  Hip internal rotation 4+ 4+  Hip external rotation 3+ p! 4-  Knee flexion 4 4+  Knee extension 4- p! 4+  Ankle dorsiflexion 5 5  Ankle plantarflexion    Ankle inversion    Ankle eversion     (Blank rows = not tested)  LOWER EXTREMITY SPECIAL TESTS:  Knee special tests: Lateral pull sign: positive B Patellofemoral grind test: positive on R   TODAY'S TREATMENT:  09/08/23 NEUROMUSCULAR RE-EDUCATION: To improve coordination, kinesthesia, posture, and proprioception.  KT tape for patellar tracking- 2 pieces starting from tibial tuberosity going to mid thigh Post KT tape:  Seated LAQ + VMO ball squeeze 2x10 B  Bridge + VMO ball squeeze 2x10  THERAPEUTIC  EXERCISE: To improve strength, endurance, ROM, and flexibility.  Nustep L4x5min Post KT tape:  Seated  HS curl GTB 2x10 B  R/L SLR + ER x 10  09/06/2023  THERAPEUTIC EXERCISE: To improve strength, endurance, and flexibility.  Demonstration, verbal and tactile cues throughout for technique.  NuStep - L4 x 6 min Supine HS stretch with strap 2 x 30 bil Supine ITB stretch with strap 2 x 30 bil Mod thomas hip flexor stretch with strap 2 x 30 bil Prone hip flexor & quad stretch with strap 2 x 30 bil Supine quad set on towel roll 10 x 5 bil  NEUROMUSCULAR RE-EDUCATION: To improve coordination and kinesthesia. B SAQ over 6 foam roll with ball squeeze between ankles 10 x 3   08/23/2023 - Eval  SELF CARE:  Reviewed eval findings and role of PT in addressing identified deficits as well as instruction in initial HEP (see below).    PATIENT EDUCATION:  Education details: HEP review and review of standing posture to avoid genu recurvatum in standing  Person educated: Patient Education method: Explanation, Demonstration, Verbal cues, and Tactile cues Education comprehension: verbalized understanding, returned demonstration, verbal cues required, tactile cues required, and needs further education  HOME EXERCISE PROGRAM: Access Code: [email or web address] URL: [email or web address] Date: 09/08/2023 Prepared by: Corde Antonini  Exercises - Supine Hamstring Stretch with Strap  - 2-3 x daily - 7 x weekly - 3 reps - 30 sec hold - Supine Iliotibial Band Stretch with Strap  - 2-3 x daily - 7 x weekly - 3 reps - 30 sec hold - Hip Flexor Stretch with Strap on Table  - 2-3 x daily - 7 x weekly - 3 reps - 30 sec hold - Prone Hip Flexor & Quad Stretch with Strap  - 2-3 x daily - 7 x weekly - 3 reps - 30 sec hold - Supine Quad Set on Towel Roll  - 2 x daily - 7 x weekly - 2 sets - 10 reps - 5 sec hold - Seated Long Arc Quad with Hip Adduction  - 1 x daily - 3-4 x weekly - 2 sets - 10 reps - Seated  Hamstring Curl with Anchored Resistance  - 1 x daily - 3-4 x weekly - 2 sets - 10 reps - Supine Bridge with Mini Swiss Ball Between Knees  - 1 x daily - 3-4 x weekly - 2 sets - 10 reps   ASSESSMENT:  CLINICAL IMPRESSION: Progressed with strengthening specifically for quads and VMO. Applied KT tape for patellar stabilization which helped and provided info on this as well. Updated HEP for strengthening as well. Pt responded well to treatment. Jody Bryan will benefit from continued skilled PT to address ongoing flexibility and strength deficits to improve mobility and activity tolerance with decreased pain interference.    EVAL:  Marca Barcomb is a 43 y.o. female who was referred to physical therapy for evaluation and treatment for B patellofemoral stress syndrome.  Patient recently completed 6 visits of PT at this clinic for low back pain from 04/08/2023 to 05/12/2023, however did not formally complete the episode as she no showed for her final scheduled visit.  She reports onset of B knee pain ~3 months ago which she attributes potentially to altering her mobility related to her low back pain.  Pain is worse with prolonged sitting or standing.  Patient has deficits in B knee ROM, B proximal LE flexibility, B LE strength, and abnormal posture which are interfering with ADLs and are impacting quality of life.  On LEFS patient scored 27/80 demonstrating moderate functional limitation.  Aysiah Jody Bryan  will benefit from skilled PT to address above deficits to improve mobility and activity tolerance with decreased pain interference.  OBJECTIVE IMPAIRMENTS: Abnormal gait, decreased activity tolerance, decreased balance, decreased coordination, decreased endurance, decreased knowledge of condition, decreased mobility, difficulty walking, decreased ROM, decreased strength, increased fascial restrictions, impaired perceived functional ability, increased muscle spasms, impaired flexibility, improper body mechanics,  postural dysfunction, and pain.   ACTIVITY LIMITATIONS: lifting, sitting, standing, squatting, sleeping, stairs, transfers, bed mobility, and locomotion level  PARTICIPATION LIMITATIONS: meal prep, cleaning, laundry, driving, shopping, community activity, and occupation  PERSONAL FACTORS: Fitness, Past/current experiences, Time since onset of injury/illness/exacerbation, and 3+ comorbidities: Arthritis, LBP, GERD, PreDiabetes, obesity, depression, fatigue, dyspnea are also affecting patient's functional outcome.   REHAB POTENTIAL: Good  CLINICAL DECISION MAKING: Evolving/moderate complexity  EVALUATION COMPLEXITY: Moderate   GOALS: Goals reviewed with patient? Yes  SHORT TERM GOALS: Target date: 09/20/2023  Patient will be independent with initial HEP. Baseline: Initial HEP provided on eval Goal status: IN PROGRESS - 09/06/23 - Pt returning for 1st visit since eval. Limited compliance with HEP (not able to complete strap stretches). All exercises reviewed today.  2.  Patient will report at least 25% improvement in B knee pain to improve QOL. Baseline: 7/10 Goal status: INITIAL  3.  Patient will demonstrate neutral B knee alignment in stance without genu recurvatum. Baseline:  Goal status: INITIAL   LONG TERM GOALS: Target date: 10/18/2023  Patient will be independent with advanced/ongoing HEP to improve outcomes and carryover.  Baseline:  Goal status: INITIAL  2.  Patient will report at least 50-75% improvement in B knee pain to improve QOL. Baseline: 7/10 Goal status: INITIAL  3.  Patient will demonstrate improved B knee AROM to >/= 0-125 deg to allow for normal gait and stair mechanics. Baseline: Refer to above LE ROM table Goal status: INITIAL  4.  Patient will demonstrate improved B LE strength to >/= 4+/5 for improved stability and ease of mobility. Baseline: Refer to above LE MMT table Goal status: INITIAL  5.  Patient will be able to ambulate 600' with normal  gait pattern without increased pain to access community.  Baseline:  Goal status: INITIAL  6. Patient will be able to ascend/descend stairs with 1 HR and reciprocal step pattern safely to access home and community.  Baseline:  Goal status: INITIAL  7.  Patient will report >/= 36/80 on LEFS (MCID = 9 pts) to demonstrate improved functional ability. Baseline: 27 / 80 = 33.8 % Goal status: INITIAL   PLAN:  PT FREQUENCY: 2x/week  PT DURATION: 8 weeks  PLANNED INTERVENTIONS: 97164- PT Re-evaluation, 97750- Physical Performance Testing, 97110-Therapeutic exercises, 97530- Therapeutic activity, 97112- Neuromuscular re-education, 97535- Self Care, [id number]- Manual therapy, [phone]- Gait training, [phone]- Aquatic Therapy, [phone] (1-2 muscles), [id number] (3+ muscles)- Dry Needling, Patient/Family education, Balance training, Stair training, Taping, Joint mobilization, DME instructions, Cryotherapy, and Moist heat  PLAN FOR NEXT SESSION: progress proximal LE flexibility especially in quads; progress hip and knee strengthening with emphasis on quads especially VMO; update HEP accordingly   Sol LITTIE Gaskins, PTA 09/08/2023, 9:33 AM

## 2023-09-13 ENCOUNTER — Ambulatory Visit

## 2023-09-13 DIAGNOSIS — G8929 Other chronic pain: Secondary | ICD-10-CM

## 2023-09-13 DIAGNOSIS — R2689 Other abnormalities of gait and mobility: Secondary | ICD-10-CM | POA: Diagnosis not present

## 2023-09-13 DIAGNOSIS — M6281 Muscle weakness (generalized): Secondary | ICD-10-CM | POA: Diagnosis not present

## 2023-09-13 DIAGNOSIS — M25562 Pain in left knee: Secondary | ICD-10-CM | POA: Diagnosis not present

## 2023-09-13 DIAGNOSIS — M25561 Pain in right knee: Secondary | ICD-10-CM | POA: Diagnosis not present

## 2023-09-13 NOTE — Therapy (Signed)
 OUTPATIENT PHYSICAL THERAPY TREATMENT   Patient Name: Jody Bryan MRN: [id number] DOB:01-03-1981, 43 y.o., female Today's Date: 09/13/2023   END OF SESSION:  PT End of Session - 09/13/23 1616     Visit Number 4    Date for PT Re-Evaluation 10/18/23    Authorization Type [facility] Medicaid    Authorization Time Period 08/23/23 - 10/21/23    Authorization - Visit Number 4    Authorization - Number of Visits 6    PT Start Time 1545   pt late   PT Stop Time 1616    PT Time Calculation (min) 31 min    Activity Tolerance Patient tolerated treatment well    Behavior During Therapy WFL for tasks assessed/performed             Past Medical History:  Diagnosis Date   Arthritis    Bilateral leg edema    Chicken pox    Depression    Drug use    Dyspnea    Low back pain    Past Surgical History:  Procedure Laterality Date   WISDOM TOOTH EXTRACTION  08/2019   Patient Active Problem List   Diagnosis Date Noted   Elevated blood pressure reading without diagnosis of hypertension 08/23/2023   Depression 08/23/2023   Snoring 02/01/2023   Hx gestational diabetes 10/28/2022   Suspected sleep apnea 10/28/2022   Abnormal metabolism 10/28/2022   Leukocytosis 06/29/2022   Urinary retention 05/28/2022   Allergic conjunctivitis of both eyes 05/28/2022   Urinary frequency 05/28/2022   Contraception, device intrauterine 05/18/2022   Vitamin B12 deficiency 05/18/2022   Inguinal pain 08/12/2021   Class 3 severe obesity with serious comorbidity and body mass index (BMI) of 40.0 to 44.9 in adult 07/11/2021   De Quervain's tenosynovitis, bilateral 01/17/2021   Renal cyst 12/22/2019   Bulging lumbar disc 12/22/2019   Preventative health care 08/09/2017   Vitamin D  deficiency 04/01/2017   Prediabetes 04/01/2017   Other fatigue 03/17/2017   Low back pain with radiation 07/23/2014   Right wrist pain 09/05/2012   Meralgia paraesthetica 05/25/2012    PCP: Antonio Cyndee Jamee JONELLE,  DO   REFERRING PROVIDER: Liam Lerner, MD   REFERRING DIAG: M22.2X1,M22.2X2 (ICD-10-CM) - Bilateral patellofemoral stress syndrome   THERAPY DIAG:  Chronic pain of both knees  Muscle weakness (generalized)  Other abnormalities of gait and mobility  RATIONALE FOR EVALUATION AND TREATMENT: Rehabilitation  ONSET DATE: ~3 months  NEXT MD VISIT: To be scheduled pending outcome of PT   SUBJECTIVE:  SUBJECTIVE STATEMENT: Pt reports pain is better, not really having any right now.  EVAL:  Pt reports she just woke up and her knees were hurting potentially from altering her mobility due to her back pain. Pain is constant, even when sleeping. Initially L knee was more painful, but now R knee is more painful and swollen.  Sometimes her knees feel like they wan to collapse when she is walking.  PAIN: Are you having pain? Yes: NPRS scale: 0/10  Pain location: R anterior knee Pain description: constant dull ache; occasional sharp, shooting pain  Aggravating factors: prolonged sitting or standing, squatting  Relieving factors: walking   PERTINENT HISTORY:  Arthritis, LBP, GERD, PreDiabetes, obesity, depression, fatigue, dyspnea  PRECAUTIONS: None  RED FLAGS: None  WEIGHT BEARING RESTRICTIONS: No  FALLS:  Has patient fallen in last 6 months? No  LIVING ENVIRONMENT:  Lives with: lives with their family Lives in: House/apartment Stairs: Yes: External: 2-3 steps; on left going up Has following equipment at home: None  OCCUPATION: [employer], will be switching to working for [employer] from home as of July  PLOF: Independent and Leisure: Has young children, walking 20-30 min/day   PATIENT GOALS: To be able to squat again and walk w/o pain. Also be able to sit w/o  pain.   OBJECTIVE: (objective measures completed at initial evaluation unless otherwise dated)  DIAGNOSTIC FINDINGS:  05/27/23 - DG knee 1-2 views right and left IMPRESSION: Minimal medial and patellofemoral compartment osteoarthritis.    PATIENT SURVEYS:  LEFS: 27 / 80 = 33.8 %, Moderate functional limitation  COGNITION: Overall cognitive status: Within functional limits for tasks assessed    SENSATION: WFL Tingling in back of the knees at nighttime, R>L  EDEMA:  Circumferential:    Right eval Left eval  At joint line 50.0 cm 46.5 cm  6 cm above 53.0 cm 51.5 cm  6 cm below 42.0 cm 41.0 cm   POSTURE:  anterior pelvic tilt and L>R genu recurvatum  PALPATION: Mildly limited patellar mobility R>L  MUSCLE LENGTH: Hamstrings: mod tight R, mild tight L ITB: mild tight B Piriformis: mod tight B Hip flexors: mild tight B Quads: mod tight B Heelcord: mod tight R, mild tight L  LOWER EXTREMITY ROM:  Active ROM Right eval Left eval  Knee flexion 120 125  Knee extension 14 4   Passive ROM Right eval Left eval  Knee flexion 122 134  Knee extension 0 -8  (Blank rows = not tested)  LOWER EXTREMITY MMT:  MMT Right eval Left eval  Hip flexion 4+ 4+  Hip extension 4 4  Hip abduction 4+ 4  Hip adduction 4- 4  Hip internal rotation 4+ 4+  Hip external rotation 3+ p! 4-  Knee flexion 4 4+  Knee extension 4- p! 4+  Ankle dorsiflexion 5 5  Ankle plantarflexion    Ankle inversion    Ankle eversion     (Blank rows = not tested)  LOWER EXTREMITY SPECIAL TESTS:  Knee special tests: Lateral pull sign: positive B Patellofemoral grind test: positive on R   TODAY'S TREATMENT:  09/13/23 NEUROMUSCULAR RE-EDUCATION: To improve coordination, kinesthesia, posture, and proprioception.  KT tape chondromalacia pattern for patella Lateral band walk GTB at knees 2x70ft  Monster walk GTB at knees 2x13ft Sit to stand with VMO ball squeeze x 10 Seated R LAQ + VMO ball squeeze  2x10  THERAPEUTIC EXERCISE: To improve strength, endurance, ROM, and flexibility.  Nustep L5x5min  GAIT TRAINING: To normalize  gait pattern and improve safety.  THERAPEUTIC ACTIVITIES: To improve functional performance.  MANUAL THERAPY: To promote normalized muscle tension, improve joint mobility and/or for pain modulation   09/08/23 NEUROMUSCULAR RE-EDUCATION: To improve coordination, kinesthesia, posture, and proprioception.  KT tape for patellar tracking- 2 pieces starting from tibial tuberosity going to mid thigh Post KT tape:  Seated LAQ + VMO ball squeeze 2x10 B  Bridge + VMO ball squeeze 2x10  THERAPEUTIC EXERCISE: To improve strength, endurance, ROM, and flexibility.  Nustep L4x51min Post KT tape:  Seated HS curl GTB 2x10 B  R/L SLR + ER x 10  09/06/2023  THERAPEUTIC EXERCISE: To improve strength, endurance, and flexibility.  Demonstration, verbal and tactile cues throughout for technique.  NuStep - L4 x 6 min Supine HS stretch with strap 2 x 30 bil Supine ITB stretch with strap 2 x 30 bil Mod thomas hip flexor stretch with strap 2 x 30 bil Prone hip flexor & quad stretch with strap 2 x 30 bil Supine quad set on towel roll 10 x 5 bil  NEUROMUSCULAR RE-EDUCATION: To improve coordination and kinesthesia. B SAQ over 6 foam roll with ball squeeze between ankles 10 x 3   08/23/2023 - Eval  SELF CARE:  Reviewed eval findings and role of PT in addressing identified deficits as well as instruction in initial HEP (see below).    PATIENT EDUCATION:  Education details: HEP review and review of standing posture to avoid genu recurvatum in standing  Person educated: Patient Education method: Explanation, Demonstration, Verbal cues, and Tactile cues Education comprehension: verbalized understanding, returned demonstration, verbal cues required, tactile cues required, and needs further education  HOME EXERCISE PROGRAM: Access Code: [email or web address] URL:  [email or web address] Date: 09/08/2023 Prepared by: Charnel Giles  Exercises - Supine Hamstring Stretch with Strap  - 2-3 x daily - 7 x weekly - 3 reps - 30 sec hold - Supine Iliotibial Band Stretch with Strap  - 2-3 x daily - 7 x weekly - 3 reps - 30 sec hold - Hip Flexor Stretch with Strap on Table  - 2-3 x daily - 7 x weekly - 3 reps - 30 sec hold - Prone Hip Flexor & Quad Stretch with Strap  - 2-3 x daily - 7 x weekly - 3 reps - 30 sec hold - Supine Quad Set on Towel Roll  - 2 x daily - 7 x weekly - 2 sets - 10 reps - 5 sec hold - Seated Long Arc Quad with Hip Adduction  - 1 x daily - 3-4 x weekly - 2 sets - 10 reps - Seated Hamstring Curl with Anchored Resistance  - 1 x daily - 3-4 x weekly - 2 sets - 10 reps - Supine Bridge with Mini Swiss Ball Between Knees  - 1 x daily - 3-4 x weekly - 2 sets - 10 reps   ASSESSMENT:  CLINICAL IMPRESSION: Progressed with strengthening specifically for quads and VMO. Applied KT tape for patellar stabilization once again. Progressed with LE strengthening focusing on proximal muscles to improve distal support. Session limited d/t patient late arrival.    EVAL:  Julanne Schlueter is a 43 y.o. female who was referred to physical therapy for evaluation and treatment for B patellofemoral stress syndrome.  Patient recently completed 6 visits of PT at this clinic for low back pain from 04/08/2023 to 05/12/2023, however did not formally complete the episode as she no showed for her final scheduled visit.  She reports onset of B knee pain ~  3 months ago which she attributes potentially to altering her mobility related to her low back pain.  Pain is worse with prolonged sitting or standing.  Patient has deficits in B knee ROM, B proximal LE flexibility, B LE strength, and abnormal posture which are interfering with ADLs and are impacting quality of life.  On LEFS patient scored 27/80 demonstrating moderate functional limitation.  Caelin Vitta will  benefit from skilled PT to address above deficits to improve mobility and activity tolerance with decreased pain interference.  OBJECTIVE IMPAIRMENTS: Abnormal gait, decreased activity tolerance, decreased balance, decreased coordination, decreased endurance, decreased knowledge of condition, decreased mobility, difficulty walking, decreased ROM, decreased strength, increased fascial restrictions, impaired perceived functional ability, increased muscle spasms, impaired flexibility, improper body mechanics, postural dysfunction, and pain.   ACTIVITY LIMITATIONS: lifting, sitting, standing, squatting, sleeping, stairs, transfers, bed mobility, and locomotion level  PARTICIPATION LIMITATIONS: meal prep, cleaning, laundry, driving, shopping, community activity, and occupation  PERSONAL FACTORS: Fitness, Past/current experiences, Time since onset of injury/illness/exacerbation, and 3+ comorbidities: Arthritis, LBP, GERD, PreDiabetes, obesity, depression, fatigue, dyspnea are also affecting patient's functional outcome.   REHAB POTENTIAL: Good  CLINICAL DECISION MAKING: Evolving/moderate complexity  EVALUATION COMPLEXITY: Moderate   GOALS: Goals reviewed with patient? Yes  SHORT TERM GOALS: Target date: 09/20/2023  Patient will be independent with initial HEP. Baseline: Initial HEP provided on eval Goal status: IN PROGRESS - 09/06/23 - Pt returning for 1st visit since eval. Limited compliance with HEP (not able to complete strap stretches). All exercises reviewed today.  2.  Patient will report at least 25% improvement in B knee pain to improve QOL. Baseline: 7/10 Goal status: MET- 09/13/23 pt reports 70% improved pain  3.  Patient will demonstrate neutral B knee alignment in stance without genu recurvatum. Baseline:  Goal status: INITIAL   LONG TERM GOALS: Target date: 10/18/2023  Patient will be independent with advanced/ongoing HEP to improve outcomes and carryover.  Baseline:  Goal  status: INITIAL  2.  Patient will report at least 50-75% improvement in B knee pain to improve QOL. Baseline: 7/10 Goal status: INITIAL  3.  Patient will demonstrate improved B knee AROM to >/= 0-125 deg to allow for normal gait and stair mechanics. Baseline: Refer to above LE ROM table Goal status: INITIAL  4.  Patient will demonstrate improved B LE strength to >/= 4+/5 for improved stability and ease of mobility. Baseline: Refer to above LE MMT table Goal status: INITIAL  5.  Patient will be able to ambulate 600' with normal gait pattern without increased pain to access community.  Baseline:  Goal status: INITIAL  6. Patient will be able to ascend/descend stairs with 1 HR and reciprocal step pattern safely to access home and community.  Baseline:  Goal status: INITIAL  7.  Patient will report >/= 36/80 on LEFS (MCID = 9 pts) to demonstrate improved functional ability. Baseline: 27 / 80 = 33.8 % Goal status: INITIAL   PLAN:  PT FREQUENCY: 2x/week  PT DURATION: 8 weeks  PLANNED INTERVENTIONS: 97164- PT Re-evaluation, 97750- Physical Performance Testing, 97110-Therapeutic exercises, 97530- Therapeutic activity, [id number]- Neuromuscular re-education, 97535- Self Care, [id number]- Manual therapy, [phone]- Gait training, [phone]- Aquatic Therapy, [phone] (1-2 muscles), [id number] (3+ muscles)- Dry Needling, Patient/Family education, Balance training, Stair training, Taping, Joint mobilization, DME instructions, Cryotherapy, and Moist heat  PLAN FOR NEXT SESSION: progress proximal LE flexibility especially in quads; progress hip and knee strengthening with emphasis on quads especially VMO; update HEP accordingly   Mardi Cannady L  Abishai Viegas, PTA 09/13/2023, 4:23 PM

## 2023-09-16 ENCOUNTER — Ambulatory Visit

## 2023-09-16 DIAGNOSIS — M9914 Subluxation complex (vertebral) of sacral region: Secondary | ICD-10-CM | POA: Diagnosis not present

## 2023-09-16 DIAGNOSIS — M9913 Subluxation complex (vertebral) of lumbar region: Secondary | ICD-10-CM | POA: Diagnosis not present

## 2023-09-16 DIAGNOSIS — M25561 Pain in right knee: Secondary | ICD-10-CM | POA: Diagnosis not present

## 2023-09-16 DIAGNOSIS — R2689 Other abnormalities of gait and mobility: Secondary | ICD-10-CM

## 2023-09-16 DIAGNOSIS — G8929 Other chronic pain: Secondary | ICD-10-CM

## 2023-09-16 DIAGNOSIS — M25562 Pain in left knee: Secondary | ICD-10-CM | POA: Diagnosis not present

## 2023-09-16 DIAGNOSIS — M6281 Muscle weakness (generalized): Secondary | ICD-10-CM

## 2023-09-16 DIAGNOSIS — M9915 Subluxation complex (vertebral) of pelvic region: Secondary | ICD-10-CM | POA: Diagnosis not present

## 2023-09-16 NOTE — Therapy (Signed)
 OUTPATIENT PHYSICAL THERAPY TREATMENT   Patient Name: Jody Bryan MRN: [id number] DOB:15-Dec-1980, 43 y.o., female Today's Date: 09/16/2023   END OF SESSION:  PT End of Session - 09/16/23 1334     Visit Number 5    Date for PT Re-Evaluation 10/18/23    Authorization Type [facility] Medicaid    Authorization Time Period 08/23/23 - 10/21/23    Authorization - Visit Number 5    Authorization - Number of Visits 6    PT Start Time 1328   pt late   PT Stop Time 1407    PT Time Calculation (min) 39 min    Activity Tolerance Patient tolerated treatment well    Behavior During Therapy WFL for tasks assessed/performed              Past Medical History:  Diagnosis Date   Arthritis    Bilateral leg edema    Chicken pox    Depression    Drug use    Dyspnea    Low back pain    Past Surgical History:  Procedure Laterality Date   WISDOM TOOTH EXTRACTION  08/2019   Patient Active Problem List   Diagnosis Date Noted   Elevated blood pressure reading without diagnosis of hypertension 08/23/2023   Depression 08/23/2023   Snoring 02/01/2023   Hx gestational diabetes 10/28/2022   Suspected sleep apnea 10/28/2022   Abnormal metabolism 10/28/2022   Leukocytosis 06/29/2022   Urinary retention 05/28/2022   Allergic conjunctivitis of both eyes 05/28/2022   Urinary frequency 05/28/2022   Contraception, device intrauterine 05/18/2022   Vitamin B12 deficiency 05/18/2022   Inguinal pain 08/12/2021   Class 3 severe obesity with serious comorbidity and body mass index (BMI) of 40.0 to 44.9 in adult 07/11/2021   De Quervain's tenosynovitis, bilateral 01/17/2021   Renal cyst 12/22/2019   Bulging lumbar disc 12/22/2019   Preventative health care 08/09/2017   Vitamin D  deficiency 04/01/2017   Prediabetes 04/01/2017   Other fatigue 03/17/2017   Low back pain with radiation 07/23/2014   Right wrist pain 09/05/2012   Meralgia paraesthetica 05/25/2012    PCP: Antonio Cyndee Jamee JONELLE,  DO   REFERRING PROVIDER: Liam Lerner, MD   REFERRING DIAG: M22.2X1,M22.2X2 (ICD-10-CM) - Bilateral patellofemoral stress syndrome   THERAPY DIAG:  Chronic pain of both knees  Muscle weakness (generalized)  Other abnormalities of gait and mobility  RATIONALE FOR EVALUATION AND TREATMENT: Rehabilitation  ONSET DATE: ~3 months  NEXT MD VISIT: To be scheduled pending outcome of PT   SUBJECTIVE:  SUBJECTIVE STATEMENT: Pt reports doing better getting in, out of bed still going downstairs also gives pain.  EVAL:  Pt reports she just woke up and her knees were hurting potentially from altering her mobility due to her back pain. Pain is constant, even when sleeping. Initially L knee was more painful, but now R knee is more painful and swollen.  Sometimes her knees feel like they wan to collapse when she is walking.  PAIN: Are you having pain? Yes: NPRS scale: 0/10  Pain location: R anterior knee Pain description: constant dull ache; occasional sharp, shooting pain  Aggravating factors: prolonged sitting or standing, squatting  Relieving factors: walking   PERTINENT HISTORY:  Arthritis, LBP, GERD, PreDiabetes, obesity, depression, fatigue, dyspnea  PRECAUTIONS: None  RED FLAGS: None  WEIGHT BEARING RESTRICTIONS: No  FALLS:  Has patient fallen in last 6 months? No  LIVING ENVIRONMENT:  Lives with: lives with their family Lives in: House/apartment Stairs: Yes: External: 2-3 steps; on left going up Has following equipment at home: None  OCCUPATION: [employer], will be switching to working for [employer] from home as of July  PLOF: Independent and Leisure: Has young children, walking 20-30 min/day   PATIENT GOALS: To be able to squat again and walk w/o pain. Also be able  to sit w/o pain.   OBJECTIVE: (objective measures completed at initial evaluation unless otherwise dated)  DIAGNOSTIC FINDINGS:  05/27/23 - DG knee 1-2 views right and left IMPRESSION: Minimal medial and patellofemoral compartment osteoarthritis.    PATIENT SURVEYS:  LEFS: 27 / 80 = 33.8 %, Moderate functional limitation  COGNITION: Overall cognitive status: Within functional limits for tasks assessed    SENSATION: WFL Tingling in back of the knees at nighttime, R>L  EDEMA:  Circumferential:    Right eval Left eval  At joint line 50.0 cm 46.5 cm  6 cm above 53.0 cm 51.5 cm  6 cm below 42.0 cm 41.0 cm   POSTURE:  anterior pelvic tilt and L>R genu recurvatum  PALPATION: Mildly limited patellar mobility R>L  MUSCLE LENGTH: Hamstrings: mod tight R, mild tight L ITB: mild tight B Piriformis: mod tight B Hip flexors: mild tight B Quads: mod tight B Heelcord: mod tight R, mild tight L  LOWER EXTREMITY ROM:  Active ROM Right eval Left eval R 09/16/23 L 09/16/23  Knee flexion 120 125 127 130  Knee extension 14 4 4  LAQ  -3   Passive ROM Right eval Left eval  Knee flexion 122 134  Knee extension 0 -8  (Blank rows = not tested)  LOWER EXTREMITY MMT:  MMT Right eval Left eval  Hip flexion 4+ 4+  Hip extension 4 4  Hip abduction 4+ 4  Hip adduction 4- 4  Hip internal rotation 4+ 4+  Hip external rotation 3+ p! 4-  Knee flexion 4 4+  Knee extension 4- p! 4+  Ankle dorsiflexion 5 5  Ankle plantarflexion    Ankle inversion    Ankle eversion     (Blank rows = not tested)  LOWER EXTREMITY SPECIAL TESTS:  Knee special tests: Lateral pull sign: positive B Patellofemoral grind test: positive on R   TODAY'S TREATMENT:  09/16/23 NEUROMUSCULAR RE-EDUCATION: To improve coordination, kinesthesia, posture, and proprioception.  KT tape chondromalacia pattern for patella Lateral band walk GTB at knees 4x49ft  Monster walk GTB at knees 4x17ft Mini squat with VMO ball  squeeze x 10  THERAPEUTIC EXERCISE: To improve strength, endurance, ROM, and flexibility.  Nustep L6x5min  Knee AROM measured Supine BLE SLR 2lb 2x10 S/L hip abduction 2x10 BLE 2lb   09/13/23 NEUROMUSCULAR RE-EDUCATION: To improve coordination, kinesthesia, posture, and proprioception.  KT tape chondromalacia pattern for patella Lateral band walk GTB at knees 2x51ft  Monster walk GTB at knees 2x9ft Sit to stand with VMO ball squeeze x 10 Seated R LAQ + VMO ball squeeze 2x10  THERAPEUTIC EXERCISE: To improve strength, endurance, ROM, and flexibility.  Nustep L5x5min  GAIT TRAINING: To normalize gait pattern and improve safety.  THERAPEUTIC ACTIVITIES: To improve functional performance.  MANUAL THERAPY: To promote normalized muscle tension, improve joint mobility and/or for pain modulation   09/08/23 NEUROMUSCULAR RE-EDUCATION: To improve coordination, kinesthesia, posture, and proprioception.  KT tape for patellar tracking- 2 pieces starting from tibial tuberosity going to mid thigh Post KT tape:  Seated LAQ + VMO ball squeeze 2x10 B  Bridge + VMO ball squeeze 2x10  THERAPEUTIC EXERCISE: To improve strength, endurance, ROM, and flexibility.  Nustep L4x60min Post KT tape:  Seated HS curl GTB 2x10 B  R/L SLR + ER x 10  09/06/2023  THERAPEUTIC EXERCISE: To improve strength, endurance, and flexibility.  Demonstration, verbal and tactile cues throughout for technique.  NuStep - L4 x 6 min Supine HS stretch with strap 2 x 30 bil Supine ITB stretch with strap 2 x 30 bil Mod thomas hip flexor stretch with strap 2 x 30 bil Prone hip flexor & quad stretch with strap 2 x 30 bil Supine quad set on towel roll 10 x 5 bil  NEUROMUSCULAR RE-EDUCATION: To improve coordination and kinesthesia. B SAQ over 6 foam roll with ball squeeze between ankles 10 x 3   08/23/2023 - Eval  SELF CARE:  Reviewed eval findings and role of PT in addressing identified deficits as well as instruction in  initial HEP (see below).    PATIENT EDUCATION:  Education details: HEP review and review of standing posture to avoid genu recurvatum in standing  Person educated: Patient Education method: Explanation, Demonstration, Verbal cues, and Tactile cues Education comprehension: verbalized understanding, returned demonstration, verbal cues required, tactile cues required, and needs further education  HOME EXERCISE PROGRAM: Access Code: [email or web address] URL: [email or web address] Date: 09/08/2023 Prepared by: Ratasha Fabre  Exercises - Supine Hamstring Stretch with Strap  - 2-3 x daily - 7 x weekly - 3 reps - 30 sec hold - Supine Iliotibial Band Stretch with Strap  - 2-3 x daily - 7 x weekly - 3 reps - 30 sec hold - Hip Flexor Stretch with Strap on Table  - 2-3 x daily - 7 x weekly - 3 reps - 30 sec hold - Prone Hip Flexor & Quad Stretch with Strap  - 2-3 x daily - 7 x weekly - 3 reps - 30 sec hold - Supine Quad Set on Towel Roll  - 2 x daily - 7 x weekly - 2 sets - 10 reps - 5 sec hold - Seated Long Arc Quad with Hip Adduction  - 1 x daily - 3-4 x weekly - 2 sets - 10 reps - Seated Hamstring Curl with Anchored Resistance  - 1 x daily - 3-4 x weekly - 2 sets - 10 reps - Supine Bridge with Mini Swiss Ball Between Knees  - 1 x daily - 3-4 x weekly - 2 sets - 10 reps   ASSESSMENT:  CLINICAL IMPRESSION: Pt able to complete interventions today. We are advancing dynamic strength and stability for BLE. Able to attempt mini squats today w/o  knee pain until the final rep. Pt is showing increased knee ROM B, however still lacking 4 deg in LAQ on R. Pt continues to show progress from KT tape, she has ordered and it will be here today.   EVAL:  Jody Bryan is a 43 y.o. female who was referred to physical therapy for evaluation and treatment for B patellofemoral stress syndrome.  Patient recently completed 6 visits of PT at this clinic for low back pain from 04/08/2023 to 05/12/2023, however did  not formally complete the episode as she no showed for her final scheduled visit.  She reports onset of B knee pain ~3 months ago which she attributes potentially to altering her mobility related to her low back pain.  Pain is worse with prolonged sitting or standing.  Patient has deficits in B knee ROM, B proximal LE flexibility, B LE strength, and abnormal posture which are interfering with ADLs and are impacting quality of life.  On LEFS patient scored 27/80 demonstrating moderate functional limitation.  Jody Bryan will benefit from skilled PT to address above deficits to improve mobility and activity tolerance with decreased pain interference.  OBJECTIVE IMPAIRMENTS: Abnormal gait, decreased activity tolerance, decreased balance, decreased coordination, decreased endurance, decreased knowledge of condition, decreased mobility, difficulty walking, decreased ROM, decreased strength, increased fascial restrictions, impaired perceived functional ability, increased muscle spasms, impaired flexibility, improper body mechanics, postural dysfunction, and pain.   ACTIVITY LIMITATIONS: lifting, sitting, standing, squatting, sleeping, stairs, transfers, bed mobility, and locomotion level  PARTICIPATION LIMITATIONS: meal prep, cleaning, laundry, driving, shopping, community activity, and occupation  PERSONAL FACTORS: Fitness, Past/current experiences, Time since onset of injury/illness/exacerbation, and 3+ comorbidities: Arthritis, LBP, GERD, PreDiabetes, obesity, depression, fatigue, dyspnea are also affecting patient's functional outcome.   REHAB POTENTIAL: Good  CLINICAL DECISION MAKING: Evolving/moderate complexity  EVALUATION COMPLEXITY: Moderate   GOALS: Goals reviewed with patient? Yes  SHORT TERM GOALS: Target date: 09/20/2023  Patient will be independent with initial HEP. Baseline: Initial HEP provided on eval Goal status: IN PROGRESS - 09/06/23 - Pt returning for 1st visit since eval.  Limited compliance with HEP (not able to complete strap stretches). All exercises reviewed today.  2.  Patient will report at least 25% improvement in B knee pain to improve QOL. Baseline: 7/10 Goal status: MET- 09/13/23 pt reports 70% improved pain  3.  Patient will demonstrate neutral B knee alignment in stance without genu recurvatum. Baseline:  Goal status: PARTIALLY MET- 09/16/23 tends to lock L knee more noticeably    LONG TERM GOALS: Target date: 10/18/2023  Patient will be independent with advanced/ongoing HEP to improve outcomes and carryover.  Baseline:  Goal status: IN PROGRESS  2.  Patient will report at least 50-75% improvement in B knee pain to improve QOL. Baseline: 7/10 Goal status: IN PROGRESS  3.  Patient will demonstrate improved B knee AROM to >/= 0-125 deg to allow for normal gait and stair mechanics. Baseline: Refer to above LE ROM table Goal status: IN PROGRESS- 09/16/23 please refer to AROM chart, lacking 4 deg LAQ RLE  4.  Patient will demonstrate improved B LE strength to >/= 4+/5 for improved stability and ease of mobility. Baseline: Refer to above LE MMT table Goal status: IN PROGRESS  5.  Patient will be able to ambulate 600' with normal gait pattern without increased pain to access community.  Baseline:  Goal status: IN PROGRESS  6. Patient will be able to ascend/descend stairs with 1 HR and reciprocal step pattern safely to  access home and community.  Baseline:  Goal status: IN PROGRESS  7.  Patient will report >/= 36/80 on LEFS (MCID = 9 pts) to demonstrate improved functional ability. Baseline: 27 / 80 = 33.8 % Goal status: IN PROGRESS   PLAN:  PT FREQUENCY: 2x/week  PT DURATION: 8 weeks  PLANNED INTERVENTIONS: 97164- PT Re-evaluation, 97750- Physical Performance Testing, 97110-Therapeutic exercises, 97530- Therapeutic activity, 97112- Neuromuscular re-education, 97535- Self Care, [id number]- Manual therapy, [phone]- Gait training, [phone]- Aquatic  Therapy, [phone] (1-2 muscles), [id number] (3+ muscles)- Dry Needling, Patient/Family education, Balance training, Stair training, Taping, Joint mobilization, DME instructions, Cryotherapy, and Moist heat  PLAN FOR NEXT SESSION: progress proximal LE flexibility especially in quads; progress hip and knee strengthening with emphasis on quads especially VMO; update HEP accordingly   Jody Bryan, PTA 09/16/2023, 2:08 PM

## 2023-09-20 ENCOUNTER — Ambulatory Visit: Admitting: Rehabilitation

## 2023-09-20 ENCOUNTER — Ambulatory Visit (INDEPENDENT_AMBULATORY_CARE_PROVIDER_SITE_OTHER): Admitting: Internal Medicine

## 2023-09-23 ENCOUNTER — Encounter

## 2023-09-24 ENCOUNTER — Ambulatory Visit: Admitting: Rehabilitation

## 2023-09-24 DIAGNOSIS — M6281 Muscle weakness (generalized): Secondary | ICD-10-CM | POA: Diagnosis not present

## 2023-09-24 DIAGNOSIS — M25561 Pain in right knee: Secondary | ICD-10-CM | POA: Diagnosis not present

## 2023-09-24 DIAGNOSIS — G8929 Other chronic pain: Secondary | ICD-10-CM | POA: Diagnosis not present

## 2023-09-24 DIAGNOSIS — R2689 Other abnormalities of gait and mobility: Secondary | ICD-10-CM | POA: Diagnosis not present

## 2023-09-24 DIAGNOSIS — M25562 Pain in left knee: Secondary | ICD-10-CM | POA: Diagnosis not present

## 2023-09-24 NOTE — Therapy (Addendum)
 OUTPATIENT PHYSICAL THERAPY TREATMENT    Patient Name: Jody Bryan MRN: [id number] DOB:12/03/1980, 43 y.o., female Today's Date: 09/24/2023   END OF SESSION:  PT End of Session - 09/24/23 0800     Visit Number 6    Date for PT Re-Evaluation 10/18/23    Authorization Type [facility] Medicaid    Authorization Time Period 08/23/23 - 10/21/23    Authorization - Number of Visits 6    PT Start Time 0801    PT Stop Time 0840    PT Time Calculation (min) 39 min    Activity Tolerance Patient tolerated treatment well    Behavior During Therapy WFL for tasks assessed/performed              Past Medical History:  Diagnosis Date   Arthritis    Bilateral leg edema    Chicken pox    Depression    Drug use    Dyspnea    Low back pain    Past Surgical History:  Procedure Laterality Date   WISDOM TOOTH EXTRACTION  08/2019   Patient Active Problem List   Diagnosis Date Noted   Elevated blood pressure reading without diagnosis of hypertension 08/23/2023   Depression 08/23/2023   Snoring 02/01/2023   Hx gestational diabetes 10/28/2022   Suspected sleep apnea 10/28/2022   Abnormal metabolism 10/28/2022   Leukocytosis 06/29/2022   Urinary retention 05/28/2022   Allergic conjunctivitis of both eyes 05/28/2022   Urinary frequency 05/28/2022   Contraception, device intrauterine 05/18/2022   Vitamin B12 deficiency 05/18/2022   Inguinal pain 08/12/2021   Class 3 severe obesity with serious comorbidity and body mass index (BMI) of 40.0 to 44.9 in adult 07/11/2021   De Quervain's tenosynovitis, bilateral 01/17/2021   Renal cyst 12/22/2019   Bulging lumbar disc 12/22/2019   Preventative health care 08/09/2017   Vitamin D  deficiency 04/01/2017   Prediabetes 04/01/2017   Other fatigue 03/17/2017   Low back pain with radiation 07/23/2014   Right wrist pain 09/05/2012   Meralgia paraesthetica 05/25/2012    PCP: Antonio Cyndee Jamee JONELLE, DO   REFERRING PROVIDER: Liam Lerner,  MD   REFERRING DIAG: M22.2X1,M22.2X2 (ICD-10-CM) - Bilateral patellofemoral stress syndrome   THERAPY DIAG:  Chronic pain of both knees  Muscle weakness (generalized)  Other abnormalities of gait and mobility  RATIONALE FOR EVALUATION AND TREATMENT: Rehabilitation  ONSET DATE: ~3 months  NEXT MD VISIT: To be scheduled pending outcome of PT   SUBJECTIVE:  SUBJECTIVE STATEMENT: Patient has been seen x 6 PT visits for patellofemoral pain RLE.   She states she is doing her HEP daily now.  States can really tell a difference and is getting better.  Still c/o some R knee pain with prolonged ambulation for exercise.  Her goal is to return to walking several days/week for exercise without pain.  States feels close, but not there yet.   Pain is 0/10 at rest and with short distance gait, but still has 2-3/10 pain with long walks for exercise  EVAL:  Pt reports she just woke up and her knees were hurting potentially from altering her mobility due to her back pain. Pain is constant, even when sleeping. Initially L knee was more painful, but now R knee is more painful and swollen.  Sometimes her knees feel like they wan to collapse when she is walking.  PAIN: Are you having pain? Yes: NPRS scale: 0/10  Pain location: R anterior knee Pain description: constant dull ache; occasional sharp, shooting pain  Aggravating factors: prolonged sitting or standing, squatting  Relieving factors: walking   PERTINENT HISTORY:  Arthritis, LBP, GERD, PreDiabetes, obesity, depression, fatigue, dyspnea  PRECAUTIONS: None  RED FLAGS: None  WEIGHT BEARING RESTRICTIONS: No  FALLS:  Has patient fallen in last 6 months? No  LIVING ENVIRONMENT:  Lives with: lives with their family Lives in:  House/apartment Stairs: Yes: External: 2-3 steps; on left going up Has following equipment at home: None  OCCUPATION: [employer], will be switching to working for [employer] from home as of July  PLOF: Independent and Leisure: Has young children, walking 20-30 min/day   PATIENT GOALS: To be able to squat again and walk w/o pain. Also be able to sit w/o pain.   OBJECTIVE: (objective measures completed at initial evaluation unless otherwise dated)  DIAGNOSTIC FINDINGS:  05/27/23 - DG knee 1-2 views right and left IMPRESSION: Minimal medial and patellofemoral compartment osteoarthritis.    PATIENT SURVEYS:  LEFS: 27 / 80 = 33.8 %, Moderate functional limitation  COGNITION: Overall cognitive status: Within functional limits for tasks assessed    SENSATION: WFL Tingling in back of the knees at nighttime, R>L  EDEMA:  Circumferential:    Right eval Left eval  At joint line 50.0 cm 46.5 cm  6 cm above 53.0 cm 51.5 cm  6 cm below 42.0 cm 41.0 cm   POSTURE:  anterior pelvic tilt and L>R genu recurvatum  PALPATION: Mildly limited patellar mobility R>L  MUSCLE LENGTH: Hamstrings: mod tight R, mild tight L ITB: mild tight B Piriformis: mod tight B Hip flexors: mild tight B Quads: mod tight B Heelcord: mod tight R, mild tight L  LOWER EXTREMITY ROM:  Active ROM Right eval Left eval R 09/16/23 L 09/16/23 R 09/24/23 L 09/24/23  Knee flexion 120 125 127 130 130 130  Knee extension 14 4 4  LAQ  -3 0 0   Passive ROM Right eval Left eval  Knee flexion 122 134  Knee extension 0 -8  (Blank rows = not tested)  LOWER EXTREMITY MMT:  MMT Right eval Left eval RLE LLE  Hip flexion 4+ 4+ 5 5  Hip extension 4 4 4  4+  Hip abduction 4+ 4 4 4+  Hip adduction 4- 4 4 4   Hip internal rotation 4+ 4+ 4+ 4+  Hip external rotation 3+ p! 4- 4 4  Knee flexion 4 4+ 4+ 4+  Knee extension 4- p! 4+ 5 5  Ankle dorsiflexion 5  5 5 5   Ankle plantarflexion      Ankle inversion      Ankle  eversion       (Blank rows = not tested)  LOWER EXTREMITY SPECIAL TESTS:  Knee special tests: Lateral pull sign: positive B Patellofemoral grind test: positive on R   TODAY'S TREATMENT:  09/24/23 THERAPEUTIC EXERCISE: To improve strength.  Demonstration, verbal and tactile cues throughout for technique. NuStep L4 x 6'  NEUROMUSCULAR RE-EDUCATION: To improve balance, kinesthesia, and proprioception Wall squat ball btw knees x 3/10 Monster walking GTB 4 x 20' Sidestepping GTB 4 x 20'  THERAPEUTIC ACTIVITIES: To improve functional performance.  Demonstration, verbal and tactile cues throughout for technique. SL hip abd x 3/10 RLE  Prone hip ext x 3/10 RLE Prone knee flexion 2# x 3/10 RLE  09/16/23 NEUROMUSCULAR RE-EDUCATION: To improve coordination, kinesthesia, posture, and proprioception.  KT tape chondromalacia pattern for patella Lateral band walk GTB at knees 4x15ft  Monster walk GTB at knees 4x13ft Mini squat with VMO ball squeeze x 10  THERAPEUTIC EXERCISE: To improve strength, endurance, ROM, and flexibility.  Nustep L6x82min  Knee AROM measured Supine BLE SLR 2lb 2x10 S/L hip abduction 2x10 BLE 2lb   09/13/23 NEUROMUSCULAR RE-EDUCATION: To improve coordination, kinesthesia, posture, and proprioception.  KT tape chondromalacia pattern for patella Lateral band walk GTB at knees 2x89ft  Monster walk GTB at knees 2x65ft Sit to stand with VMO ball squeeze x 10 Seated R LAQ + VMO ball squeeze 2x10  THERAPEUTIC EXERCISE: To improve strength, endurance, ROM, and flexibility.  Nustep L5x5min  GAIT TRAINING: To normalize gait pattern and improve safety.  THERAPEUTIC ACTIVITIES: To improve functional performance.  MANUAL THERAPY: To promote normalized muscle tension, improve joint mobility and/or for pain modulation   09/08/23 NEUROMUSCULAR RE-EDUCATION: To improve coordination, kinesthesia, posture, and proprioception.  KT tape for patellar tracking- 2 pieces starting from  tibial tuberosity going to mid thigh Post KT tape:  Seated LAQ + VMO ball squeeze 2x10 B  Bridge + VMO ball squeeze 2x10  THERAPEUTIC EXERCISE: To improve strength, endurance, ROM, and flexibility.  Nustep L4x22min Post KT tape:  Seated HS curl GTB 2x10 B  R/L SLR + ER x 10  09/06/2023  THERAPEUTIC EXERCISE: To improve strength, endurance, and flexibility.  Demonstration, verbal and tactile cues throughout for technique.  NuStep - L4 x 6 min Supine HS stretch with strap 2 x 30 bil Supine ITB stretch with strap 2 x 30 bil Mod thomas hip flexor stretch with strap 2 x 30 bil Prone hip flexor & quad stretch with strap 2 x 30 bil Supine quad set on towel roll 10 x 5 bil  NEUROMUSCULAR RE-EDUCATION: To improve coordination and kinesthesia. B SAQ over 6 foam roll with ball squeeze between ankles 10 x 3   08/23/2023 - Eval  SELF CARE:  Reviewed eval findings and role of PT in addressing identified deficits as well as instruction in initial HEP (see below).    PATIENT EDUCATION:  Education details: HEP review and review of standing posture to avoid genu recurvatum in standing  Person educated: Patient Education method: Explanation, Demonstration, Verbal cues, and Tactile cues Education comprehension: verbalized understanding, returned demonstration, verbal cues required, tactile cues required, and needs further education  HOME EXERCISE PROGRAM: Access Code: [email or web address] URL: [email or web address] Date: 09/08/2023 Prepared by: Braylin Clark  Exercises - Supine Hamstring Stretch with Strap  - 2-3 x daily - 7 x weekly - 3 reps - 30 sec hold - Supine  Iliotibial Band Stretch with Strap  - 2-3 x daily - 7 x weekly - 3 reps - 30 sec hold - Hip Flexor Stretch with Strap on Table  - 2-3 x daily - 7 x weekly - 3 reps - 30 sec hold - Prone Hip Flexor & Quad Stretch with Strap  - 2-3 x daily - 7 x weekly - 3 reps - 30 sec hold - Supine Quad Set on Towel Roll  - 2 x daily - 7 x  weekly - 2 sets - 10 reps - 5 sec hold - Seated Long Arc Quad with Hip Adduction  - 1 x daily - 3-4 x weekly - 2 sets - 10 reps - Seated Hamstring Curl with Anchored Resistance  - 1 x daily - 3-4 x weekly - 2 sets - 10 reps - Supine Bridge with Mini Swiss Ball Between Knees  - 1 x daily - 3-4 x weekly - 2 sets - 10 reps   ASSESSMENT:  CLINICAL IMPRESSION: Patient has been seen x 6 PT visits for R patellofemoral pain.   She has made good progress to goals, but has not yet attained all of the goals that we have set for her.  She is feeling much better, but still has residual pain/discomfort in the R knee with prolonged ambulation for exercise.   She is very pleased with her progress to date, but her main goal is to walk for exercise as she did prior to this injury.  R knee strength and flexibility have improved, but there is still weakness and alignment problems present in WB positions.   WE feel she can met her goals in 3 weeks/3 more visits, and will request to continue 1w3 for R hip strengthening, R knee proprioception/postural deficits.  PT remains necessary to resolve these 2 issues and once resolved her pain should be eliminated.  Continue PT 1w3.  EVAL:  Jody Bryan is a 43 y.o. female who was referred to physical therapy for evaluation and treatment for B patellofemoral stress syndrome.  Patient recently completed 6 visits of PT at this clinic for low back pain from 04/08/2023 to 05/12/2023, however did not formally complete the episode as she no showed for her final scheduled visit.  She reports onset of B knee pain ~3 months ago which she attributes potentially to altering her mobility related to her low back pain.  Pain is worse with prolonged sitting or standing.  Patient has deficits in B knee ROM, B proximal LE flexibility, B LE strength, and abnormal posture which are interfering with ADLs and are impacting quality of life.  On LEFS patient scored 27/80 demonstrating moderate functional  limitation.  Jahmia Vitta will benefit from skilled PT to address above deficits to improve mobility and activity tolerance with decreased pain interference.  OBJECTIVE IMPAIRMENTS: Abnormal gait, decreased activity tolerance, decreased balance, decreased coordination, decreased endurance, decreased knowledge of condition, decreased mobility, difficulty walking, decreased ROM, decreased strength, increased fascial restrictions, impaired perceived functional ability, increased muscle spasms, impaired flexibility, improper body mechanics, postural dysfunction, and pain.   ACTIVITY LIMITATIONS: lifting, sitting, standing, squatting, sleeping, stairs, transfers, bed mobility, and locomotion level  PARTICIPATION LIMITATIONS: meal prep, cleaning, laundry, driving, shopping, community activity, and occupation  PERSONAL FACTORS: Fitness, Past/current experiences, Time since onset of injury/illness/exacerbation, and 3+ comorbidities: Arthritis, LBP, GERD, PreDiabetes, obesity, depression, fatigue, dyspnea are also affecting patient's functional outcome.   REHAB POTENTIAL: Good  CLINICAL DECISION MAKING: Evolving/moderate complexity  EVALUATION COMPLEXITY: Moderate   GOALS:  Goals reviewed with patient? Yes  SHORT TERM GOALS: Target date: 09/20/2023  Patient will be independent with initial HEP. Baseline: Initial HEP provided on eval Goal status: MET - 09/06/23 - Pt returning for 1st visit since eval. Limited compliance with HEP (not able to complete strap stretches). All exercises reviewed today.  2.  Patient will report at least 25% improvement in B knee pain to improve QOL. Baseline: 7/10 Goal status: MET- 09/13/23 pt reports 70% improved pain  3.  Patient will demonstrate neutral B knee alignment in stance without genu recurvatum. Baseline:  Goal status: MET-09/24/23 not hyperextending with gait  09/16/23 tends to lock L knee more noticeably    LONG TERM GOALS: Target date:  10/18/2023  Patient will be independent with advanced/ongoing HEP to improve outcomes and carryover.  Baseline:  Goal status: MET  2.  Patient will report at least 50-75% improvement in B knee pain to improve QOL. Baseline: 0/10 Goal status: MET  3.  Patient will demonstrate improved B knee AROM to >/= 0-125 deg to allow for normal gait and stair mechanics. Baseline: Refer to above LE ROM table Goal status: MET  09/24/23 0 degrees extension  09/16/23 please refer to AROM chart, lacking 4 deg LAQ RLE  4.  Patient will demonstrate improved B LE strength to >/= 4+/5 for improved stability and ease of mobility. Baseline: Refer to above LE MMT table Goal status: IN PROGRESS--still needs to improve on R hip strength  5.  Patient will be able to ambulate 600' with normal gait pattern without increased pain to access community.  Baseline:  Goal status: IN PROGRESS--09/24/23 still tends to hyperextend and externally rotate BLE R>L with ambulation causing pain with prolonged gait  6. Patient will be able to ascend/descend stairs with 1 HR and reciprocal step pattern safely to access home and community.  Baseline:  Goal status: IN PROGRESS 09/24/23 = ambulates 1HR reciprocally, but has poor knee positioning on descent  7.  Patient will report >/= 36/80 on LEFS (MCID = 9 pts) to demonstrate improved functional ability. Baseline: 27 / 80 = 33.8 % Goal status: MET;  09/24/23 54/80 = 67.5%   PLAN:  PT FREQUENCY: 2x/week  PT DURATION: 8 weeks  PLANNED INTERVENTIONS: 97164- PT Re-evaluation, 97750- Physical Performance Testing, 97110-Therapeutic exercises, 97530- Therapeutic activity, 97112- Neuromuscular re-education, 97535- Self Care, [id number]- Manual therapy, [phone]- Gait training, [phone]- Aquatic Therapy, [phone] (1-2 muscles), [id number] (3+ muscles)- Dry Needling, Patient/Family education, Balance training, Stair training, Taping, Joint mobilization, DME instructions, Cryotherapy, and Moist heat  PLAN FOR  NEXT SESSION: Continue to increase strength/proprioception in B knees in closed chain; step ups/downs; foam/Bosu balance   Infant Doane, PT 09/24/2023, 12:43 PM

## 2023-09-27 ENCOUNTER — Encounter: Payer: Self-pay | Admitting: Physical Therapy

## 2023-09-27 ENCOUNTER — Ambulatory Visit: Admitting: Physical Therapy

## 2023-09-27 DIAGNOSIS — G8929 Other chronic pain: Secondary | ICD-10-CM

## 2023-09-27 DIAGNOSIS — R2689 Other abnormalities of gait and mobility: Secondary | ICD-10-CM | POA: Diagnosis not present

## 2023-09-27 DIAGNOSIS — M6281 Muscle weakness (generalized): Secondary | ICD-10-CM | POA: Diagnosis not present

## 2023-09-27 DIAGNOSIS — M25561 Pain in right knee: Secondary | ICD-10-CM | POA: Diagnosis not present

## 2023-09-27 DIAGNOSIS — M25562 Pain in left knee: Secondary | ICD-10-CM | POA: Diagnosis not present

## 2023-09-27 NOTE — Therapy (Addendum)
 OUTPATIENT PHYSICAL THERAPY TREATMENT / DISCHARGE SUMMARY   Patient Name: Jody Bryan MRN: [id number] DOB:November 25, 1980, 43 y.o., female Today's Date: 09/27/2023   END OF SESSION:  PT End of Session - 09/27/23 0820     Visit Number 7    Date for PT Re-Evaluation 10/18/23    Authorization Type [facility] Medicaid    Authorization Time Period 09/27/23 - 10/26/23    Authorization - Visit Number 1    Authorization - Number of Visits 3    PT Start Time 0820   pt arrived late today.   PT Stop Time 0843    PT Time Calculation (min) 23 min    Activity Tolerance Patient tolerated treatment well    Behavior During Therapy WFL for tasks assessed/performed              Past Medical History:  Diagnosis Date   Arthritis    Bilateral leg edema    Chicken pox    Depression    Drug use    Dyspnea    Low back pain    Past Surgical History:  Procedure Laterality Date   WISDOM TOOTH EXTRACTION  08/2019   Patient Active Problem List   Diagnosis Date Noted   Elevated blood pressure reading without diagnosis of hypertension 08/23/2023   Depression 08/23/2023   Snoring 02/01/2023   Hx gestational diabetes 10/28/2022   Suspected sleep apnea 10/28/2022   Abnormal metabolism 10/28/2022   Leukocytosis 06/29/2022   Urinary retention 05/28/2022   Allergic conjunctivitis of both eyes 05/28/2022   Urinary frequency 05/28/2022   Contraception, device intrauterine 05/18/2022   Vitamin B12 deficiency 05/18/2022   Inguinal pain 08/12/2021   Class 3 severe obesity with serious comorbidity and body mass index (BMI) of 40.0 to 44.9 in adult 07/11/2021   De Quervain's tenosynovitis, bilateral 01/17/2021   Renal cyst 12/22/2019   Bulging lumbar disc 12/22/2019   Preventative health care 08/09/2017   Vitamin D  deficiency 04/01/2017   Prediabetes 04/01/2017   Other fatigue 03/17/2017   Low back pain with radiation 07/23/2014   Right wrist pain 09/05/2012   Meralgia paraesthetica  05/25/2012    PCP: Antonio Cyndee Jamee JONELLE, DO   REFERRING PROVIDER: Liam Lerner, MD   REFERRING DIAG: M22.2X1,M22.2X2 (ICD-10-CM) - Bilateral patellofemoral stress syndrome   THERAPY DIAG:  Chronic pain of both knees  Muscle weakness (generalized)  Other abnormalities of gait and mobility  RATIONALE FOR EVALUATION AND TREATMENT: Rehabilitation  ONSET DATE: ~3 months  NEXT MD VISIT: To be scheduled pending outcome of PT   SUBJECTIVE:  SUBJECTIVE STATEMENT: Pt has been being more mindful about trying not to lock her knees out when going to stand. She reports she went up the stairs today with no pain. Going down still is a bit painful but, she reports it felt good.   EVAL:  Pt reports she just woke up and her knees were hurting potentially from altering her mobility due to her back pain. Pain is constant, even when sleeping. Initially L knee was more painful, but now R knee is more painful and swollen.  Sometimes her knees feel like they wan to collapse when she is walking.  PAIN: Are you having pain? Yes: NPRS scale: 4/10  Pain location: R anterior knee Pain description: constant dull ache; occasional sharp, shooting pain  Aggravating factors: prolonged sitting or standing, squatting  Relieving factors: walking   PERTINENT HISTORY:  Arthritis, LBP, GERD, PreDiabetes, obesity, depression, fatigue, dyspnea  PRECAUTIONS: None  RED FLAGS: None  WEIGHT BEARING RESTRICTIONS: No  FALLS:  Has patient fallen in last 6 months? No  LIVING ENVIRONMENT:  Lives with: lives with their family Lives in: House/apartment Stairs: Yes: External: 2-3 steps; on left going up Has following equipment at home: None  OCCUPATION: [employer], will be switching to working for [employer] from  home as of July  PLOF: Independent and Leisure: Has young children, walking 20-30 min/day   PATIENT GOALS: To be able to squat again and walk w/o pain. Also be able to sit w/o pain.   OBJECTIVE: (objective measures completed at initial evaluation unless otherwise dated)  DIAGNOSTIC FINDINGS:  05/27/23 - DG knee 1-2 views right and left IMPRESSION: Minimal medial and patellofemoral compartment osteoarthritis.    PATIENT SURVEYS:  LEFS: 27 / 80 = 33.8 %, Moderate functional limitation  COGNITION: Overall cognitive status: Within functional limits for tasks assessed    SENSATION: WFL Tingling in back of the knees at nighttime, R>L  EDEMA:  Circumferential:    Right eval Left eval  At joint line 50.0 cm 46.5 cm  6 cm above 53.0 cm 51.5 cm  6 cm below 42.0 cm 41.0 cm   POSTURE:  anterior pelvic tilt and L>R genu recurvatum  PALPATION: Mildly limited patellar mobility R>L  MUSCLE LENGTH: Hamstrings: mod tight R, mild tight L ITB: mild tight B Piriformis: mod tight B Hip flexors: mild tight B Quads: mod tight B Heelcord: mod tight R, mild tight L  LOWER EXTREMITY ROM:  Active ROM Right eval Left eval R 09/16/23 L 09/16/23 R 09/24/23 L 09/24/23  Knee flexion 120 125 127 130 130 130  Knee extension 14 4 4  LAQ  -3 0 0   Passive ROM Right eval Left eval  Knee flexion 122 134  Knee extension 0 -8  (Blank rows = not tested)  LOWER EXTREMITY MMT:  MMT Right eval Left eval RLE LLE  Hip flexion 4+ 4+ 5 5  Hip extension 4 4 4  4+  Hip abduction 4+ 4 4 4+  Hip adduction 4- 4 4 4   Hip internal rotation 4+ 4+ 4+ 4+  Hip external rotation 3+ p! 4- 4 4  Knee flexion 4 4+ 4+ 4+  Knee extension 4- p! 4+ 5 5  Ankle dorsiflexion 5 5 5 5   Ankle plantarflexion      Ankle inversion      Ankle eversion       (Blank rows = not tested)  LOWER EXTREMITY SPECIAL TESTS:  Knee special tests: Lateral pull sign: positive B Patellofemoral grind  test: positive on R   TODAY'S  TREATMENT:  09/27/23  THERAPEUTIC EXERCISE: To improve strength.  Demonstration, verbal and tactile cues throughout for technique. NuStep- L4x5'  Standing hip abd 2x10 at counter  Standing hip ext 2x10 at counter  Seated LAQs w/ 2#- 2x10 B (knee extensions)   NEUROMUSCULAR RE-EDUCATION: To improve balance, kinesthesia, and proprioception Wall Squats w/ ball between knees 2x10  Side Stepping, 2# B 3x72ft   09/24/23 THERAPEUTIC EXERCISE: To improve strength.  Demonstration, verbal and tactile cues throughout for technique. NuStep L4 x 6'  NEUROMUSCULAR RE-EDUCATION: To improve balance, kinesthesia, and proprioception Wall squat ball btw knees x 3/10 Monster walking GTB 4 x 20' Sidestepping GTB 4 x 20'  THERAPEUTIC ACTIVITIES: To improve functional performance.  Demonstration, verbal and tactile cues throughout for technique. SL hip abd x 3/10 RLE  Prone hip ext x 3/10 RLE Prone knee flexion 2# x 3/10 RLE  09/16/23 NEUROMUSCULAR RE-EDUCATION: To improve coordination, kinesthesia, posture, and proprioception.  KT tape chondromalacia pattern for patella Lateral band walk GTB at knees 4x37ft  Monster walk GTB at knees 4x87ft Mini squat with VMO ball squeeze x 10  THERAPEUTIC EXERCISE: To improve strength, endurance, ROM, and flexibility.  Nustep L6x82min  Knee AROM measured Supine BLE SLR 2lb 2x10 S/L hip abduction 2x10 BLE 2lb   09/13/23 NEUROMUSCULAR RE-EDUCATION: To improve coordination, kinesthesia, posture, and proprioception.  KT tape chondromalacia pattern for patella Lateral band walk GTB at knees 2x47ft  Monster walk GTB at knees 2x72ft Sit to stand with VMO ball squeeze x 10 Seated R LAQ + VMO ball squeeze 2x10  THERAPEUTIC EXERCISE: To improve strength, endurance, ROM, and flexibility.  Nustep L5x5min  GAIT TRAINING: To normalize gait pattern and improve safety.  THERAPEUTIC ACTIVITIES: To improve functional performance.  MANUAL THERAPY: To promote normalized  muscle tension, improve joint mobility and/or for pain modulation   09/08/23 NEUROMUSCULAR RE-EDUCATION: To improve coordination, kinesthesia, posture, and proprioception.  KT tape for patellar tracking- 2 pieces starting from tibial tuberosity going to mid thigh Post KT tape:  Seated LAQ + VMO ball squeeze 2x10 B  Bridge + VMO ball squeeze 2x10  THERAPEUTIC EXERCISE: To improve strength, endurance, ROM, and flexibility.  Nustep L4x26min Post KT tape:  Seated HS curl GTB 2x10 B  R/L SLR + ER x 10  09/06/2023  THERAPEUTIC EXERCISE: To improve strength, endurance, and flexibility.  Demonstration, verbal and tactile cues throughout for technique.  NuStep - L4 x 6 min Supine HS stretch with strap 2 x 30 bil Supine ITB stretch with strap 2 x 30 bil Mod thomas hip flexor stretch with strap 2 x 30 bil Prone hip flexor & quad stretch with strap 2 x 30 bil Supine quad set on towel roll 10 x 5 bil  NEUROMUSCULAR RE-EDUCATION: To improve coordination and kinesthesia. B SAQ over 6 foam roll with ball squeeze between ankles 10 x 3   08/23/2023 - Eval  SELF CARE:  Reviewed eval findings and role of PT in addressing identified deficits as well as instruction in initial HEP (see below).    PATIENT EDUCATION:  Education details: HEP review and review of standing posture to avoid genu recurvatum in standing  Person educated: Patient Education method: Explanation, Demonstration, Verbal cues, and Tactile cues Education comprehension: verbalized understanding, returned demonstration, verbal cues required, tactile cues required, and needs further education  HOME EXERCISE PROGRAM: Access Code: [email or web address] URL: [email or web address] Date: 09/08/2023 Prepared by: Braylin Clark  Exercises - Supine Hamstring Stretch with  Strap  - 2-3 x daily - 7 x weekly - 3 reps - 30 sec hold - Supine Iliotibial Band Stretch with Strap  - 2-3 x daily - 7 x weekly - 3 reps - 30 sec hold - Hip Flexor  Stretch with Strap on Table  - 2-3 x daily - 7 x weekly - 3 reps - 30 sec hold - Prone Hip Flexor & Quad Stretch with Strap  - 2-3 x daily - 7 x weekly - 3 reps - 30 sec hold - Supine Quad Set on Towel Roll  - 2 x daily - 7 x weekly - 2 sets - 10 reps - 5 sec hold - Seated Long Arc Quad with Hip Adduction  - 1 x daily - 3-4 x weekly - 2 sets - 10 reps - Seated Hamstring Curl with Anchored Resistance  - 1 x daily - 3-4 x weekly - 2 sets - 10 reps - Supine Bridge with Mini Swiss Ball Between Knees  - 1 x daily - 3-4 x weekly - 2 sets - 10 reps   ASSESSMENT:  CLINICAL IMPRESSION: Pt arrived ~ 20 mins late to today's appointment so exercises were a bit limited today. She states that things are going well with her HEP and she has continued seeing her pain get better. Pt reports that she came up the stairs today and that felt good. She is still reporting that there is pain/discomfort going down stairs. Pt did not report any pain increase with today's exercises and was able to perform all parts well. Pt seemed to get most fatigued with timed marches and side stepping with 2# on ankles. 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 Veverly will benefit from PT to address deficits which still remain in R knee/RLE proprioception and weakness.    EVAL:  Jody Bryan is a 43 y.o. female who was referred to physical therapy for evaluation and treatment for B patellofemoral stress syndrome.  Patient recently completed 6 visits of PT at this clinic for low back pain from 04/08/2023 to 05/12/2023, however did not formally complete the episode as she no showed for her final scheduled visit.  She reports onset of B knee pain ~3 months ago which she attributes potentially to altering her mobility related to her low back pain.  Pain is worse with prolonged sitting or standing.  Patient has deficits in B knee ROM, B proximal LE flexibility, B LE  strength, and abnormal posture which are interfering with ADLs and are impacting quality of life.  On LEFS patient scored 27/80 demonstrating moderate functional limitation.  Awilda Vitta will benefit from skilled PT to address above deficits to improve mobility and activity tolerance with decreased pain interference.  OBJECTIVE IMPAIRMENTS: Abnormal gait, decreased activity tolerance, decreased balance, decreased coordination, decreased endurance, decreased knowledge of condition, decreased mobility, difficulty walking, decreased ROM, decreased strength, increased fascial restrictions, impaired perceived functional ability, increased muscle spasms, impaired flexibility, improper body mechanics, postural dysfunction, and pain.   ACTIVITY LIMITATIONS: lifting, sitting, standing, squatting, sleeping, stairs, transfers, bed mobility, and locomotion level  PARTICIPATION LIMITATIONS: meal prep, cleaning, laundry, driving, shopping, community activity, and occupation  PERSONAL FACTORS: Fitness, Past/current experiences, Time since onset of injury/illness/exacerbation, and 3+ comorbidities: Arthritis, LBP, GERD, PreDiabetes, obesity, depression, fatigue, dyspnea are also affecting patient's functional outcome.   REHAB POTENTIAL: Good  CLINICAL DECISION MAKING: Evolving/moderate complexity  EVALUATION COMPLEXITY:  Moderate   GOALS: Goals reviewed with patient? Yes  SHORT TERM GOALS: Target date: 09/20/2023  Patient will be independent with initial HEP. Baseline: Initial HEP provided on eval 09/06/23 - Pt returning for 1st visit since eval. Limited compliance with HEP (not able to complete strap stretches). All exercises reviewed today. Goal status: MET  - 09/24/23  2.  Patient will report at least 25% improvement in B knee pain to improve QOL. Baseline: 7/10 Goal status: MET - 09/13/23 pt reports 70% improved pain  3.  Patient will demonstrate neutral B knee alignment in stance without genu  recurvatum. Baseline:  09/16/23 - tends to lock L knee more noticeably Goal status: MET - 09/24/23 not hyperextending with gait      LONG TERM GOALS: Target date: 10/18/2023  Patient will be independent with advanced/ongoing HEP to improve outcomes and carryover.  Baseline:  Goal status: MET  2.  Patient will report at least 50-75% improvement in B knee pain to improve QOL. Baseline: 0/10 Goal status: MET  3.  Patient will demonstrate improved B knee AROM to >/= 0-125 deg to allow for normal gait and stair mechanics. Baseline: Refer to above LE ROM table Goal status: MET  09/24/23 0 degrees extension  09/16/23 please refer to AROM chart, lacking 4 deg LAQ RLE  4.  Patient will demonstrate improved B LE strength to >/= 4+/5 for improved stability and ease of mobility. Baseline: Refer to above LE MMT table Goal status: IN PROGRESS--still needs to improve on R hip strength  5.  Patient will be able to ambulate 600' with normal gait pattern without increased pain to access community.  Baseline:  Goal status: IN PROGRESS--09/24/23 still tends to hyperextend and externally rotate BLE R>L with ambulation causing pain with prolonged gait  6. Patient will be able to ascend/descend stairs with 1 HR and reciprocal step pattern safely to access home and community.  Baseline:  Goal status: IN PROGRESS 09/24/23 = ambulates 1HR reciprocally, but has poor knee positioning on descent  7.  Patient will report >/= 36/80 on LEFS (MCID = 9 pts) to demonstrate improved functional ability. Baseline: 27 / 80 = 33.8 % Goal status: MET;  09/24/23 54/80 = 67.5%   PLAN:  PT FREQUENCY: 2x/week  PT DURATION: 8 weeks  PLANNED INTERVENTIONS: 02835- PT Re-evaluation, 97750- Physical Performance Testing, 97110-Therapeutic exercises, 97530- Therapeutic activity, 97112- Neuromuscular re-education, 97535- Self Care, 02859- Manual therapy, [phone]- Gait training, [phone]- Aquatic Therapy, [phone] (1-2 muscles), [phone] (3+  muscles)- Dry Needling, Patient/Family education, Balance training, Stair training, Taping, Joint mobilization, DME instructions, Cryotherapy, and Moist heat  PLAN FOR NEXT SESSION: Continue to increase strength/proprioception in B knees in closed chain; step ups/downs; foam/Bosu balance   Latonda Larrivee, Student-PT 09/27/2023, 1:20 PM    PHYSICAL THERAPY DISCHARGE SUMMARY  Visits from Start of Care: 7  Current functional level related to goals / functional outcomes: Refer to above clinical impression and goal assessment for status as of last visit on 09/27/2023. Patient no showed for her next scheduled visit and then cancelled the next 2 visits.  She has not returned to PT in >30 days, therefore will proceed with discharge from PT for this episode.     Remaining deficits: As above. Unable to formally assess status at discharge due to failure to return to PT.    Education / Equipment: HEP   Patient agrees to discharge. Patient goals were partially met. Patient is being discharged due to not returning since the last  visit.  Elijah EMERSON Hidden, PT 02/07/2024, 4:27 PM  [facility] [address]  [address] [place], [place], [address] Phone: [phone]   Fax:  [phone]

## 2023-09-30 ENCOUNTER — Ambulatory Visit: Admitting: Rehabilitation

## 2023-10-04 ENCOUNTER — Telehealth: Payer: Self-pay | Admitting: Physical Therapy

## 2023-10-04 ENCOUNTER — Ambulatory Visit: Attending: Orthopedic Surgery | Admitting: Physical Therapy

## 2023-10-04 NOTE — Telephone Encounter (Addendum)
 PT called Jody Bryan regarding no show for today's appointment at 8:00.  Patient stated she overdid things yesterday and overslept/forgot about her appointment this morning.  Reminded patient of her next appointment on Thursday, 10/07/23 at 8:00 and requested a call to cancel if she will be unable to attend.  Patient also reminded of our cancellation and no show policy which would require us  to discharge her from PT if she were to no show for another appointment.  Elijah EMERSON Hidden, PT 10/04/2023, 8:35 AM  [facility] [address]  [address] [place], [place], [address] Phone: [phone]   Fax:  [phone]

## 2023-10-07 ENCOUNTER — Ambulatory Visit

## 2023-10-11 ENCOUNTER — Encounter: Admitting: Physical Therapy

## 2023-10-26 ENCOUNTER — Encounter (INDEPENDENT_AMBULATORY_CARE_PROVIDER_SITE_OTHER): Payer: Self-pay | Admitting: Internal Medicine

## 2023-10-26 ENCOUNTER — Ambulatory Visit (INDEPENDENT_AMBULATORY_CARE_PROVIDER_SITE_OTHER): Admitting: Internal Medicine

## 2023-10-26 VITALS — BP 138/88 | HR 80 | Temp 99.0°F | Ht 66.0 in | Wt 240.0 lb

## 2023-10-26 DIAGNOSIS — E66812 Obesity, class 2: Secondary | ICD-10-CM | POA: Diagnosis not present

## 2023-10-26 DIAGNOSIS — Z6838 Body mass index (BMI) 38.0-38.9, adult: Secondary | ICD-10-CM

## 2023-10-26 DIAGNOSIS — R7303 Prediabetes: Secondary | ICD-10-CM

## 2023-10-26 NOTE — Assessment & Plan Note (Signed)
 Weight: decrease of 31.6 lb (11.6%) over 3 years, 2 months  Start: 08/27/2020 271 lb 9.6 oz (123.2 kg)  End: 10/26/2023 240 lb (108.9 kg)  Obesity management with lifestyle modifications. Current adherence to a 1200 calorie nutrition plan is at 50%. Reports eating more whole foods, maintaining protein intake, and adequate hydration. Exercise is currently 2 days a week for 45 minutes. Lost 4 pounds, totaling 12 pounds lost. Body fat percentage decreased from 47% to 42%. Muscle mass increased, likely due to increased protein intake. Appetite control is not optimal, with difficulty in maintaining three meals a day. Discussion on the role of fiber, water, and protein in satiety and the importance of whole foods. Emphasis on the importance of exercise, particularly strengthening exercises, in managing weight and perimenopausal changes. Discussion on the cost and insurance coverage issues related to weight loss medications like Wegovy . - Encourage continuation of current nutrition plan with focus on whole foods and adequate protein intake. - Increase physical activity to 115 minutes per week, including strengthening exercises. - Use a pedometer to track daily steps, aiming for 5,000 to 10,000 steps per day. - Consider chair exercises, swimming, or water aerobics to increase activity. - Reassess in one month and consider blood work in two months to check insulin  levels.

## 2023-10-26 NOTE — Assessment & Plan Note (Signed)
 She does not have coverage for GLP-1, we will repeat hemoglobin A1c and fasting insulin  levels in 2 months, if they are still abnormal she will be started on metformin for pharmacoprophylaxis.  She continues to work on reducing simple and added sugars in her diet also saturated fats.  We also discussed the role of physical activity and insulin  resistance.

## 2023-10-26 NOTE — Progress Notes (Signed)
 Office: [phone]  /  Fax: [phone]  Weight Summary and Body Composition Analysis (BIA)  Vitals Temp: 99 F (37.2 C) BP: 138/88 Pulse Rate: 80 SpO2: 98 %   Anthropometric Measurements Height: 5' 6 (1.676 m) Weight: 240 lb (108.9 kg) BMI (Calculated): 38.76 Weight at Last Visit: 244lb Weight Lost Since Last Visit: 4lb Weight Gained Since Last Visit: 0lb Starting Weight: 252lb Total Weight Loss (lbs): 12 lb (5.443 kg) Peak Weight: 270lb   Body Composition  Body Fat %: 42.5 % Fat Mass (lbs): 102 lbs Muscle Mass (lbs): 131 lbs Total Body Water (lbs): 88.2 lbs Visceral Fat Rating : 12    RMR: 1512  Today's Visit #: 6  Starting Date: 10/28/22   Subjective   Chief Complaint: Obesity  Interval History Discussed the use of AI scribe software for clinical note transcription with the patient, who gave verbal consent to proceed.  History of Present Illness   Jody Bryan is a 43 year old female who presents for medical weight management.  She follows a 1200 calorie nutrition plan with approximately 50% adherence, without tracking her intake. She consumes more whole foods, meets protein recommendations, maintains hydration, and occasionally skips meals. She feels a lack of control over her appetite, often eating only two meals a day and not snacking frequently. When she does snack, she chooses healthier options like apples and oranges. Her meals are larger, focusing on protein and vegetables such as lettuce, tomatoes, and onions, while avoiding bread and using alternatives like shredded carrots.  She exercises twice a week for 45 minutes per session and has lost a total of 12 pounds, with a recent loss of 4 pounds. She uses waistbands and ankle weights for exercises like monster walks to strengthen her knees and continues exercises from physical therapy, noting improvement in her knee condition. She plans to start swimming lessons in September and hopes  to incorporate water aerobics into her routine. 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 She is considering using a pedometer to track her steps and increase physical activity. She stays active with her children on weekends.  She is transitioning from [place] to [employer] due to new employment.       Challenges affecting patient progress: low volume of physical activity at present .    Pharmacotherapy for weight management: She is currently taking no anti-obesity medication.   Assessment and Plan   Treatment Plan For Obesity:  Recommended Dietary Goals  Jody Bryan is currently in the action stage of change. As such, her goal is to continue weight management plan. She has agreed to: continue current plan  Behavioral Health and Counseling  We discussed the following behavioral modification strategies today: continue to work on maintaining a reduced calorie state, getting the recommended amount of protein, incorporating whole foods, making healthy choices, staying well hydrated and practicing mindfulness when eating. and increase protein intake, fibrous foods (25 grams per day for women, 30 grams for men) and water to improve satiety and decrease hunger signals. .  Additional education and resources provided today: None  Recommended Physical Activity Goals  Jody Bryan has been advised to work up to 150 minutes of moderate intensity aerobic activity a week and strengthening exercises 2-3 times per week for cardiovascular health, weight loss maintenance and preservation of muscle mass.  She has agreed to :  Think about enjoyable ways to increase daily physical activity and overcoming barriers to exercise,  Increase physical activity in their day and reduce sedentary time (increase NEAT)., Increase and monitor steps for a goal of 10,000 per day, Increase volume of physical activity to a goal of 240 minutes a week, and Combine  aerobic and strengthening exercises for efficiency and improved cardiometabolic health.  Medical Interventions and Pharmacotherapy  We discussed various medication options to help Jody Bryan with her weight loss efforts and we both agreed to : Continue with current nutritional and behavioral strategies and will look into insurance coverage for GLP-1  Associated Conditions Impacted by Obesity Treatment  Assessment & Plan Prediabetes She does not have coverage for GLP-1, we will repeat hemoglobin A1c and fasting insulin  levels in 2 months, if they are still abnormal she will be started on metformin for pharmacoprophylaxis.  She continues to work on reducing simple and added sugars in her diet also saturated fats.  We also discussed the role of physical activity and insulin  resistance.  Class 2 severe obesity with serious comorbidity and body mass index (BMI) of 38.0 to 38.9 in adult, unspecified obesity type (HCC) Weight: decrease of 31.6 lb (11.6%) over 3 years, 2 months  Start: 08/27/2020 271 lb 9.6 oz (123.2 kg)  End: 10/26/2023 240 lb (108.9 kg)  Obesity management with lifestyle modifications. Current adherence to a 1200 calorie nutrition plan is at 50%. Reports eating more whole foods, maintaining protein intake, and adequate hydration. Exercise is currently 2 days a week for 45 minutes. Lost 4 pounds, totaling 12 pounds lost. Body fat percentage decreased from 47% to 42%. Muscle mass increased, likely due to increased protein intake. Appetite control is not optimal, with difficulty in maintaining three meals a day. Discussion on the role of fiber, water, and protein in satiety and the importance of whole foods. Emphasis on the importance of exercise, particularly strengthening exercises, in managing weight and perimenopausal changes. Discussion on the cost and insurance coverage issues related to weight loss medications like Wegovy . - Encourage continuation of current nutrition plan with focus on  whole foods and adequate protein intake. - Increase physical activity to 115 minutes per week, including strengthening exercises. - Use a pedometer to track daily steps, aiming for 5,000 to 10,000 steps per day. - Consider chair exercises, swimming, or water aerobics to increase activity. - Reassess in one month and consider blood work in two months to check insulin  levels.           Objective   Physical Exam:  Blood pressure 138/88, pulse 80, temperature 99 F (37.2 C), height 5' 6 (1.676 m), weight 240 lb (108.9 kg), SpO2 98%. Body mass index is 38.74 kg/m.  General: She is overweight, cooperative, alert, well developed, and in no acute distress. PSYCH: Has normal mood, affect and thought process.   HEENT: EOMI, sclerae are anicteric. Lungs: Normal breathing effort, no conversational dyspnea. Extremities: No edema.  Neurologic: No gross sensory or motor deficits. No tremors or fasciculations noted.    Diagnostic Data Reviewed:  BMET    Component Value Date/Time   NA 141 05/27/2023 1443   NA 142 10/28/2022 0925   K 4.4 05/27/2023 1443   CL 106 05/27/2023 1443   CO2 27 05/27/2023 1443   GLUCOSE 77 05/27/2023 1443   BUN 14 05/27/2023 1443   BUN 13 10/28/2022 0925   CREATININE 0.73 05/27/2023 1443   CREATININE 0.82 06/08/2014 1627   CALCIUM 9.3 05/27/2023 1443   GFRNONAA >60 08/27/2020 0745   GFRAA 133 02/22/2020 1039   Lab Results  Component  Value Date   HGBA1C 5.9 05/27/2023   HGBA1C 5.7 06/11/2015   Lab Results  Component Value Date   INSULIN  11.6 10/28/2022   INSULIN  25.7 (H) 03/17/2017   Lab Results  Component Value Date   TSH 1.45 05/27/2023   CBC    Component Value Date/Time   WBC 11.1 (H) 05/27/2023 1443   RBC 4.65 05/27/2023 1443   HGB 14.2 05/27/2023 1443   HGB 12.4 06/19/2020 0829   HCT 42.8 05/27/2023 1443   HCT 36.2 06/19/2020 0829   PLT 364.0 05/27/2023 1443   PLT 355 06/19/2020 0829   MCV 92.2 05/27/2023 1443   MCV 94 06/19/2020  0829   MCH 33.0 08/27/2020 0745   MCHC 33.2 05/27/2023 1443   RDW 13.4 05/27/2023 1443   RDW 12.9 06/19/2020 0829   Iron Studies No results found for: IRON, TIBC, FERRITIN, IRONPCTSAT Lipid Panel     Component Value Date/Time   CHOL 173 05/18/2022 1543   CHOL 156 03/17/2017 1042   TRIG 127.0 05/18/2022 1543   HDL 61.70 05/18/2022 1543   HDL 47 03/17/2017 1042   CHOLHDL 3 05/18/2022 1543   VLDL 25.4 05/18/2022 1543   LDLCALC 86 05/18/2022 1543   LDLCALC 94 03/17/2017 1042   Hepatic Function Panel     Component Value Date/Time   PROT 6.8 05/27/2023 1443   PROT 7.0 10/28/2022 0925   ALBUMIN 4.1 05/27/2023 1443   ALBUMIN 4.4 10/28/2022 0925   AST 12 05/27/2023 1443   ALT 12 05/27/2023 1443   ALKPHOS 76 05/27/2023 1443   BILITOT 0.6 05/27/2023 1443   BILITOT 0.8 10/28/2022 0925   BILIDIR 0.2 06/11/2015 1024   IBILI 0.6 06/08/2014 1627      Component Value Date/Time   TSH 1.45 05/27/2023 1443   Nutritional Lab Results  Component Value Date   VD25OH 23.57 (L) 05/27/2023   VD25OH 17.44 (L) 05/18/2022   VD25OH 24.61 (L) 08/12/2021    Medications: Outpatient Encounter Medications as of 10/26/2023  Medication Sig   Cholecalciferol (VITAMIN D -3) 25 MCG (1000 UT) CAPS Take by mouth.   levonorgestrel  (MIRENA ) 20 MCG/DAY IUD by Intrauterine route.   Vitamin D , Ergocalciferol , (DRISDOL ) 1.25 MG (50000 UNIT) CAPS capsule TAKE 1 CAPSULE (50,000 UNITS TOTAL) BY MOUTH EVERY 7 (SEVEN) DAYS   buPROPion  (WELLBUTRIN  SR) 150 MG 12 hr tablet Take 1 tablet (150 mg total) by mouth in the morning.   Melatonin 10 MG TABS Take by mouth. (Patient not taking: Reported on 10/26/2023)   meloxicam  (MOBIC ) 15 MG tablet Take 1 tablet (15 mg total) by mouth daily. (Patient not taking: Reported on 10/26/2023)   No facility-administered encounter medications on file as of 10/26/2023.     Follow-Up   Return in about 4 weeks (around 11/23/2023) for For Weight Mangement with Dr. [email or web address] She  was informed of the importance of frequent follow up visits to maximize her success with intensive lifestyle modifications for her multiple health conditions.  Attestation Statement   Reviewed by clinician on day of visit: allergies, medications, problem list, medical history, surgical history, family history, social history, and previous encounter notes.   I have spent 30 minutes in the care of the patient today including: 3 minutes before the visit reviewing and preparing the chart. 22 minutes face-to-face assessing and reviewing listed medical problems as outlined in obesity care plan, providing nutritional and behavioral counseling on topics outlined in the obesity care plan, independently interpreting test results and goals of care, as described in  assessment and plan, and reviewing and discussing biometric information and progress 5 minutes after the visit updating chart and documentation of encounter.     Lucas Parker, MD

## 2023-11-15 ENCOUNTER — Ambulatory Visit: Admitting: Family Medicine

## 2023-11-15 DIAGNOSIS — M9913 Subluxation complex (vertebral) of lumbar region: Secondary | ICD-10-CM | POA: Diagnosis not present

## 2023-11-15 DIAGNOSIS — M9914 Subluxation complex (vertebral) of sacral region: Secondary | ICD-10-CM | POA: Diagnosis not present

## 2023-11-15 DIAGNOSIS — M9915 Subluxation complex (vertebral) of pelvic region: Secondary | ICD-10-CM | POA: Diagnosis not present

## 2023-11-16 ENCOUNTER — Ambulatory Visit: Admitting: Family

## 2023-11-16 VITALS — BP 130/76 | HR 87 | Temp 99.1°F | Resp 16 | Ht 66.0 in | Wt 246.0 lb

## 2023-11-16 DIAGNOSIS — R5383 Other fatigue: Secondary | ICD-10-CM

## 2023-11-16 DIAGNOSIS — E559 Vitamin D deficiency, unspecified: Secondary | ICD-10-CM | POA: Diagnosis not present

## 2023-11-16 DIAGNOSIS — N912 Amenorrhea, unspecified: Secondary | ICD-10-CM | POA: Diagnosis not present

## 2023-11-16 DIAGNOSIS — L680 Hirsutism: Secondary | ICD-10-CM | POA: Diagnosis not present

## 2023-11-16 LAB — ESTRADIOL: Estradiol: 216 pg/mL

## 2023-11-16 NOTE — Progress Notes (Unsigned)
 Subjective:     Patient ID: Jody Bryan, female    DOB: [birth year], 43 y.o.   MRN: [id number]  Chief Complaint  Patient presents with   Fatigue    Patient reports feeling more tire than normal   Mood  changes    HPI  Discussed the use of AI scribe software for clinical note transcription with the patient, who gave verbal consent to proceed.  History of Present Illness Jody Bryan is a 43 year old female who presents with fatigue and mood swings.  She experiences significant fatigue and mood swings, feeling tired and unmotivated despite 8 to 9 hours of sleep per night. She denies snoring, and a previous sleep apnea test was negative. She elevates her head during sleep and uses melatonin.  She takes a weekly vitamin D  supplement due to previously low levels. Her last lab work in March showed a slightly elevated white blood cell count, with normal kidney and liver function.  She discontinued Wellbutrin  over the summer due to inconsistent use and lack of improvement in energy levels. She prefers to manage her symptoms without medication.  Emotional stressors include being a single mother, recent job loss, and the loss of her father. She feels overwhelmed and lacks a social support network. Previous counseling sessions were not helpful.     11/16/2023    1:08 PM 05/28/2022    8:45 AM 08/14/2020    9:20 AM  PHQ9 SCORE ONLY  PHQ-9 Total Score 5 0  5      Data saved with a previous flowsheet row definition       Health Maintenance Due  Topic Date Due   Hepatitis B Vaccines 19-59 Average Risk (1 of 3 - 19+ 3-dose series) Never done   HPV VACCINES (1 - 3-dose SCDM series) Never done   Influenza Vaccine  10/01/2023   COVID-19 Vaccine (3 - 2025-26 season) 11/01/2023    Past Medical History:  Diagnosis Date   Arthritis    Bilateral leg edema    Chicken pox    Depression    Drug use    Dyspnea    Low back pain     Past Surgical History:  Procedure  Laterality Date   WISDOM TOOTH EXTRACTION  08/2019    Family History  Problem Relation Age of Onset   Hypertension Mother    Obesity Mother    Depression Mother    Hypertension Father    Depression Father    Sleep apnea Father    Drug abuse Father    Obesity Father    Cancer Father    Alcohol abuse Sister    Stroke Sister    Colon polyps Paternal Aunt    Heart disease Maternal Grandmother    Alcohol abuse Maternal Grandfather    Arthritis Paternal Grandmother    Cancer Paternal Grandmother        breast   Colon cancer Neg Hx    Esophageal cancer Neg Hx    Rectal cancer Neg Hx    Stomach cancer Neg Hx     Social History   Socioeconomic History   Marital status: Single    Spouse name: Not on file   Number of children: 1   Years of education: Not on file   Highest education level: Bachelor's degree (e.g., BA, AB, BS)  Occupational History   Occupation:  call center   Occupation: Stay at home  Tobacco Use   Smoking status: Former    Types:  Cigarettes   Smokeless tobacco: Never  Vaping Use   Vaping status: Never Used  Substance and Sexual Activity   Alcohol use: Yes    Alcohol/week: 0.0 standard drinks of alcohol    Comment: social   Drug use: Not Currently   Sexual activity: Yes    Birth control/protection: I.U.D.  Other Topics Concern   Not on file  Social History Narrative   Exercise ---  5 days a week for 40 min   Social Drivers of Health   Financial Resource Strain: Low Risk  (03/28/2023)   Overall Financial Resource Strain (CARDIA)    Difficulty of Paying Living Expenses: Not very hard  Food Insecurity: No Food Insecurity (03/28/2023)   Hunger Vital Sign    Worried About Running Out of Food in the Last Year: Never true    Ran Out of Food in the Last Year: Never true  Transportation Needs: No Transportation Needs (03/28/2023)   PRAPARE - Administrator, Civil Service (Medical): No    Lack of Transportation (Non-Medical): No  Physical  Activity: Unknown (03/28/2023)   Exercise Vital Sign    Days of Exercise per Week: 0 days    Minutes of Exercise per Session: Not on file  Stress: Stress Concern Present (03/28/2023)   [employer] of Occupational Health - Occupational Stress Questionnaire    Feeling of Stress : Very much  Social Connections: Moderately Isolated (03/28/2023)   Social Connection and Isolation Panel    Frequency of Communication with Friends and Family: More than three times a week    Frequency of Social Gatherings with Friends and Family: Never    Attends Religious Services: More than 4 times per year    Active Member of [employer] or Organizations: No    Attends Engineer, structural: Not on file    Marital Status: Never married  Intimate Partner Violence: Not on file    Outpatient Medications Prior to Visit  Medication Sig Dispense Refill   levonorgestrel  (MIRENA ) 20 MCG/DAY IUD by Intrauterine route.     Cholecalciferol (VITAMIN D -3) 25 MCG (1000 UT) CAPS Take by mouth.     Vitamin D , Ergocalciferol , (DRISDOL ) 1.25 MG (50000 UNIT) CAPS capsule TAKE 1 CAPSULE (50,000 UNITS TOTAL) BY MOUTH EVERY 7 (SEVEN) DAYS 12 capsule 1   buPROPion  (WELLBUTRIN  SR) 150 MG 12 hr tablet Take 1 tablet (150 mg total) by mouth in the morning. 30 tablet 0   Melatonin 10 MG TABS Take by mouth. (Patient not taking: Reported on 10/26/2023)     meloxicam  (MOBIC ) 15 MG tablet Take 1 tablet (15 mg total) by mouth daily. (Patient not taking: Reported on 10/26/2023) 30 tablet 0   No facility-administered medications prior to visit.    No Known Allergies  ROS    See HPI Objective:    Physical Exam Constitutional:      General: She is not in acute distress.    Appearance: Normal appearance. She is well-developed.  HENT:     Head: Normocephalic and atraumatic.     Right Ear: External ear normal.     Left Ear: External ear normal.  Eyes:     General: No scleral icterus. Neck:     Thyroid : No thyromegaly.   Cardiovascular:     Rate and Rhythm: Normal rate and regular rhythm.     Heart sounds: Normal heart sounds. No murmur heard. Pulmonary:     Effort: Pulmonary effort is normal. No respiratory distress.  Breath sounds: Normal breath sounds. No wheezing.  Musculoskeletal:     Cervical back: Neck supple.  Skin:    General: Skin is warm and dry.  Neurological:     Mental Status: She is alert and oriented to person, place, and time.  Psychiatric:        Mood and Affect: Mood normal.        Behavior: Behavior normal.        Thought Content: Thought content normal.        Judgment: Judgment normal.      BP 130/76 (BP Location: Right Arm, Patient Position: Sitting, Cuff Size: Normal)   Pulse 87   Temp 99.1 F (37.3 C) (Oral)   Resp 16   Ht 5' 6 (1.676 m)   Wt 246 lb (111.6 kg)   SpO2 99%   BMI 39.71 kg/m  Wt Readings from Last 3 Encounters:  11/16/23 246 lb (111.6 kg)  10/26/23 240 lb (108.9 kg)  08/23/23 244 lb (110.7 kg)       Assessment & Plan:   Problem List Items Addressed This Visit       Unprioritized   Vitamin D  deficiency - Primary    Previously diagnosed with vitamin D  deficiency, currently on otc 1000 international units supplementation. Vitamin D  level was low in March. - Order vitamin D  level to assess current status. - Consider switching to 3,000 IU daily if levels are stable.      Relevant Orders   Vitamin D  (25 hydroxy) (Completed)   Hirsutism    Increased facial hair growth possibly due to hormonal imbalance. Differential includes PCOS. - Order testosterone  level. - Consider referral to GYN if hormonal imbalance is confirmed.      Relevant Orders   Testosterone  (Completed)   Fatigue    Chronic fatigue and mood disturbances possibly due to hormonal changes and/or stress. Reports hx of negative sleep apnea test. She is reluctant to be labeled as depressed but acknowledges significant stressors. - Order estradiol  and testosterone  levels. -  Provide pamphlet for counseling services. - Follow up with PCP in 3 months.      Other Visit Diagnoses       Amenorrhea       Relevant Orders   Estradiol  (Completed)       I have discontinued Yassmine Gruel Vitta's meloxicam , Melatonin, and buPROPion . I am also having her maintain her levonorgestrel .  No orders of the defined types were placed in this encounter.

## 2023-11-17 ENCOUNTER — Other Ambulatory Visit: Payer: Self-pay | Admitting: Family

## 2023-11-17 ENCOUNTER — Ambulatory Visit: Payer: Self-pay | Admitting: Family

## 2023-11-17 DIAGNOSIS — E559 Vitamin D deficiency, unspecified: Secondary | ICD-10-CM

## 2023-11-17 LAB — VITAMIN D 25 HYDROXY (VIT D DEFICIENCY, FRACTURES): VITD: 30.45 ng/mL (ref 30.00–100.00)

## 2023-11-17 LAB — TESTOSTERONE: Testosterone: 18.94 ng/dL (ref 15.00–40.00)

## 2023-11-17 MED ORDER — VITAMIN D3 75 MCG (3000 UT) PO TABS: 1.0000 | ORAL_TABLET | Freq: Every day | ORAL | Status: AC

## 2023-11-18 DIAGNOSIS — L680 Hirsutism: Secondary | ICD-10-CM | POA: Insufficient documentation

## 2023-11-18 NOTE — Patient Instructions (Signed)
 VISIT SUMMARY:  During your visit, we discussed your ongoing fatigue, mood swings, and other health concerns. We have planned some tests and follow-up actions to better understand and manage your symptoms.  YOUR PLAN:  FATIGUE AND MOOD DISTURBANCE: You are experiencing significant fatigue and mood swings, possibly due to hormonal changes or stress. -We will check your estradiol  and testosterone  levels. -I have provided you with a pamphlet for counseling services. -Please follow up with Doctor Loud in 3 months.  HIRSUTISM AND POSSIBLE HORMONAL IMBALANCE: You have noticed increased facial hair growth, which may be due to a hormonal imbalance. -We will check your testosterone  level. -If a hormonal imbalance is confirmed, we may refer you to a gynecologist.  VITAMIN D  DEFICIENCY: You have a history of low vitamin D  levels and are currently taking a supplement. -We will check your vitamin D  level to see where it stands now. -If your levels are stable, we may switch you to 3,000 IU of vitamin D  daily.

## 2023-11-18 NOTE — Assessment & Plan Note (Signed)
  Chronic fatigue and mood disturbances possibly due to hormonal changes and/or stress. Reports hx of negative sleep apnea test. She is reluctant to be labeled as depressed but acknowledges significant stressors. - Order estradiol  and testosterone  levels. - Provide pamphlet for counseling services. - Follow up with PCP in 3 months.

## 2023-11-18 NOTE — Assessment & Plan Note (Signed)
  Increased facial hair growth possibly due to hormonal imbalance. Differential includes PCOS. - Order testosterone  level. - Consider referral to GYN if hormonal imbalance is confirmed.

## 2023-11-18 NOTE — Assessment & Plan Note (Signed)
  Previously diagnosed with vitamin D  deficiency, currently on otc 1000 international units supplementation. Vitamin D  level was low in March. - Order vitamin D  level to assess current status. - Consider switching to 3,000 IU daily if levels are stable.

## 2023-11-23 ENCOUNTER — Ambulatory Visit: Admitting: Family Medicine

## 2023-11-23 ENCOUNTER — Other Ambulatory Visit: Payer: Self-pay | Admitting: Family Medicine

## 2023-11-23 DIAGNOSIS — Z1231 Encounter for screening mammogram for malignant neoplasm of breast: Secondary | ICD-10-CM

## 2023-12-06 ENCOUNTER — Ambulatory Visit (HOSPITAL_BASED_OUTPATIENT_CLINIC_OR_DEPARTMENT_OTHER)
Admission: RE | Admit: 2023-12-06 | Discharge: 2023-12-06 | Disposition: A | Discharge status: Home / Self Care | Source: Ambulatory Visit | Attending: Family Medicine | Admitting: Family Medicine

## 2023-12-06 ENCOUNTER — Encounter (HOSPITAL_BASED_OUTPATIENT_CLINIC_OR_DEPARTMENT_OTHER): Payer: Self-pay

## 2023-12-06 DIAGNOSIS — Z1231 Encounter for screening mammogram for malignant neoplasm of breast: Secondary | ICD-10-CM | POA: Insufficient documentation

## 2023-12-09 ENCOUNTER — Encounter (INDEPENDENT_AMBULATORY_CARE_PROVIDER_SITE_OTHER): Payer: Self-pay | Admitting: Internal Medicine

## 2023-12-09 ENCOUNTER — Ambulatory Visit (INDEPENDENT_AMBULATORY_CARE_PROVIDER_SITE_OTHER): Admitting: Internal Medicine

## 2023-12-09 VITALS — BP 140/83 | HR 74 | Temp 98.6°F | Ht 66.0 in | Wt 241.0 lb

## 2023-12-09 DIAGNOSIS — Z6838 Body mass index (BMI) 38.0-38.9, adult: Secondary | ICD-10-CM | POA: Diagnosis not present

## 2023-12-09 DIAGNOSIS — R7303 Prediabetes: Secondary | ICD-10-CM

## 2023-12-09 DIAGNOSIS — R948 Abnormal results of function studies of other organs and systems: Secondary | ICD-10-CM

## 2023-12-09 DIAGNOSIS — I10 Essential (primary) hypertension: Secondary | ICD-10-CM | POA: Diagnosis not present

## 2023-12-09 DIAGNOSIS — R030 Elevated blood-pressure reading, without diagnosis of hypertension: Secondary | ICD-10-CM

## 2023-12-09 DIAGNOSIS — E66812 Obesity, class 2: Secondary | ICD-10-CM

## 2023-12-09 MED ORDER — AMLODIPINE BESYLATE 2.5 MG PO TABS: 2.5000 mg | ORAL_TABLET | Freq: Every day | ORAL | 0 refills | Status: DC

## 2023-12-09 MED ORDER — METFORMIN HCL ER 500 MG PO TB24: 500.0000 mg | ORAL_TABLET | Freq: Every day | ORAL | 0 refills | Status: DC

## 2023-12-09 NOTE — Progress Notes (Signed)
 Office: [phone]  /  Fax: [phone]  Weight Summary and Body Composition Analysis (BIA)  Vitals Temp: 98.6 F (37 C) BP: (!) 140/83 Pulse Rate: 74 SpO2: 98 %   Anthropometric Measurements Height: 5' 6 (1.676 m) Weight: 241 lb (109.3 kg) BMI (Calculated): 38.92 Weight at Last Visit: 240 lb Weight Lost Since Last Visit: 0 Weight Gained Since Last Visit: 1 lb Starting Weight: 252 lb Total Weight Loss (lbs): 11 lb (4.99 kg) Peak Weight: 270 lb   Body Composition  Body Fat %: 44 % Fat Mass (lbs): 106.2 lbs Muscle Mass (lbs): 128.2 lbs Total Body Water (lbs): 90.2 lbs Visceral Fat Rating : 12    No data recorded Today's Visit #: 7  Starting Date: 10/28/22   Subjective   Chief Complaint: Obesity  Interval History Discussed the use of AI scribe software for clinical note transcription with the patient, who gave verbal consent to proceed.  History of Present Illness Jody Bryan is a 43 year old female with hypertension and prediabetes who presents for weight management and blood pressure control.  Since last office visit she has gained 1 pound reports following a 1200-calorie nutrition plan 70% of the time she is exercising 5 days a week 45 minutes walking.  She has experienced elevated blood pressure readings during recent visits, despite being previously informed by another provider that there were no issues. She owns a home blood pressure machine but has not used it since her pregnancy. Her blood pressure readings have been consistently high during recent visits. No consumption of coffee or energy drinks this morning.  She has a history of elevated blood sugar during pregnancy, which was managed through daily monitoring and dietary changes. Her most recent A1c was 5.9, indicating prediabetes. She will be started on metformin for blood sugar control.  She is interested in weight management and has been walking 45 minutes five days a week. She is  cautious about exercise due to concerns about her back and is exploring low-impact exercises.  Her family history is significant for hypertension and kidney disease. Her sister has been on dialysis for ten years, and her youngest sister, father, and mother also have hypertension.     Challenges affecting patient progress: none.    Pharmacotherapy for weight management: She is currently taking no anti-obesity medication.   Assessment and Plan   Treatment Plan For Obesity:  Recommended Dietary Goals  Jody Bryan is currently in the action stage of change. As such, her goal is to continue weight management plan. She has agreed to: continue current plan  Behavioral Health and Counseling  We discussed the following behavioral modification strategies today: continue to work on maintaining a reduced calorie state, getting the recommended amount of protein, incorporating whole foods, making healthy choices, staying well hydrated and practicing mindfulness when eating. and increase protein intake, fibrous foods (25 grams per day for women, 30 grams for men) and water to improve satiety and decrease hunger signals. .  Additional education and resources provided today: None  Recommended Physical Activity Goals  Jody Bryan has been advised to work up to 150 minutes of moderate intensity aerobic activity a week and strengthening exercises 2-3 times per week for cardiovascular health, weight loss maintenance and preservation of muscle mass.  She has agreed to :  Think about enjoyable ways to increase daily physical activity and overcoming barriers to exercise, Increase physical activity in their day and reduce sedentary time (increase NEAT)., Increase volume of physical activity to a  goal of 240 minutes a week, and Combine aerobic and strengthening exercises for efficiency and improved cardiometabolic health. Continue metformin Medical Interventions and Pharmacotherapy  We discussed various  medication options to help Jody Bryan with her weight loss efforts and we both agreed to : Continue metformin XR 500 mg once a day  Associated Conditions Impacted by Obesity Treatment  Assessment & Plan Class 2 severe obesity with serious comorbidity and body mass index (BMI) of 38.0 to 38.9 in adult, unspecified obesity type Weight: decrease of 18.6 lb (7.2%) over 6 months, 1 week  Start: 05/27/2023 259 lb 9.6 oz (117.8 kg)  End: 12/09/2023 241 lb (109.3 kg)  Weight has been maintained since the last visit. Insurance coverage challenges for weight loss medications like Wegovy  were discussed, emphasizing the need for long-term use for effective results. Metformin introduced as a weight management aid due to its appetite-reducing effects and safety profile. - Start metformin for weight management and prediabetes - Encourage reduction of carbohydrate intake to aid weight loss - Recommend continuation of walking 45 minutes, 5 days a week - Suggest incorporating strengthening exercises to boost metabolic rate - Advise exploring low-impact exercises to avoid back strain Prediabetes She does not have coverage for GLP-1, we will repeat hemoglobin A1c and fasting insulin  levels today.  She continues to work on reducing simple and added sugars in her diet also saturated fats.  We also discussed the role of physical activity and insulin  resistance.  She will be started on metformin 500 mg once daily for pharmacoprophylaxis  Abnormal metabolism Patient has a slower than predicted metabolism. IC 1512 vs. calculated 1948. This may contribute to weight gain, chronic fatigue and difficulty losing weight.   We reviewed measures to improve metabolism including not skipping meals, progressive strengthening exercises, increasing protein intake at every meal and maintaining adequate hydration and sleep.   Primary hypertension BP blood pressure was 150/89.  Consistently elevated blood pressure readings. Emphasis on  aggressive management to prevent complications such as stroke, kidney disease, and heart failure, particularly in minority groups. Amlodipine chosen for its efficacy in African American patients. - Start amlodipine for blood pressure management - Monitor blood pressure at home - Educate on potential side effects of amlodipine, including dizziness if blood pressure drops too low         Objective   Physical Exam:  Blood pressure (!) 140/83, pulse 74, temperature 98.6 F (37 C), height 5' 6 (1.676 m), weight 241 lb (109.3 kg), SpO2 98%. Body mass index is 38.9 kg/m.  General: She is overweight, cooperative, alert, well developed, and in no acute distress. PSYCH: Has normal mood, affect and thought process.   HEENT: EOMI, sclerae are anicteric. Lungs: Normal breathing effort, no conversational dyspnea. Extremities: No edema.  Neurologic: No gross sensory or motor deficits. No tremors or fasciculations noted.    Diagnostic Data Reviewed:  BMET    Component Value Date/Time   NA 141 05/27/2023 1443   NA 142 10/28/2022 0925   K 4.4 05/27/2023 1443   CL 106 05/27/2023 1443   CO2 27 05/27/2023 1443   GLUCOSE 77 05/27/2023 1443   BUN 14 05/27/2023 1443   BUN 13 10/28/2022 0925   CREATININE 0.73 05/27/2023 1443   CREATININE 0.82 06/08/2014 1627   CALCIUM 9.3 05/27/2023 1443   GFRNONAA >60 08/27/2020 0745   GFRAA 133 02/22/2020 1039   Lab Results  Component Value Date   HGBA1C 5.9 05/27/2023   HGBA1C 5.7 06/11/2015   Lab Results  Component Value Date   INSULIN  11.6 10/28/2022   INSULIN  25.7 (H) 03/17/2017   Lab Results  Component Value Date   TSH 1.45 05/27/2023   CBC    Component Value Date/Time   WBC 11.1 (H) 05/27/2023 1443   RBC 4.65 05/27/2023 1443   HGB 14.2 05/27/2023 1443   HGB 12.4 06/19/2020 0829   HCT 42.8 05/27/2023 1443   HCT 36.2 06/19/2020 0829   PLT 364.0 05/27/2023 1443   PLT 355 06/19/2020 0829   MCV 92.2 05/27/2023 1443   MCV 94  06/19/2020 0829   MCH 33.0 08/27/2020 0745   MCHC 33.2 05/27/2023 1443   RDW 13.4 05/27/2023 1443   RDW 12.9 06/19/2020 0829   Iron Studies No results found for: IRON, TIBC, FERRITIN, IRONPCTSAT Lipid Panel     Component Value Date/Time   CHOL 173 05/18/2022 1543   CHOL 156 03/17/2017 1042   TRIG 127.0 05/18/2022 1543   HDL 61.70 05/18/2022 1543   HDL 47 03/17/2017 1042   CHOLHDL 3 05/18/2022 1543   VLDL 25.4 05/18/2022 1543   LDLCALC 86 05/18/2022 1543   LDLCALC 94 03/17/2017 1042   Hepatic Function Panel     Component Value Date/Time   PROT 6.8 05/27/2023 1443   PROT 7.0 10/28/2022 0925   ALBUMIN 4.1 05/27/2023 1443   ALBUMIN 4.4 10/28/2022 0925   AST 12 05/27/2023 1443   ALT 12 05/27/2023 1443   ALKPHOS 76 05/27/2023 1443   BILITOT 0.6 05/27/2023 1443   BILITOT 0.8 10/28/2022 0925   BILIDIR 0.2 06/11/2015 1024   IBILI 0.6 06/08/2014 1627      Component Value Date/Time   TSH 1.45 05/27/2023 1443   Nutritional Lab Results  Component Value Date   VD25OH 30.45 11/16/2023   VD25OH 23.57 (L) 05/27/2023   VD25OH 17.44 (L) 05/18/2022    Medications: Outpatient Encounter Medications as of 12/09/2023  Medication Sig   amLODipine (NORVASC) 2.5 MG tablet Take 1 tablet (2.5 mg total) by mouth daily.   Cholecalciferol (VITAMIN D3) 75 MCG (3000 UT) TABS Take 1 tablet by mouth daily at 6 (six) AM.   levonorgestrel  (MIRENA ) 20 MCG/DAY IUD by Intrauterine route.   metFORMIN (GLUCOPHAGE-XR) 500 MG 24 hr tablet Take 1 tablet (500 mg total) by mouth daily with breakfast.   No facility-administered encounter medications on file as of 12/09/2023.     Follow-Up   Return in about 4 weeks (around 01/06/2024) for For Weight Mangement with Dr. Francyne.SABRA She was informed of the importance of frequent follow up visits to maximize her success with intensive lifestyle modifications for her multiple health conditions.  Attestation Statement   Reviewed by clinician on day of  visit: allergies, medications, problem list, medical history, surgical history, family history, social history, and previous encounter notes.     Lucas Francyne, MD

## 2023-12-09 NOTE — Assessment & Plan Note (Signed)
 She does not have coverage for GLP-1, we will repeat hemoglobin A1c and fasting insulin  levels today.  She continues to work on reducing simple and added sugars in her diet also saturated fats.  We also discussed the role of physical activity and insulin  resistance.  She will be started on metformin 500 mg once daily for pharmacoprophylaxis

## 2023-12-09 NOTE — Assessment & Plan Note (Addendum)
 Weight: decrease of 18.6 lb (7.2%) over 6 months, 1 week  Start: 05/27/2023 259 lb 9.6 oz (117.8 kg)  End: 12/09/2023 241 lb (109.3 kg)  Weight has been maintained since the last visit. Insurance coverage challenges for weight loss medications like Wegovy  were discussed, emphasizing the need for long-term use for effective results. Metformin introduced as a weight management aid due to its appetite-reducing effects and safety profile. - Start metformin for weight management and prediabetes - Encourage reduction of carbohydrate intake to aid weight loss - Recommend continuation of walking 45 minutes, 5 days a week - Suggest incorporating strengthening exercises to boost metabolic rate - Advise exploring low-impact exercises to avoid back strain

## 2023-12-09 NOTE — Assessment & Plan Note (Signed)
Patient has a slower than predicted metabolism. IC 1512 vs. calculated 1948. This may contribute to weight gain, chronic fatigue and difficulty losing weight.  We reviewed measures to improve metabolism including not skipping meals, progressive strengthening exercises, increasing protein intake at every meal and maintaining adequate hydration and sleep.

## 2023-12-10 LAB — HEMOGLOBIN A1C
Est. average glucose Bld gHb Est-mCnc: 114 mg/dL
Hgb A1c MFr Bld: 5.6 % (ref 4.8–5.6)

## 2023-12-10 LAB — LIPID PANEL WITH LDL/HDL RATIO
Cholesterol, Total: 163 mg/dL (ref 100–199)
HDL: 56 mg/dL (ref >39)
LDL Chol Calc (NIH): 97 mg/dL (ref 0–99)
LDL/HDL Ratio: 1.7 ratio (ref 0.0–3.2)
Triglycerides: 48 mg/dL (ref 0–149)
VLDL Cholesterol Cal: 10 mg/dL (ref 5–40)

## 2023-12-10 LAB — CMP14+EGFR
ALT: 24 IU/L (ref 0–32)
AST: 21 IU/L (ref 0–40)
Albumin: 4.2 g/dL (ref 3.9–4.9)
Alkaline Phosphatase: 93 IU/L (ref 41–116)
BUN/Creatinine Ratio: 23 (ref 9–23)
BUN: 17 mg/dL (ref 6–24)
Bilirubin Total: 0.4 mg/dL (ref 0.0–1.2)
CO2: 21 mmol/L (ref 20–29)
Calcium: 9.5 mg/dL (ref 8.7–10.2)
Chloride: 106 mmol/L (ref 96–106)
Creatinine, Ser: 0.73 mg/dL (ref 0.57–1.00)
Globulin, Total: 2.4 g/dL (ref 1.5–4.5)
Glucose: 106 mg/dL — ABNORMAL HIGH (ref 70–99)
Potassium: 4.7 mmol/L (ref 3.5–5.2)
Sodium: 142 mmol/L (ref 134–144)
Total Protein: 6.6 g/dL (ref 6.0–8.5)
eGFR: 103 mL/min/1.73 (ref >59)

## 2023-12-10 LAB — INSULIN, RANDOM: INSULIN: 23.6 u[IU]/mL (ref 2.6–24.9)

## 2023-12-13 ENCOUNTER — Ambulatory Visit (INDEPENDENT_AMBULATORY_CARE_PROVIDER_SITE_OTHER): Payer: Self-pay | Admitting: Internal Medicine

## 2024-01-05 ENCOUNTER — Ambulatory Visit (INDEPENDENT_AMBULATORY_CARE_PROVIDER_SITE_OTHER): Payer: Self-pay | Admitting: Internal Medicine

## 2024-01-05 ENCOUNTER — Encounter (INDEPENDENT_AMBULATORY_CARE_PROVIDER_SITE_OTHER): Payer: Self-pay | Admitting: Internal Medicine

## 2024-01-05 VITALS — BP 138/84 | HR 76 | Temp 98.6°F | Ht 66.0 in | Wt 245.0 lb

## 2024-01-05 DIAGNOSIS — Z6838 Body mass index (BMI) 38.0-38.9, adult: Secondary | ICD-10-CM | POA: Diagnosis not present

## 2024-01-05 DIAGNOSIS — R948 Abnormal results of function studies of other organs and systems: Secondary | ICD-10-CM

## 2024-01-05 DIAGNOSIS — E66812 Obesity, class 2: Secondary | ICD-10-CM

## 2024-01-05 DIAGNOSIS — I10 Essential (primary) hypertension: Secondary | ICD-10-CM

## 2024-01-05 DIAGNOSIS — R7303 Prediabetes: Secondary | ICD-10-CM | POA: Diagnosis not present

## 2024-01-05 MED ORDER — METFORMIN HCL ER 500 MG PO TB24: 500.0000 mg | ORAL_TABLET | Freq: Two times a day (BID) | ORAL | 1 refills | Status: DC

## 2024-01-05 MED ORDER — AMLODIPINE BESYLATE 2.5 MG PO TABS
2.5000 mg | ORAL_TABLET | Freq: Every day | ORAL | 0 refills | Status: AC
Start: 2024-01-05 — End: ?

## 2024-01-05 NOTE — Assessment & Plan Note (Signed)
 Obesity with abnormal metabolism and prediabetes Obesity with abnormal metabolism and prediabetes. Low adherence to a 1000 calorie nutrition plan and lack of exercise have resulted in a 4-pound weight gain. Currently on metformin 500 mg once daily for diabetes prevention and weight management. Discussed the impact of high carbohydrate intake on insulin  levels and weight gain. Emphasized the importance of maintaining a calorie intake under 1500 calories to prevent weight gain and the benefits of exercise in increasing metabolism and reducing inflammation. Discussed the potential use of Victoza as a future option for weight management, noting its cost and availability. Highlighted the importance of finding personal motivation ('why') for weight management and the need to avoid an all-or-nothing mindset. - Increased metformin to 500 mg twice daily, once with breakfast and once with dinner. - Use meal replacement shakes with fiber for breakfast. - Explore meal prep options for lunch and dinner. - Utilize calorie tracking apps like My Fitness Pal, lose it or My Net Diary. - Engage in regular physical activity, including 45-minute walks in the morning and 30-minute walks in the afternoon. - Will reassess weight management plan in 6 weeks.

## 2024-01-05 NOTE — Assessment & Plan Note (Signed)
Patient has a slower than predicted metabolism. IC 1512 vs. calculated 1948. This may contribute to weight gain, chronic fatigue and difficulty losing weight.  We reviewed measures to improve metabolism including not skipping meals, progressive strengthening exercises, increasing protein intake at every meal and maintaining adequate hydration and sleep.

## 2024-01-05 NOTE — Progress Notes (Signed)
 Office: [phone]  /  Fax: [phone]  Weight Summary and Body Composition Analysis (BIA)  Vitals Temp: 98.6 F (37 C) BP: 138/84 Pulse Rate: 76 SpO2: 97 %   Anthropometric Measurements Height: 5' 6 (1.676 m) Weight: 245 lb (111.1 kg) BMI (Calculated): 39.56 Weight at Last Visit: 241 lb Weight Lost Since Last Visit: 0 lb Weight Gained Since Last Visit: 4 lb Starting Weight: 252 lb Total Weight Loss (lbs): 7 lb (3.175 kg) Peak Weight: 270 lb   Body Composition  Body Fat %: 44.9 % Fat Mass (lbs): 110.2 lbs Muscle Mass (lbs): 128.4 lbs Total Body Water (lbs): 90.8 lbs Visceral Fat Rating : 12    No data recorded Today's Visit #: 8  Starting Date: 10/08/22   Subjective   Chief Complaint: Obesity  Interval History Discussed the use of AI scribe software for clinical note transcription with the patient, who gave verbal consent to proceed.  History of Present Illness Dalylah Sharay Bellissimo is a 43 year old female with prediabetes who presents for medical weight management.  She has experienced a recent weight gain of four pounds, which she attributes to low adherence to a 1000-calorie nutrition plan and lack of exercise. She has recently resumed exercising, incorporating 45-minute walks in the morning and 30 minutes in the afternoon, but this routine only began this week.  She is currently taking metformin 500 mg once daily for diabetes prevention and weight management, which she started a week after her last visit. She also takes a blood pressure medication, initiated two days after her last visit. No issues with loose stools or other side effects from the metformin are reported.  She mentions having a 'major sweet tooth' and indulging in foods she should not have, which she attributes to a relapse in her dietary habits. She finds it challenging to maintain her diet and exercise routine, especially after starting a new job this month.  She has a history of  gestational diabetes, which is relevant to her current prediabetes management.  She has been trying to ensure adequate sleep and hydration despite her dietary challenges. She has not been tracking her food intake recently but did so during her gestational diabetes period.     Challenges affecting patient progress: difficulty implementing reduced calorie nutrition plan, low volume of physical activity at present , all-or- none mindset, and new employment.    Pharmacotherapy for weight management: She is currently taking Metformin (off label use for weight management and / or insulin  resistance and / or diabetes prevention) with adequate clinical response  and without side effects..   Assessment and Plan   Treatment Plan For Obesity:  Recommended Dietary Goals  Zamora is currently in the action stage of change. As such, her goal is to continue weight management plan. She has agreed to: keep a food journal with a target of  1000 calories per day and 75 to 90 g. and continue current plan  Behavioral Health and Counseling  We discussed the following behavioral modification strategies today: work on meal planning and preparation, work on tracking and journaling calories using tracking application, continue to work on maintaining a reduced calorie state, getting the recommended amount of protein, incorporating whole foods, making healthy choices, staying well hydrated and practicing mindfulness when eating., increase protein intake, fibrous foods (25 grams per day for women, 30 grams for men) and water to improve satiety and decrease hunger signals. , getting back on track after recent relapse, avoid all or none thinking,  display self-compassion, and rethink your why .  Additional education and resources provided today: Handout and personalized instruction on tracking and journaling using Apps  Recommended Physical Activity Goals  Sameka has been advised to work up to 150 minutes of moderate  intensity aerobic activity a week and strengthening exercises 2-3 times per week for cardiovascular health, weight loss maintenance and preservation of muscle mass.  She has agreed to :  Think about enjoyable ways to increase daily physical activity and overcoming barriers to exercise, Increase physical activity in their day and reduce sedentary time (increase NEAT)., Increase volume of physical activity to a goal of 240 minutes a week, and Combine aerobic and strengthening exercises for efficiency and improved cardiometabolic health.  Medical Interventions and Pharmacotherapy  We discussed various medication options to help Abigaelle with her weight loss efforts and we both agreed to : Increase metformin XR to 500 mg twice daily  Associated Conditions Impacted by Obesity Treatment  Assessment & Plan Primary hypertension Blood pressure is better but not at goal.  She will continue on amlodipine at the current dose we will consider further adjustments.  She has had a relapse and lifestyle changes and will be working on reimplementation of these. Class 2 severe obesity with serious comorbidity and body mass index (BMI) of 38.0 to 38.9 in adult, unspecified obesity type Prediabetes Obesity with abnormal metabolism and prediabetes Obesity with abnormal metabolism and prediabetes. Low adherence to a 1000 calorie nutrition plan and lack of exercise have resulted in a 4-pound weight gain. Currently on metformin 500 mg once daily for diabetes prevention and weight management. Discussed the impact of high carbohydrate intake on insulin  levels and weight gain. Emphasized the importance of maintaining a calorie intake under 1500 calories to prevent weight gain and the benefits of exercise in increasing metabolism and reducing inflammation. Discussed the potential use of Victoza as a future option for weight management, noting its cost and availability. Highlighted the importance of finding personal motivation  ('why') for weight management and the need to avoid an all-or-nothing mindset. - Increased metformin to 500 mg twice daily, once with breakfast and once with dinner. - Use meal replacement shakes with fiber for breakfast. - Explore meal prep options for lunch and dinner. - Utilize calorie tracking apps like My Fitness Pal, lose it or My Net Diary. - Engage in regular physical activity, including 45-minute walks in the morning and 30-minute walks in the afternoon. - Will reassess weight management plan in 6 weeks. Abnormal metabolism Patient has a slower than predicted metabolism. IC 1512 vs. calculated 1948. This may contribute to weight gain, chronic fatigue and difficulty losing weight.   We reviewed measures to improve metabolism including not skipping meals, progressive strengthening exercises, increasing protein intake at every meal and maintaining adequate hydration and sleep.           Objective   Physical Exam:  Blood pressure 138/84, pulse 76, temperature 98.6 F (37 C), height 5' 6 (1.676 m), weight 245 lb (111.1 kg), SpO2 97%. Body mass index is 39.54 kg/m.  General: She is overweight, cooperative, alert, well developed, and in no acute distress. PSYCH: Has normal mood, affect and thought process.   HEENT: EOMI, sclerae are anicteric. Lungs: Normal breathing effort, no conversational dyspnea. Extremities: No edema.  Neurologic: No gross sensory or motor deficits. No tremors or fasciculations noted.    Diagnostic Data Reviewed:  BMET    Component Value Date/Time   NA 142 12/09/2023 0817   K  4.7 12/09/2023 0817   CL 106 12/09/2023 0817   CO2 21 12/09/2023 0817   GLUCOSE 106 (H) 12/09/2023 0817   GLUCOSE 77 05/27/2023 1443   BUN 17 12/09/2023 0817   CREATININE 0.73 12/09/2023 0817   CREATININE 0.82 06/08/2014 1627   CALCIUM 9.5 12/09/2023 0817   GFRNONAA >60 08/27/2020 0745   GFRAA 133 02/22/2020 1039   Lab Results  Component Value Date   HGBA1C 5.6  12/09/2023   HGBA1C 5.7 06/11/2015   Lab Results  Component Value Date   INSULIN  23.6 12/09/2023   INSULIN  25.7 (H) 03/17/2017   Lab Results  Component Value Date   TSH 1.45 05/27/2023   CBC    Component Value Date/Time   WBC 11.1 (H) 05/27/2023 1443   RBC 4.65 05/27/2023 1443   HGB 14.2 05/27/2023 1443   HGB 12.4 06/19/2020 0829   HCT 42.8 05/27/2023 1443   HCT 36.2 06/19/2020 0829   PLT 364.0 05/27/2023 1443   PLT 355 06/19/2020 0829   MCV 92.2 05/27/2023 1443   MCV 94 06/19/2020 0829   MCH 33.0 08/27/2020 0745   MCHC 33.2 05/27/2023 1443   RDW 13.4 05/27/2023 1443   RDW 12.9 06/19/2020 0829   Iron Studies No results found for: IRON, TIBC, FERRITIN, IRONPCTSAT Lipid Panel     Component Value Date/Time   CHOL 163 12/09/2023 0817   TRIG 48 12/09/2023 0817   HDL 56 12/09/2023 0817   CHOLHDL 3 05/18/2022 1543   VLDL 25.4 05/18/2022 1543   LDLCALC 97 12/09/2023 0817   Hepatic Function Panel     Component Value Date/Time   PROT 6.6 12/09/2023 0817   ALBUMIN 4.2 12/09/2023 0817   AST 21 12/09/2023 0817   ALT 24 12/09/2023 0817   ALKPHOS 93 12/09/2023 0817   BILITOT 0.4 12/09/2023 0817   BILIDIR 0.2 06/11/2015 1024   IBILI 0.6 06/08/2014 1627      Component Value Date/Time   TSH 1.45 05/27/2023 1443   Nutritional Lab Results  Component Value Date   VD25OH 30.45 11/16/2023   VD25OH 23.57 (L) 05/27/2023   VD25OH 17.44 (L) 05/18/2022    Medications: Outpatient Encounter Medications as of 01/05/2024  Medication Sig   Cholecalciferol (VITAMIN D3) 75 MCG (3000 UT) TABS Take 1 tablet by mouth daily at 6 (six) AM.   levonorgestrel  (MIRENA ) 20 MCG/DAY IUD by Intrauterine route.   [DISCONTINUED] amLODipine (NORVASC) 2.5 MG tablet Take 1 tablet (2.5 mg total) by mouth daily.   [DISCONTINUED] metFORMIN (GLUCOPHAGE-XR) 500 MG 24 hr tablet Take 1 tablet (500 mg total) by mouth daily with breakfast.   amLODipine (NORVASC) 2.5 MG tablet Take 1 tablet (2.5 mg  total) by mouth daily.   metFORMIN (GLUCOPHAGE-XR) 500 MG 24 hr tablet Take 1 tablet (500 mg total) by mouth 2 (two) times daily with a meal.   No facility-administered encounter medications on file as of 01/05/2024.     Follow-Up   Return in about 4 weeks (around 02/02/2024) for For Weight Mangement with Dr. Francyne.SABRA She was informed of the importance of frequent follow up visits to maximize her success with intensive lifestyle modifications for her multiple health conditions.  Attestation Statement   Reviewed by clinician on day of visit: allergies, medications, problem list, medical history, surgical history, family history, social history, and previous encounter notes.     Lucas Francyne, MD

## 2024-01-05 NOTE — Assessment & Plan Note (Signed)
 Blood pressure is better but not at goal.  She will continue on amlodipine at the current dose we will consider further adjustments.  She has had a relapse and lifestyle changes and will be working on reimplementation of these.

## 2024-01-31 ENCOUNTER — Ambulatory Visit (INDEPENDENT_AMBULATORY_CARE_PROVIDER_SITE_OTHER): Admitting: Internal Medicine

## 2024-03-07 ENCOUNTER — Ambulatory Visit (INDEPENDENT_AMBULATORY_CARE_PROVIDER_SITE_OTHER): Admitting: Internal Medicine

## 2024-03-07 ENCOUNTER — Encounter (INDEPENDENT_AMBULATORY_CARE_PROVIDER_SITE_OTHER): Payer: Self-pay | Admitting: Internal Medicine

## 2024-03-07 VITALS — BP 158/91 | HR 81 | Temp 99.0°F | Ht 66.0 in | Wt 251.0 lb

## 2024-03-07 DIAGNOSIS — R948 Abnormal results of function studies of other organs and systems: Secondary | ICD-10-CM

## 2024-03-07 DIAGNOSIS — E66812 Obesity, class 2: Secondary | ICD-10-CM

## 2024-03-07 DIAGNOSIS — Z6838 Body mass index (BMI) 38.0-38.9, adult: Secondary | ICD-10-CM

## 2024-03-07 DIAGNOSIS — R7303 Prediabetes: Secondary | ICD-10-CM

## 2024-03-07 DIAGNOSIS — I10 Essential (primary) hypertension: Secondary | ICD-10-CM

## 2024-03-07 MED ORDER — METFORMIN HCL ER 500 MG PO TB24: 500.0000 mg | ORAL_TABLET | Freq: Two times a day (BID) | ORAL | 1 refills | Status: AC

## 2024-03-07 NOTE — Assessment & Plan Note (Signed)
 Gestational diabetes, increasing risk of developing diabetes. Currently not taking metformin , which was prescribed for diabetes prevention. Discussed the importance of metformin  in preventing diabetes and the need for consistent medication adherence. - Sent metformin  prescription to pharmacy. - Resume metformin  as prescribed.

## 2024-03-07 NOTE — Assessment & Plan Note (Signed)
 The patient expresses a desire to lose weight but is currently not ready to initiate consistent behavior change.  They acknowledge the importance of weight management and have reported difficulty implementing or sustaining lifestyle modifications motivational emotion or situational barriers.   Obesity with a slow metabolic rate, contributing to difficulty in weight management. Recent weight gain of six pounds. Fair adherence to a low-carb, 1200 calorie nutrition plan but no recent exercise. Emotional and situational factors impacting motivation and adherence. Discussed the importance of long-term lifestyle changes and the role of exercise in weight management. Emphasized the need for a sustainable approach to weight loss, including small, gradual changes. Discussed potential use of weight loss medications, including Wegovy , but noted insurance coverage uncertainties and the need for adherence to lifestyle changes for approval. Highlighted the importance of understanding personal motivation for weight loss and setting realistic goals. - Increase water intake. - Start walking, aiming for at least 5000 steps per day. - Consider prepackaged meals or protein shakes to simplify meal preparation. - Focus on one or two healthy meals per day. - Monitor progress and adjust goals as needed.

## 2024-03-07 NOTE — Assessment & Plan Note (Signed)
 Elevated blood pressure. Previously managed with amlodipine , but currently not taking it. Discussed the importance of blood pressure management to reduce risks of stroke, kidney disease, and heart failure, especially in African American populations. - Restart amlodipine  as previously prescribed. - Follow up with PCP for ongoing blood pressure management.

## 2024-03-07 NOTE — Progress Notes (Signed)
 "  Office: [phone]  /  Fax: [phone]  Weight Summary and Body Composition Analysis (BIA)  Vitals Temp: 99 F (37.2 C) BP: (!) 158/91 Pulse Rate: 81 SpO2: 100 %   Anthropometric Measurements Height: 5' 6 (1.676 m) Weight: 251 lb (113.9 kg) BMI (Calculated): 40.53 Weight at Last Visit: 245 lb Weight Lost Since Last Visit: 0 lb Weight Gained Since Last Visit: 6 lb Starting Weight: 252 lb Total Weight Loss (lbs): 0 lb (0 kg) Peak Weight: 270 lb   Body Composition  Body Fat %: 46.5 % Fat Mass (lbs): 117 lbs Muscle Mass (lbs): 127.8 lbs Total Body Water (lbs): 95.4 lbs Visceral Fat Rating : 13    RMR: 1512  Today's Visit #: 9  Starting Date: 10/28/22   Subjective   Chief Complaint: Obesity  Interval History  Discussed the use of AI scribe software for clinical note transcription with the patient, who gave verbal consent to proceed.  History of Present Illness Jody Bryan is a 44 year old female with obesity, hypertension, and prediabetes who presents for medical weight management. She was referred by her primary care physician, Dr. Cyndee, for weight management.  She has experienced a recent weight gain of six pounds despite following a low-carb, 1200 calorie nutrition plan with fair adherence. She has not been engaging in regular exercise, and the anniversary of her father's passing has significantly impacted her emotionally, affecting her motivation to maintain her exercise and meal prepping routines.  Her sleep schedule is inconsistent, with only two hours of sleep the previous night. She sometimes wakes up early in the morning to eat, further disrupting her sleep. She is considering ways to improve her sleep and exercise routine given her busy schedule with work and family responsibilities.  She has a history of gestational diabetes, which increases her risk of developing diabetes. Currently prediabetic, she was prescribed metformin  to  prevent diabetes but stopped taking it on December 15th.  She is managing hypertension and has been prescribed amlodipine .  In terms of social history, she works full-time and has a busy schedule with her children, impacting her ability to find time for exercise and meal preparation. She is exploring ways to incorporate more physical activity into her daily routine, such as walking after dinner.     Challenges affecting patient progress: having difficulty with meal prep and planning, having difficulty focusing on healthy eating, difficulty implementing reduced calorie nutrition plan, low volume of physical activity at present , and all-or- none mindset.    Pharmacotherapy for weight management: She is currently taking Metformin  (off label use for weight management and / or insulin  resistance and / or diabetes prevention) with adequate clinical response  and without side effects..   Assessment and Plan   Treatment Plan For Obesity:  Recommended Dietary Goals  [place] is currently in the action stage of change. As such, her goal is to continue weight management plan. She has agreed to: continue current plan and continue to work on implementation of reduced calorie nutrition plan (RCNP)  Behavioral Health and Counseling  We discussed the following behavioral modification strategies today: increasing lean protein intake to established goals, decreasing simple carbohydrates , increasing vegetables, increasing lower glycemic fruits, increasing fiber rich foods, avoiding skipping meals, increasing water intake , work on meal planning and preparation, and avoid all-or-none thinking.  Additional education and resources provided today: None  Recommended Physical Activity Goals  Amarie has been advised to work up to 150 minutes of moderate intensity  aerobic activity a week and strengthening exercises 2-3 times per week for cardiovascular health, weight loss maintenance and preservation of  muscle mass.  She has agreed to :  Think about enjoyable ways to increase daily physical activity and overcoming barriers to exercise and Increase physical activity in their day and reduce sedentary time (increase NEAT).  Medical Interventions and Pharmacotherapy  We discussed various medication options to help Camara with her weight loss efforts and we both agreed to : Continue with current nutritional and behavioral strategies  Associated Conditions Impacted by Obesity Treatment  Assessment & Plan Primary hypertension Elevated blood pressure. Previously managed with amlodipine , but currently not taking it. Discussed the importance of blood pressure management to reduce risks of stroke, kidney disease, and heart failure, especially in African American populations. - Restart amlodipine  as previously prescribed. - Follow up with PCP for ongoing blood pressure management. Class 2 severe obesity with serious comorbidity and body mass index (BMI) of 38.0 to 38.9 in adult, unspecified obesity type Abnormal metabolism The patient expresses a desire to lose weight but is currently not ready to initiate consistent behavior change.  They acknowledge the importance of weight management and have reported difficulty implementing or sustaining lifestyle modifications motivational emotion or situational barriers.   Obesity with a slow metabolic rate, contributing to difficulty in weight management. Recent weight gain of six pounds. Fair adherence to a low-carb, 1200 calorie nutrition plan but no recent exercise. Emotional and situational factors impacting motivation and adherence. Discussed the importance of long-term lifestyle changes and the role of exercise in weight management. Emphasized the need for a sustainable approach to weight loss, including small, gradual changes. Discussed potential use of weight loss medications, including Wegovy , but noted insurance coverage uncertainties and the need for  adherence to lifestyle changes for approval. Highlighted the importance of understanding personal motivation for weight loss and setting realistic goals. - Increase water intake. - Start walking, aiming for at least 5000 steps per day. - Consider prepackaged meals or protein shakes to simplify meal preparation. - Focus on one or two healthy meals per day. - Monitor progress and adjust goals as needed. Prediabetes Gestational diabetes, increasing risk of developing diabetes. Currently not taking metformin , which was prescribed for diabetes prevention. Discussed the importance of metformin  in preventing diabetes and the need for consistent medication adherence. - Sent metformin  prescription to pharmacy. - Resume metformin  as prescribed.        Objective   Physical Exam:  Blood pressure (!) 158/91, pulse 81, temperature 99 F (37.2 C), height 5' 6 (1.676 m), weight 251 lb (113.9 kg), SpO2 100%. Body mass index is 40.51 kg/m.  General: She is overweight, cooperative, alert, well developed, and in no acute distress. PSYCH: Has normal mood, affect and thought process.   HEENT: EOMI, sclerae are anicteric. Lungs: Normal breathing effort, no conversational dyspnea. Extremities: No edema.  Neurologic: No gross sensory or motor deficits. No tremors or fasciculations noted.    Diagnostic Data Reviewed:  BMET    Component Value Date/Time   NA 142 12/09/2023 0817   K 4.7 12/09/2023 0817   CL 106 12/09/2023 0817   CO2 21 12/09/2023 0817   GLUCOSE 106 (H) 12/09/2023 0817   GLUCOSE 77 05/27/2023 1443   BUN 17 12/09/2023 0817   CREATININE 0.73 12/09/2023 0817   CREATININE 0.82 06/08/2014 1627   CALCIUM 9.5 12/09/2023 0817   GFRNONAA >60 08/27/2020 0745   GFRAA 133 02/22/2020 1039   Lab Results  Component Value  Date   HGBA1C 5.6 12/09/2023   HGBA1C 5.7 06/11/2015   Lab Results  Component Value Date   INSULIN  23.6 12/09/2023   INSULIN  25.7 (H) 03/17/2017   Lab Results   Component Value Date   TSH 1.45 05/27/2023   CBC    Component Value Date/Time   WBC 11.1 (H) 05/27/2023 1443   RBC 4.65 05/27/2023 1443   HGB 14.2 05/27/2023 1443   HGB 12.4 06/19/2020 0829   HCT 42.8 05/27/2023 1443   HCT 36.2 06/19/2020 0829   PLT 364.0 05/27/2023 1443   PLT 355 06/19/2020 0829   MCV 92.2 05/27/2023 1443   MCV 94 06/19/2020 0829   MCH 33.0 08/27/2020 0745   MCHC 33.2 05/27/2023 1443   RDW 13.4 05/27/2023 1443   RDW 12.9 06/19/2020 0829   Iron Studies No results found for: IRON, TIBC, FERRITIN, IRONPCTSAT Lipid Panel     Component Value Date/Time   CHOL 163 12/09/2023 0817   TRIG 48 12/09/2023 0817   HDL 56 12/09/2023 0817   CHOLHDL 3 05/18/2022 1543   VLDL 25.4 05/18/2022 1543   LDLCALC 97 12/09/2023 0817   Hepatic Function Panel     Component Value Date/Time   PROT 6.6 12/09/2023 0817   ALBUMIN 4.2 12/09/2023 0817   AST 21 12/09/2023 0817   ALT 24 12/09/2023 0817   ALKPHOS 93 12/09/2023 0817   BILITOT 0.4 12/09/2023 0817   BILIDIR 0.2 06/11/2015 1024   IBILI 0.6 06/08/2014 1627      Component Value Date/Time   TSH 1.45 05/27/2023 1443   Nutritional Lab Results  Component Value Date   VD25OH 30.45 11/16/2023   VD25OH 23.57 (L) 05/27/2023   VD25OH 17.44 (L) 05/18/2022    Medications: Outpatient Encounter Medications as of 03/07/2024  Medication Sig   Cholecalciferol (VITAMIN D3) 75 MCG (3000 UT) TABS Take 1 tablet by mouth daily at 6 (six) AM.   levonorgestrel  (MIRENA ) 20 MCG/DAY IUD by Intrauterine route.   amLODipine  (NORVASC ) 2.5 MG tablet Take 1 tablet (2.5 mg total) by mouth daily. (Patient not taking: Reported on 03/07/2024)   metFORMIN  (GLUCOPHAGE -XR) 500 MG 24 hr tablet Take 1 tablet (500 mg total) by mouth 2 (two) times daily with a meal. (Patient not taking: Reported on 03/07/2024)   No facility-administered encounter medications on file as of 03/07/2024.     Follow-Up   No follow-ups on file.SABRA She was informed of the  importance of frequent follow up visits to maximize her success with intensive lifestyle modifications for her multiple health conditions.  Attestation Statement   Reviewed by clinician on day of visit: allergies, medications, problem list, medical history, surgical history, family history, social history, and previous encounter notes.     Lucas Parker, MD  "

## 2024-04-10 ENCOUNTER — Ambulatory Visit (INDEPENDENT_AMBULATORY_CARE_PROVIDER_SITE_OTHER): Admitting: Internal Medicine

## 2024-05-31 ENCOUNTER — Ambulatory Visit (INDEPENDENT_AMBULATORY_CARE_PROVIDER_SITE_OTHER): Admitting: Internal Medicine
